# Patient Record
Sex: Female | Born: 1935 | Race: White | Hispanic: No | Smoking: Never smoker
Health system: Southern US, Community
[De-identification: ages and names within clinical notes are randomized; demographics above are authoritative.]

## PROBLEM LIST (undated history)

## (undated) DIAGNOSIS — Z86010 Personal history of colonic polyps: Secondary | ICD-10-CM

## (undated) DIAGNOSIS — K8689 Other specified diseases of pancreas: Secondary | ICD-10-CM

## (undated) DIAGNOSIS — M810 Age-related osteoporosis without current pathological fracture: Secondary | ICD-10-CM

## (undated) DIAGNOSIS — H269 Unspecified cataract: Secondary | ICD-10-CM

## (undated) DIAGNOSIS — E785 Hyperlipidemia, unspecified: Secondary | ICD-10-CM

## (undated) DIAGNOSIS — I8390 Asymptomatic varicose veins of unspecified lower extremity: Secondary | ICD-10-CM

## (undated) DIAGNOSIS — H348192 Central retinal vein occlusion, unspecified eye, stable: Secondary | ICD-10-CM

## (undated) DIAGNOSIS — I10 Essential (primary) hypertension: Secondary | ICD-10-CM

## (undated) DIAGNOSIS — R011 Cardiac murmur, unspecified: Secondary | ICD-10-CM

## (undated) DIAGNOSIS — Z808 Family history of malignant neoplasm of other organs or systems: Secondary | ICD-10-CM

## (undated) DIAGNOSIS — R0609 Other forms of dyspnea: Secondary | ICD-10-CM

## (undated) DIAGNOSIS — R0600 Dyspnea, unspecified: Secondary | ICD-10-CM

## (undated) DIAGNOSIS — M1990 Unspecified osteoarthritis, unspecified site: Secondary | ICD-10-CM

## (undated) DIAGNOSIS — I350 Nonrheumatic aortic (valve) stenosis: Secondary | ICD-10-CM

## (undated) DIAGNOSIS — Z803 Family history of malignant neoplasm of breast: Secondary | ICD-10-CM

## (undated) DIAGNOSIS — C44310 Basal cell carcinoma of skin of unspecified parts of face: Secondary | ICD-10-CM

## (undated) DIAGNOSIS — E559 Vitamin D deficiency, unspecified: Secondary | ICD-10-CM

## (undated) DIAGNOSIS — Z800 Family history of malignant neoplasm of digestive organs: Secondary | ICD-10-CM

## (undated) DIAGNOSIS — H9192 Unspecified hearing loss, left ear: Secondary | ICD-10-CM

## (undated) DIAGNOSIS — I421 Obstructive hypertrophic cardiomyopathy: Secondary | ICD-10-CM

## (undated) HISTORY — PX: VAGINAL HYSTERECTOMY: SUR661

## (undated) HISTORY — DX: Other specified diseases of pancreas: K86.89

## (undated) HISTORY — DX: Unspecified hearing loss, left ear: H91.92

## (undated) HISTORY — DX: Central retinal vein occlusion, unspecified eye, stable: H34.8192

## (undated) HISTORY — PX: SHOULDER SURGERY: SHX246

## (undated) HISTORY — DX: Essential (primary) hypertension: I10

## (undated) HISTORY — DX: Vitamin D deficiency, unspecified: E55.9

## (undated) HISTORY — DX: Nonrheumatic aortic (valve) stenosis: I35.0

## (undated) HISTORY — DX: Asymptomatic varicose veins of unspecified lower extremity: I83.90

## (undated) HISTORY — PX: ELBOW SURGERY: SHX618

## (undated) HISTORY — DX: Basal cell carcinoma of skin of unspecified parts of face: C44.310

## (undated) HISTORY — DX: Family history of malignant neoplasm of digestive organs: Z80.0

## (undated) HISTORY — DX: Family history of malignant neoplasm of other organs or systems: Z80.8

## (undated) HISTORY — PX: EYE SURGERY: SHX253

## (undated) HISTORY — DX: Family history of malignant neoplasm of breast: Z80.3

---

## 1972-09-21 HISTORY — PX: EXTERNAL EAR SURGERY: SHX627

## 1986-09-21 HISTORY — PX: COLONOSCOPY W/ POLYPECTOMY: SHX1380

## 2000-04-21 HISTORY — PX: COLONOSCOPY: SHX174

## 2002-08-28 ENCOUNTER — Other Ambulatory Visit: Admission: RE | Admit: 2002-08-28 | Discharge: 2002-08-28 | Payer: Self-pay | Admitting: Family Medicine

## 2004-05-08 ENCOUNTER — Encounter: Admission: RE | Admit: 2004-05-08 | Discharge: 2004-07-21 | Payer: Self-pay | Admitting: *Deleted

## 2006-05-18 ENCOUNTER — Other Ambulatory Visit: Admission: RE | Admit: 2006-05-18 | Discharge: 2006-05-18 | Payer: Self-pay | Admitting: Family Medicine

## 2007-02-21 ENCOUNTER — Ambulatory Visit: Payer: Self-pay | Admitting: Internal Medicine

## 2009-01-15 ENCOUNTER — Ambulatory Visit: Payer: Self-pay | Admitting: Internal Medicine

## 2009-03-18 ENCOUNTER — Ambulatory Visit: Payer: Self-pay | Admitting: Unknown Physician Specialty

## 2010-01-07 ENCOUNTER — Ambulatory Visit: Payer: Self-pay | Admitting: Unknown Physician Specialty

## 2010-01-13 ENCOUNTER — Ambulatory Visit: Payer: Self-pay | Admitting: Unknown Physician Specialty

## 2010-02-06 ENCOUNTER — Ambulatory Visit: Payer: Self-pay | Admitting: Internal Medicine

## 2011-02-10 ENCOUNTER — Ambulatory Visit: Payer: Self-pay | Admitting: Internal Medicine

## 2011-05-11 ENCOUNTER — Ambulatory Visit: Payer: Self-pay | Admitting: Otolaryngology

## 2011-05-20 ENCOUNTER — Ambulatory Visit: Payer: Self-pay | Admitting: Otolaryngology

## 2011-09-24 DIAGNOSIS — H35359 Cystoid macular degeneration, unspecified eye: Secondary | ICD-10-CM | POA: Diagnosis not present

## 2011-09-25 DIAGNOSIS — IMO0002 Reserved for concepts with insufficient information to code with codable children: Secondary | ICD-10-CM | POA: Diagnosis not present

## 2011-09-25 DIAGNOSIS — R079 Chest pain, unspecified: Secondary | ICD-10-CM | POA: Diagnosis not present

## 2011-10-20 DIAGNOSIS — H35359 Cystoid macular degeneration, unspecified eye: Secondary | ICD-10-CM | POA: Diagnosis not present

## 2011-10-27 DIAGNOSIS — D235 Other benign neoplasm of skin of trunk: Secondary | ICD-10-CM | POA: Diagnosis not present

## 2011-10-27 DIAGNOSIS — D1801 Hemangioma of skin and subcutaneous tissue: Secondary | ICD-10-CM | POA: Diagnosis not present

## 2011-11-17 DIAGNOSIS — H3560 Retinal hemorrhage, unspecified eye: Secondary | ICD-10-CM | POA: Diagnosis not present

## 2011-11-17 DIAGNOSIS — E119 Type 2 diabetes mellitus without complications: Secondary | ICD-10-CM | POA: Diagnosis not present

## 2011-11-17 DIAGNOSIS — H35359 Cystoid macular degeneration, unspecified eye: Secondary | ICD-10-CM | POA: Diagnosis not present

## 2011-12-01 DIAGNOSIS — H35359 Cystoid macular degeneration, unspecified eye: Secondary | ICD-10-CM | POA: Diagnosis not present

## 2011-12-03 DIAGNOSIS — H9209 Otalgia, unspecified ear: Secondary | ICD-10-CM | POA: Diagnosis not present

## 2011-12-10 DIAGNOSIS — H348192 Central retinal vein occlusion, unspecified eye, stable: Secondary | ICD-10-CM | POA: Diagnosis not present

## 2012-01-20 DIAGNOSIS — M810 Age-related osteoporosis without current pathological fracture: Secondary | ICD-10-CM | POA: Diagnosis not present

## 2012-01-20 DIAGNOSIS — I10 Essential (primary) hypertension: Secondary | ICD-10-CM | POA: Diagnosis not present

## 2012-01-20 DIAGNOSIS — E785 Hyperlipidemia, unspecified: Secondary | ICD-10-CM | POA: Diagnosis not present

## 2012-01-20 DIAGNOSIS — M129 Arthropathy, unspecified: Secondary | ICD-10-CM | POA: Diagnosis not present

## 2012-01-25 DIAGNOSIS — E785 Hyperlipidemia, unspecified: Secondary | ICD-10-CM | POA: Diagnosis not present

## 2012-01-26 DIAGNOSIS — H348192 Central retinal vein occlusion, unspecified eye, stable: Secondary | ICD-10-CM | POA: Diagnosis not present

## 2012-01-26 DIAGNOSIS — H35359 Cystoid macular degeneration, unspecified eye: Secondary | ICD-10-CM | POA: Diagnosis not present

## 2012-02-02 DIAGNOSIS — N951 Menopausal and female climacteric states: Secondary | ICD-10-CM | POA: Diagnosis not present

## 2012-02-11 ENCOUNTER — Ambulatory Visit: Payer: Self-pay | Admitting: Internal Medicine

## 2012-02-11 DIAGNOSIS — Z1231 Encounter for screening mammogram for malignant neoplasm of breast: Secondary | ICD-10-CM | POA: Diagnosis not present

## 2012-02-11 DIAGNOSIS — R928 Other abnormal and inconclusive findings on diagnostic imaging of breast: Secondary | ICD-10-CM | POA: Diagnosis not present

## 2012-03-10 DIAGNOSIS — H35359 Cystoid macular degeneration, unspecified eye: Secondary | ICD-10-CM | POA: Diagnosis not present

## 2012-03-10 DIAGNOSIS — H348192 Central retinal vein occlusion, unspecified eye, stable: Secondary | ICD-10-CM | POA: Diagnosis not present

## 2012-04-12 DIAGNOSIS — H35359 Cystoid macular degeneration, unspecified eye: Secondary | ICD-10-CM | POA: Diagnosis not present

## 2012-05-24 DIAGNOSIS — H348192 Central retinal vein occlusion, unspecified eye, stable: Secondary | ICD-10-CM | POA: Diagnosis not present

## 2012-05-24 DIAGNOSIS — H35359 Cystoid macular degeneration, unspecified eye: Secondary | ICD-10-CM | POA: Diagnosis not present

## 2012-06-24 DIAGNOSIS — H348192 Central retinal vein occlusion, unspecified eye, stable: Secondary | ICD-10-CM | POA: Diagnosis not present

## 2012-06-24 DIAGNOSIS — H35359 Cystoid macular degeneration, unspecified eye: Secondary | ICD-10-CM | POA: Diagnosis not present

## 2012-07-25 DIAGNOSIS — I10 Essential (primary) hypertension: Secondary | ICD-10-CM | POA: Diagnosis not present

## 2012-07-25 DIAGNOSIS — H35359 Cystoid macular degeneration, unspecified eye: Secondary | ICD-10-CM | POA: Diagnosis not present

## 2012-08-02 DIAGNOSIS — I10 Essential (primary) hypertension: Secondary | ICD-10-CM | POA: Diagnosis not present

## 2012-08-02 DIAGNOSIS — E785 Hyperlipidemia, unspecified: Secondary | ICD-10-CM | POA: Diagnosis not present

## 2012-08-04 DIAGNOSIS — Q828 Other specified congenital malformations of skin: Secondary | ICD-10-CM | POA: Diagnosis not present

## 2012-08-04 DIAGNOSIS — M79609 Pain in unspecified limb: Secondary | ICD-10-CM | POA: Diagnosis not present

## 2012-08-04 DIAGNOSIS — M2040 Other hammer toe(s) (acquired), unspecified foot: Secondary | ICD-10-CM | POA: Diagnosis not present

## 2012-08-10 DIAGNOSIS — H8090 Unspecified otosclerosis, unspecified ear: Secondary | ICD-10-CM | POA: Diagnosis not present

## 2012-08-10 DIAGNOSIS — H6120 Impacted cerumen, unspecified ear: Secondary | ICD-10-CM | POA: Diagnosis not present

## 2012-08-22 DIAGNOSIS — H353290 Exudative age-related macular degeneration, unspecified eye, stage unspecified: Secondary | ICD-10-CM | POA: Diagnosis not present

## 2012-08-25 DIAGNOSIS — H2510 Age-related nuclear cataract, unspecified eye: Secondary | ICD-10-CM | POA: Diagnosis not present

## 2012-09-08 ENCOUNTER — Ambulatory Visit: Payer: Self-pay | Admitting: Ophthalmology

## 2012-09-08 DIAGNOSIS — H269 Unspecified cataract: Secondary | ICD-10-CM | POA: Diagnosis not present

## 2012-09-08 DIAGNOSIS — H2510 Age-related nuclear cataract, unspecified eye: Secondary | ICD-10-CM | POA: Diagnosis not present

## 2012-09-08 DIAGNOSIS — Z01810 Encounter for preprocedural cardiovascular examination: Secondary | ICD-10-CM | POA: Diagnosis not present

## 2012-09-08 DIAGNOSIS — Z01812 Encounter for preprocedural laboratory examination: Secondary | ICD-10-CM | POA: Diagnosis not present

## 2012-09-08 DIAGNOSIS — I10 Essential (primary) hypertension: Secondary | ICD-10-CM | POA: Diagnosis not present

## 2012-09-08 LAB — POTASSIUM: Potassium: 3.9 mmol/L (ref 3.5–5.1)

## 2012-09-26 ENCOUNTER — Ambulatory Visit: Payer: Self-pay | Admitting: Ophthalmology

## 2012-09-26 DIAGNOSIS — Z79899 Other long term (current) drug therapy: Secondary | ICD-10-CM | POA: Diagnosis not present

## 2012-09-26 DIAGNOSIS — M129 Arthropathy, unspecified: Secondary | ICD-10-CM | POA: Diagnosis not present

## 2012-09-26 DIAGNOSIS — M810 Age-related osteoporosis without current pathological fracture: Secondary | ICD-10-CM | POA: Diagnosis not present

## 2012-09-26 DIAGNOSIS — H2510 Age-related nuclear cataract, unspecified eye: Secondary | ICD-10-CM | POA: Diagnosis not present

## 2012-09-26 DIAGNOSIS — I10 Essential (primary) hypertension: Secondary | ICD-10-CM | POA: Diagnosis not present

## 2012-09-26 DIAGNOSIS — Z7982 Long term (current) use of aspirin: Secondary | ICD-10-CM | POA: Diagnosis not present

## 2012-09-26 DIAGNOSIS — Z85828 Personal history of other malignant neoplasm of skin: Secondary | ICD-10-CM | POA: Diagnosis not present

## 2012-09-26 DIAGNOSIS — Z9109 Other allergy status, other than to drugs and biological substances: Secondary | ICD-10-CM | POA: Diagnosis not present

## 2012-09-26 DIAGNOSIS — H269 Unspecified cataract: Secondary | ICD-10-CM | POA: Diagnosis not present

## 2012-09-26 DIAGNOSIS — R011 Cardiac murmur, unspecified: Secondary | ICD-10-CM | POA: Diagnosis not present

## 2012-09-26 DIAGNOSIS — E7800 Pure hypercholesterolemia, unspecified: Secondary | ICD-10-CM | POA: Diagnosis not present

## 2012-09-30 DIAGNOSIS — H35359 Cystoid macular degeneration, unspecified eye: Secondary | ICD-10-CM | POA: Diagnosis not present

## 2012-10-17 DIAGNOSIS — D485 Neoplasm of uncertain behavior of skin: Secondary | ICD-10-CM | POA: Diagnosis not present

## 2012-11-11 DIAGNOSIS — H35359 Cystoid macular degeneration, unspecified eye: Secondary | ICD-10-CM | POA: Diagnosis not present

## 2012-12-09 DIAGNOSIS — H35359 Cystoid macular degeneration, unspecified eye: Secondary | ICD-10-CM | POA: Diagnosis not present

## 2013-01-04 DIAGNOSIS — H35359 Cystoid macular degeneration, unspecified eye: Secondary | ICD-10-CM | POA: Diagnosis not present

## 2013-01-13 DIAGNOSIS — E785 Hyperlipidemia, unspecified: Secondary | ICD-10-CM | POA: Diagnosis not present

## 2013-01-20 DIAGNOSIS — M129 Arthropathy, unspecified: Secondary | ICD-10-CM | POA: Diagnosis not present

## 2013-01-20 DIAGNOSIS — E785 Hyperlipidemia, unspecified: Secondary | ICD-10-CM | POA: Diagnosis not present

## 2013-01-20 DIAGNOSIS — I10 Essential (primary) hypertension: Secondary | ICD-10-CM | POA: Diagnosis not present

## 2013-02-08 DIAGNOSIS — H35359 Cystoid macular degeneration, unspecified eye: Secondary | ICD-10-CM | POA: Diagnosis not present

## 2013-02-22 DIAGNOSIS — H348192 Central retinal vein occlusion, unspecified eye, stable: Secondary | ICD-10-CM | POA: Diagnosis not present

## 2013-03-22 DIAGNOSIS — H35359 Cystoid macular degeneration, unspecified eye: Secondary | ICD-10-CM | POA: Diagnosis not present

## 2013-05-02 DIAGNOSIS — H35359 Cystoid macular degeneration, unspecified eye: Secondary | ICD-10-CM | POA: Diagnosis not present

## 2013-06-13 DIAGNOSIS — H348192 Central retinal vein occlusion, unspecified eye, stable: Secondary | ICD-10-CM | POA: Diagnosis not present

## 2013-06-13 DIAGNOSIS — Z23 Encounter for immunization: Secondary | ICD-10-CM | POA: Diagnosis not present

## 2013-07-25 DIAGNOSIS — H348192 Central retinal vein occlusion, unspecified eye, stable: Secondary | ICD-10-CM | POA: Diagnosis not present

## 2013-07-25 DIAGNOSIS — E785 Hyperlipidemia, unspecified: Secondary | ICD-10-CM | POA: Diagnosis not present

## 2013-07-25 DIAGNOSIS — I10 Essential (primary) hypertension: Secondary | ICD-10-CM | POA: Diagnosis not present

## 2013-08-04 DIAGNOSIS — M810 Age-related osteoporosis without current pathological fracture: Secondary | ICD-10-CM | POA: Diagnosis not present

## 2013-08-04 DIAGNOSIS — I10 Essential (primary) hypertension: Secondary | ICD-10-CM | POA: Diagnosis not present

## 2013-08-04 DIAGNOSIS — I83893 Varicose veins of bilateral lower extremities with other complications: Secondary | ICD-10-CM | POA: Diagnosis not present

## 2013-08-07 DIAGNOSIS — H348192 Central retinal vein occlusion, unspecified eye, stable: Secondary | ICD-10-CM | POA: Diagnosis not present

## 2013-08-07 DIAGNOSIS — H35359 Cystoid macular degeneration, unspecified eye: Secondary | ICD-10-CM | POA: Diagnosis not present

## 2013-09-22 DIAGNOSIS — H348192 Central retinal vein occlusion, unspecified eye, stable: Secondary | ICD-10-CM | POA: Diagnosis not present

## 2013-09-22 DIAGNOSIS — H35359 Cystoid macular degeneration, unspecified eye: Secondary | ICD-10-CM | POA: Diagnosis not present

## 2013-10-02 DIAGNOSIS — M25569 Pain in unspecified knee: Secondary | ICD-10-CM | POA: Diagnosis not present

## 2013-10-02 DIAGNOSIS — M810 Age-related osteoporosis without current pathological fracture: Secondary | ICD-10-CM | POA: Diagnosis not present

## 2013-10-02 DIAGNOSIS — E7800 Pure hypercholesterolemia, unspecified: Secondary | ICD-10-CM | POA: Diagnosis not present

## 2013-10-02 DIAGNOSIS — I10 Essential (primary) hypertension: Secondary | ICD-10-CM | POA: Diagnosis not present

## 2013-10-02 DIAGNOSIS — E559 Vitamin D deficiency, unspecified: Secondary | ICD-10-CM | POA: Diagnosis not present

## 2013-10-12 DIAGNOSIS — L570 Actinic keratosis: Secondary | ICD-10-CM | POA: Diagnosis not present

## 2013-10-12 DIAGNOSIS — L820 Inflamed seborrheic keratosis: Secondary | ICD-10-CM | POA: Diagnosis not present

## 2013-10-12 DIAGNOSIS — H61009 Unspecified perichondritis of external ear, unspecified ear: Secondary | ICD-10-CM | POA: Diagnosis not present

## 2013-10-30 DIAGNOSIS — H348192 Central retinal vein occlusion, unspecified eye, stable: Secondary | ICD-10-CM | POA: Diagnosis not present

## 2013-10-30 DIAGNOSIS — H35359 Cystoid macular degeneration, unspecified eye: Secondary | ICD-10-CM | POA: Diagnosis not present

## 2013-12-05 DIAGNOSIS — H348192 Central retinal vein occlusion, unspecified eye, stable: Secondary | ICD-10-CM | POA: Diagnosis not present

## 2013-12-14 DIAGNOSIS — L570 Actinic keratosis: Secondary | ICD-10-CM | POA: Diagnosis not present

## 2013-12-14 DIAGNOSIS — L821 Other seborrheic keratosis: Secondary | ICD-10-CM | POA: Diagnosis not present

## 2013-12-14 DIAGNOSIS — D235 Other benign neoplasm of skin of trunk: Secondary | ICD-10-CM | POA: Diagnosis not present

## 2013-12-14 DIAGNOSIS — D1801 Hemangioma of skin and subcutaneous tissue: Secondary | ICD-10-CM | POA: Diagnosis not present

## 2014-01-16 DIAGNOSIS — H353290 Exudative age-related macular degeneration, unspecified eye, stage unspecified: Secondary | ICD-10-CM | POA: Diagnosis not present

## 2014-02-05 DIAGNOSIS — E785 Hyperlipidemia, unspecified: Secondary | ICD-10-CM | POA: Diagnosis not present

## 2014-02-05 DIAGNOSIS — Z79899 Other long term (current) drug therapy: Secondary | ICD-10-CM | POA: Diagnosis not present

## 2014-02-13 DIAGNOSIS — I10 Essential (primary) hypertension: Secondary | ICD-10-CM | POA: Diagnosis not present

## 2014-02-13 DIAGNOSIS — Z79899 Other long term (current) drug therapy: Secondary | ICD-10-CM | POA: Diagnosis not present

## 2014-02-13 DIAGNOSIS — Z0000 Encounter for general adult medical examination without abnormal findings: Secondary | ICD-10-CM | POA: Diagnosis not present

## 2014-02-13 DIAGNOSIS — M25569 Pain in unspecified knee: Secondary | ICD-10-CM | POA: Diagnosis not present

## 2014-02-26 DIAGNOSIS — H348192 Central retinal vein occlusion, unspecified eye, stable: Secondary | ICD-10-CM | POA: Diagnosis not present

## 2014-02-27 DIAGNOSIS — M1710 Unilateral primary osteoarthritis, unspecified knee: Secondary | ICD-10-CM | POA: Diagnosis not present

## 2014-02-28 DIAGNOSIS — M1710 Unilateral primary osteoarthritis, unspecified knee: Secondary | ICD-10-CM | POA: Diagnosis not present

## 2014-02-28 DIAGNOSIS — M7120 Synovial cyst of popliteal space [Baker], unspecified knee: Secondary | ICD-10-CM | POA: Diagnosis not present

## 2014-03-06 DIAGNOSIS — H353190 Nonexudative age-related macular degeneration, unspecified eye, stage unspecified: Secondary | ICD-10-CM | POA: Diagnosis not present

## 2014-03-28 DIAGNOSIS — M549 Dorsalgia, unspecified: Secondary | ICD-10-CM | POA: Diagnosis not present

## 2014-03-28 DIAGNOSIS — M7120 Synovial cyst of popliteal space [Baker], unspecified knee: Secondary | ICD-10-CM | POA: Diagnosis not present

## 2014-03-28 DIAGNOSIS — M1710 Unilateral primary osteoarthritis, unspecified knee: Secondary | ICD-10-CM | POA: Diagnosis not present

## 2014-04-05 DIAGNOSIS — M1710 Unilateral primary osteoarthritis, unspecified knee: Secondary | ICD-10-CM | POA: Diagnosis not present

## 2014-04-12 DIAGNOSIS — M1710 Unilateral primary osteoarthritis, unspecified knee: Secondary | ICD-10-CM | POA: Diagnosis not present

## 2014-04-16 DIAGNOSIS — H348192 Central retinal vein occlusion, unspecified eye, stable: Secondary | ICD-10-CM | POA: Diagnosis not present

## 2014-05-02 DIAGNOSIS — M5137 Other intervertebral disc degeneration, lumbosacral region: Secondary | ICD-10-CM | POA: Diagnosis not present

## 2014-05-02 DIAGNOSIS — M549 Dorsalgia, unspecified: Secondary | ICD-10-CM | POA: Diagnosis not present

## 2014-05-02 DIAGNOSIS — M4120 Other idiopathic scoliosis, site unspecified: Secondary | ICD-10-CM | POA: Diagnosis not present

## 2014-05-15 DIAGNOSIS — L259 Unspecified contact dermatitis, unspecified cause: Secondary | ICD-10-CM | POA: Diagnosis not present

## 2014-05-15 DIAGNOSIS — L255 Unspecified contact dermatitis due to plants, except food: Secondary | ICD-10-CM | POA: Diagnosis not present

## 2014-05-16 DIAGNOSIS — G8929 Other chronic pain: Secondary | ICD-10-CM | POA: Diagnosis not present

## 2014-05-16 DIAGNOSIS — M549 Dorsalgia, unspecified: Secondary | ICD-10-CM | POA: Diagnosis not present

## 2014-05-21 DIAGNOSIS — G8929 Other chronic pain: Secondary | ICD-10-CM | POA: Diagnosis not present

## 2014-05-21 DIAGNOSIS — M549 Dorsalgia, unspecified: Secondary | ICD-10-CM | POA: Diagnosis not present

## 2014-05-24 DIAGNOSIS — M549 Dorsalgia, unspecified: Secondary | ICD-10-CM | POA: Diagnosis not present

## 2014-05-24 DIAGNOSIS — G8929 Other chronic pain: Secondary | ICD-10-CM | POA: Diagnosis not present

## 2014-05-25 DIAGNOSIS — H348192 Central retinal vein occlusion, unspecified eye, stable: Secondary | ICD-10-CM | POA: Diagnosis not present

## 2014-05-30 DIAGNOSIS — M1710 Unilateral primary osteoarthritis, unspecified knee: Secondary | ICD-10-CM | POA: Diagnosis not present

## 2014-05-31 DIAGNOSIS — M549 Dorsalgia, unspecified: Secondary | ICD-10-CM | POA: Diagnosis not present

## 2014-05-31 DIAGNOSIS — G8929 Other chronic pain: Secondary | ICD-10-CM | POA: Diagnosis not present

## 2014-06-01 DIAGNOSIS — M549 Dorsalgia, unspecified: Secondary | ICD-10-CM | POA: Diagnosis not present

## 2014-06-01 DIAGNOSIS — G8929 Other chronic pain: Secondary | ICD-10-CM | POA: Diagnosis not present

## 2014-06-04 DIAGNOSIS — M4120 Other idiopathic scoliosis, site unspecified: Secondary | ICD-10-CM | POA: Diagnosis not present

## 2014-06-04 DIAGNOSIS — M5137 Other intervertebral disc degeneration, lumbosacral region: Secondary | ICD-10-CM | POA: Diagnosis not present

## 2014-06-04 DIAGNOSIS — M549 Dorsalgia, unspecified: Secondary | ICD-10-CM | POA: Diagnosis not present

## 2014-06-05 DIAGNOSIS — I10 Essential (primary) hypertension: Secondary | ICD-10-CM | POA: Diagnosis not present

## 2014-06-05 DIAGNOSIS — M810 Age-related osteoporosis without current pathological fracture: Secondary | ICD-10-CM | POA: Diagnosis not present

## 2014-06-05 DIAGNOSIS — M1990 Unspecified osteoarthritis, unspecified site: Secondary | ICD-10-CM | POA: Diagnosis not present

## 2014-06-05 DIAGNOSIS — Z23 Encounter for immunization: Secondary | ICD-10-CM | POA: Diagnosis not present

## 2014-06-05 DIAGNOSIS — E7800 Pure hypercholesterolemia, unspecified: Secondary | ICD-10-CM | POA: Diagnosis not present

## 2014-06-05 DIAGNOSIS — E559 Vitamin D deficiency, unspecified: Secondary | ICD-10-CM | POA: Diagnosis not present

## 2014-06-05 DIAGNOSIS — Z0000 Encounter for general adult medical examination without abnormal findings: Secondary | ICD-10-CM | POA: Diagnosis not present

## 2014-07-17 DIAGNOSIS — H43813 Vitreous degeneration, bilateral: Secondary | ICD-10-CM | POA: Diagnosis not present

## 2014-07-17 DIAGNOSIS — H35352 Cystoid macular degeneration, left eye: Secondary | ICD-10-CM | POA: Diagnosis not present

## 2014-07-17 DIAGNOSIS — H34812 Central retinal vein occlusion, left eye: Secondary | ICD-10-CM | POA: Diagnosis not present

## 2014-07-17 DIAGNOSIS — H3531 Nonexudative age-related macular degeneration: Secondary | ICD-10-CM | POA: Diagnosis not present

## 2014-08-05 DIAGNOSIS — J029 Acute pharyngitis, unspecified: Secondary | ICD-10-CM | POA: Diagnosis not present

## 2014-08-30 DIAGNOSIS — H35352 Cystoid macular degeneration, left eye: Secondary | ICD-10-CM | POA: Diagnosis not present

## 2014-08-30 DIAGNOSIS — H34812 Central retinal vein occlusion, left eye: Secondary | ICD-10-CM | POA: Diagnosis not present

## 2014-09-05 DIAGNOSIS — H3531 Nonexudative age-related macular degeneration: Secondary | ICD-10-CM | POA: Diagnosis not present

## 2014-10-11 DIAGNOSIS — H35352 Cystoid macular degeneration, left eye: Secondary | ICD-10-CM | POA: Diagnosis not present

## 2014-10-11 DIAGNOSIS — H34812 Central retinal vein occlusion, left eye: Secondary | ICD-10-CM | POA: Diagnosis not present

## 2014-11-22 DIAGNOSIS — H35352 Cystoid macular degeneration, left eye: Secondary | ICD-10-CM | POA: Diagnosis not present

## 2014-11-22 DIAGNOSIS — H34812 Central retinal vein occlusion, left eye: Secondary | ICD-10-CM | POA: Diagnosis not present

## 2015-01-10 DIAGNOSIS — H34811 Central retinal vein occlusion, right eye: Secondary | ICD-10-CM | POA: Diagnosis not present

## 2015-01-11 NOTE — Op Note (Signed)
PATIENT NAME:  Tiffany [name], Tiffany [name] MR#:  [id number] DATE OF BIRTH:  06/14/1926  DATE OF PROCEDURE:  09/26/2012  PREOPERATIVE DIAGNOSIS: Cataract, right eye.  POSTOPERATIVE DIAGNOSIS: Cataract, right eye.   PROCEDURE PERFORMED: Extracapsular cataract extraction using phacoemulsification with placement of Alcon [id number], 23.5 diopter posterior chamber lens with 2.25 diopters of cylinder, serial number [id number].   SURGEON: Loura Back. Makena Mcgrady, M.D.   ANESTHESIA: 4% lidocaine and 0.75% Marcaine a 50-50 mixture with 10 units/mL of Hylenex added, given as a peribulbar.   ANESTHESIOLOGIST: Dr. Andree Elk.   COMPLICATIONS: None.   ESTIMATED BLOOD LOSS: Less than 1 mL.   DESCRIPTION OF PROCEDURE: The patient was brought to the operating room and each eye was anesthetized with topical proparacaine. Sitting upright, the 3 and 9 o'clock positions were marked using an ASCICO toric marker. The patient was placed supine, and the marks were reinforced with a marking pen. She was given IV sedation and a peribulbar block. She was prepped and draped in the usual fashion. Vertical rectus muscles were imbricated using 5 silk sutures, bridle sutures. The toric degree marker was brought to the table and centered over the 3 and 9 o'clock positions. The 90 degree position was marked just inside the cornea. A small conjunctival peritomy was carried out for one clock hour at 12 o'clock. Hemostasis was obtained with cautery. A partial thickness scleral groove was made posterior to the surgical limbus and dissected anteriorly through clear cornea with an Alcon crescent knife. The anterior chamber was entered superonasally through clear cornea with a paracentesis knife. The eye was entered through the lamellar dissection with a 2.6 mm keratome. DisCoVisc was used to place the aqueous. A continuous tear circular capsulorrhexis was carried out. Hydrodissection was used to loosen the nucleus and phacoemulsification was carried out in a  divide and conquer technique. Ultrasound time was 255.9 seconds with an average power of 22 and a CDE of 63.1. Irrigation and aspiration was used to remove the residual cortex. The capsular bag was inflated with DisCoVisc and the intraocular lens inserted in the capsular bag under direct visualization. The lens marks were rotated to be just slightly beyond 90 degrees at 89 degrees, which was the intended target. Irrigation-aspiration was used to remove the residual DisCoVisc. The wound was inflated with balanced salt and Miostat was injected through the paracentesis track. Cefuroxime 10 mL was injected through the paracentesis track as well. The wound was checked for leaks. None were found. The conjunctiva was closed with cautery. The bridle sutures were removed. Two drops of Vigamox were placed in the eye. A shield was placed on the eye. The patient was discharged to the recovery room in good condition.   ____________________________ Loura Back Olita Takeshita, MD sad:aw D: 09/26/2012 13:53:14 ET T: 09/27/2012 06:24:59 ET JOB#: [id number]  cc: Remo Lipps A. Terrina Docter, MD, <Dictator> Martie Lee MD ELECTRONICALLY SIGNED 10/03/2012 13:10

## 2015-01-21 DIAGNOSIS — D225 Melanocytic nevi of trunk: Secondary | ICD-10-CM | POA: Diagnosis not present

## 2015-01-21 DIAGNOSIS — L814 Other melanin hyperpigmentation: Secondary | ICD-10-CM | POA: Diagnosis not present

## 2015-01-21 DIAGNOSIS — L821 Other seborrheic keratosis: Secondary | ICD-10-CM | POA: Diagnosis not present

## 2015-01-21 DIAGNOSIS — L820 Inflamed seborrheic keratosis: Secondary | ICD-10-CM | POA: Diagnosis not present

## 2015-01-22 DIAGNOSIS — M1712 Unilateral primary osteoarthritis, left knee: Secondary | ICD-10-CM | POA: Diagnosis not present

## 2015-01-22 DIAGNOSIS — M25562 Pain in left knee: Secondary | ICD-10-CM | POA: Diagnosis not present

## 2015-02-25 DIAGNOSIS — M1712 Unilateral primary osteoarthritis, left knee: Secondary | ICD-10-CM | POA: Diagnosis not present

## 2015-03-04 DIAGNOSIS — M1712 Unilateral primary osteoarthritis, left knee: Secondary | ICD-10-CM | POA: Diagnosis not present

## 2015-03-05 DIAGNOSIS — Z961 Presence of intraocular lens: Secondary | ICD-10-CM | POA: Diagnosis not present

## 2015-03-07 DIAGNOSIS — H34812 Central retinal vein occlusion, left eye: Secondary | ICD-10-CM | POA: Diagnosis not present

## 2015-03-07 DIAGNOSIS — H3531 Nonexudative age-related macular degeneration: Secondary | ICD-10-CM | POA: Diagnosis not present

## 2015-03-11 DIAGNOSIS — M1712 Unilateral primary osteoarthritis, left knee: Secondary | ICD-10-CM | POA: Diagnosis not present

## 2015-04-22 DIAGNOSIS — M1712 Unilateral primary osteoarthritis, left knee: Secondary | ICD-10-CM | POA: Diagnosis not present

## 2015-05-02 DIAGNOSIS — H34812 Central retinal vein occlusion, left eye: Secondary | ICD-10-CM | POA: Diagnosis not present

## 2015-06-12 DIAGNOSIS — K649 Unspecified hemorrhoids: Secondary | ICD-10-CM | POA: Diagnosis not present

## 2015-06-12 DIAGNOSIS — E780 Pure hypercholesterolemia: Secondary | ICD-10-CM | POA: Diagnosis not present

## 2015-06-12 DIAGNOSIS — E559 Vitamin D deficiency, unspecified: Secondary | ICD-10-CM | POA: Diagnosis not present

## 2015-06-12 DIAGNOSIS — M810 Age-related osteoporosis without current pathological fracture: Secondary | ICD-10-CM | POA: Diagnosis not present

## 2015-06-12 DIAGNOSIS — I10 Essential (primary) hypertension: Secondary | ICD-10-CM | POA: Diagnosis not present

## 2015-06-12 DIAGNOSIS — Z0001 Encounter for general adult medical examination with abnormal findings: Secondary | ICD-10-CM | POA: Diagnosis not present

## 2015-06-12 DIAGNOSIS — Z23 Encounter for immunization: Secondary | ICD-10-CM | POA: Diagnosis not present

## 2015-06-12 DIAGNOSIS — M179 Osteoarthritis of knee, unspecified: Secondary | ICD-10-CM | POA: Diagnosis not present

## 2015-06-14 DIAGNOSIS — I10 Essential (primary) hypertension: Secondary | ICD-10-CM | POA: Diagnosis not present

## 2015-06-14 DIAGNOSIS — M179 Osteoarthritis of knee, unspecified: Secondary | ICD-10-CM | POA: Diagnosis not present

## 2015-06-14 DIAGNOSIS — K649 Unspecified hemorrhoids: Secondary | ICD-10-CM | POA: Diagnosis not present

## 2015-06-14 DIAGNOSIS — E780 Pure hypercholesterolemia: Secondary | ICD-10-CM | POA: Diagnosis not present

## 2015-06-14 DIAGNOSIS — Z0001 Encounter for general adult medical examination with abnormal findings: Secondary | ICD-10-CM | POA: Diagnosis not present

## 2015-06-14 DIAGNOSIS — M810 Age-related osteoporosis without current pathological fracture: Secondary | ICD-10-CM | POA: Diagnosis not present

## 2015-06-14 DIAGNOSIS — Z23 Encounter for immunization: Secondary | ICD-10-CM | POA: Diagnosis not present

## 2015-06-14 DIAGNOSIS — E559 Vitamin D deficiency, unspecified: Secondary | ICD-10-CM | POA: Diagnosis not present

## 2015-06-27 DIAGNOSIS — H34812 Central retinal vein occlusion, left eye, with macular edema: Secondary | ICD-10-CM | POA: Diagnosis not present

## 2015-07-10 DIAGNOSIS — L309 Dermatitis, unspecified: Secondary | ICD-10-CM | POA: Diagnosis not present

## 2015-07-10 DIAGNOSIS — L821 Other seborrheic keratosis: Secondary | ICD-10-CM | POA: Diagnosis not present

## 2015-09-05 DIAGNOSIS — H34812 Central retinal vein occlusion, left eye, with macular edema: Secondary | ICD-10-CM | POA: Diagnosis not present

## 2015-09-10 DIAGNOSIS — Z961 Presence of intraocular lens: Secondary | ICD-10-CM | POA: Diagnosis not present

## 2015-11-07 DIAGNOSIS — H353112 Nonexudative age-related macular degeneration, right eye, intermediate dry stage: Secondary | ICD-10-CM | POA: Diagnosis not present

## 2015-11-07 DIAGNOSIS — H34812 Central retinal vein occlusion, left eye, with macular edema: Secondary | ICD-10-CM | POA: Diagnosis not present

## 2015-11-07 DIAGNOSIS — H353122 Nonexudative age-related macular degeneration, left eye, intermediate dry stage: Secondary | ICD-10-CM | POA: Diagnosis not present

## 2015-12-02 DIAGNOSIS — R071 Chest pain on breathing: Secondary | ICD-10-CM | POA: Diagnosis not present

## 2015-12-04 DIAGNOSIS — L820 Inflamed seborrheic keratosis: Secondary | ICD-10-CM | POA: Diagnosis not present

## 2015-12-04 DIAGNOSIS — L821 Other seborrheic keratosis: Secondary | ICD-10-CM | POA: Diagnosis not present

## 2016-01-09 DIAGNOSIS — H34812 Central retinal vein occlusion, left eye, with macular edema: Secondary | ICD-10-CM | POA: Diagnosis not present

## 2016-01-20 DIAGNOSIS — D235 Other benign neoplasm of skin of trunk: Secondary | ICD-10-CM | POA: Diagnosis not present

## 2016-01-20 DIAGNOSIS — L570 Actinic keratosis: Secondary | ICD-10-CM | POA: Diagnosis not present

## 2016-01-20 DIAGNOSIS — L821 Other seborrheic keratosis: Secondary | ICD-10-CM | POA: Diagnosis not present

## 2016-01-20 DIAGNOSIS — L812 Freckles: Secondary | ICD-10-CM | POA: Diagnosis not present

## 2016-03-05 DIAGNOSIS — H34812 Central retinal vein occlusion, left eye, with macular edema: Secondary | ICD-10-CM | POA: Diagnosis not present

## 2016-03-23 DIAGNOSIS — L820 Inflamed seborrheic keratosis: Secondary | ICD-10-CM | POA: Diagnosis not present

## 2016-03-23 DIAGNOSIS — L821 Other seborrheic keratosis: Secondary | ICD-10-CM | POA: Diagnosis not present

## 2016-04-22 DIAGNOSIS — R011 Cardiac murmur, unspecified: Secondary | ICD-10-CM | POA: Diagnosis not present

## 2016-04-22 DIAGNOSIS — I10 Essential (primary) hypertension: Secondary | ICD-10-CM | POA: Diagnosis not present

## 2016-04-22 DIAGNOSIS — G479 Sleep disorder, unspecified: Secondary | ICD-10-CM | POA: Diagnosis not present

## 2016-04-22 DIAGNOSIS — E559 Vitamin D deficiency, unspecified: Secondary | ICD-10-CM | POA: Diagnosis not present

## 2016-04-22 DIAGNOSIS — M25559 Pain in unspecified hip: Secondary | ICD-10-CM | POA: Diagnosis not present

## 2016-04-22 DIAGNOSIS — E7800 Pure hypercholesterolemia, unspecified: Secondary | ICD-10-CM | POA: Diagnosis not present

## 2016-04-22 DIAGNOSIS — R0609 Other forms of dyspnea: Secondary | ICD-10-CM | POA: Diagnosis not present

## 2016-05-16 DIAGNOSIS — M5136 Other intervertebral disc degeneration, lumbar region: Secondary | ICD-10-CM | POA: Diagnosis not present

## 2016-05-28 DIAGNOSIS — H353122 Nonexudative age-related macular degeneration, left eye, intermediate dry stage: Secondary | ICD-10-CM | POA: Diagnosis not present

## 2016-05-28 DIAGNOSIS — H34812 Central retinal vein occlusion, left eye, with macular edema: Secondary | ICD-10-CM | POA: Diagnosis not present

## 2016-05-28 DIAGNOSIS — H353112 Nonexudative age-related macular degeneration, right eye, intermediate dry stage: Secondary | ICD-10-CM | POA: Diagnosis not present

## 2016-06-05 ENCOUNTER — Encounter: Payer: Self-pay | Admitting: Interventional Cardiology

## 2016-06-09 DIAGNOSIS — M5136 Other intervertebral disc degeneration, lumbar region: Secondary | ICD-10-CM | POA: Diagnosis not present

## 2016-06-16 DIAGNOSIS — R0602 Shortness of breath: Secondary | ICD-10-CM | POA: Insufficient documentation

## 2016-06-18 ENCOUNTER — Ambulatory Visit (INDEPENDENT_AMBULATORY_CARE_PROVIDER_SITE_OTHER): Payer: Medicare Other | Admitting: Interventional Cardiology

## 2016-06-18 ENCOUNTER — Encounter: Payer: Self-pay | Admitting: Interventional Cardiology

## 2016-06-18 VITALS — BP 134/66 | HR 72 | Ht 63.5 in | Wt 153.1 lb

## 2016-06-18 DIAGNOSIS — R011 Cardiac murmur, unspecified: Secondary | ICD-10-CM | POA: Diagnosis not present

## 2016-06-18 DIAGNOSIS — R0609 Other forms of dyspnea: Secondary | ICD-10-CM | POA: Diagnosis not present

## 2016-06-18 NOTE — Patient Instructions (Signed)
Medication Instructions:  None  Labwork: None  Testing/Procedures: Your physician has requested that you have an exercise tolerance test. For further information please visit [email or web address]. Please also follow instruction sheet, as given.  Your physician has requested that you have an echocardiogram. Echocardiography is a painless test that uses sound waves to create images of your heart. It provides your doctor with information about the size and shape of your heart and how well your heart's chambers and valves are working. This procedure takes approximately one hour. There are no restrictions for this procedure.   Follow-Up: Your physician recommends that you schedule a follow-up appointment in: 4-6 weeks with Dr. Tamala Julian.   Any Other Special Instructions Will Be Listed Below (If Applicable).     If you need a refill on your cardiac medications before your next appointment, please call your pharmacy.

## 2016-06-18 NOTE — Progress Notes (Signed)
Cardiology Office Note    Date:  06/18/2016   ID:  Tiffany [name], DOB April 20, 1926, MRN [id number]  PCP:   Melinda Crutch, MD  Cardiologist: Sinclair Grooms, MD   Chief Complaint  Patient presents with  . Shortness of Breath    History of Present Illness:  Tiffany [name] is a 82 y.o. female for evaluation of dyspnea on exertion.  She is very active 82 year old who for the past 6-12 months is noted increasing dyspnea on exertion. She remains active. She exercises daily. She realizes that she has increasing discomfort with activity. This concerns her. She spoke to Dr. Harrington Challenger about this complaint and he referred her for evaluation. He felt that she needs a stress test. Also had concern about the presence of a murmur.  Past Medical History:  Diagnosis Date  . Aortic stenosis, mild    ECHO 09  . Colon polyps    benign  . Facial basal cell cancer    nose-/dr, Allyson Sabal  . Hearing loss in left ear   . Hypertension   . Retinal vein occlusion    Dr. Starling Manns  . Varicose veins   . Vitamin D deficiency     Past Surgical History:  Procedure Laterality Date  . EXTERNAL EAR SURGERY Right 1974  . EYE SURGERY Bilateral    cataract  . VAGINAL HYSTERECTOMY     without oopherectomy for birth control age 69    Current Medications: Outpatient Medications Prior to Visit  Medication Sig Dispense Refill  . amLODipine (NORVASC) 5 MG tablet Take 5 mg by mouth daily.    Marland Kitchen aspirin EC 81 MG tablet Take 81 mg by mouth daily.    . Cholecalciferol (VITAMIN D) 2000 units CAPS Take 1 capsule by mouth daily.    Marland Kitchen lisinopril-hydrochlorothiazide (PRINZIDE,ZESTORETIC) 20-25 MG tablet Take 1 tablet by mouth daily.    . Multiple Vitamins-Minerals (PRESERVISION AREDS) CAPS Take 1 capsule by mouth daily.    . raloxifene (EVISTA) 60 MG tablet Take 60 mg by mouth daily.    . Glucosamine-Chondroitin (OSTEO BI-FLEX REGULAR STRENGTH) 250-200 MG TABS Take 1 tablet by mouth daily.    . hydrocortisone (ANUSOL-HC) 25  MG suppository Place 25 mg rectally 2 (two) times daily.     No facility-administered medications prior to visit.      Allergies:   Hydrocodone-homatropine   Social History   Social History  . Marital status: Divorced    Spouse name: N/A  . Number of children: 5  . Years of education: college   Occupational History  . retired    Social History Main Topics  . Smoking status: Never Smoker  . Smokeless tobacco: Never Used  . Alcohol use Yes  . Drug use: No  . Sexual activity: Not Asked   Other Topics Concern  . None   Social History Narrative  . None     Family History:  The patient's family history includes Breast cancer in her sister; CAD in her father; Cerebral aneurysm in her mother; Colon cancer in her brother; Hypertension in her brother, mother, son, and son; Melanoma in her mother.   ROS:   Please see the history of present illness.    Excessive fatigue. Sleeps well. Denies snoring.  All other systems reviewed and are negative.   PHYSICAL EXAM:   VS:  BP 134/66 (BP Location: Left Arm)   Pulse 72   Ht 5' 3.5" (1.613 m)   Wt 153 lb 1.9 oz (69.5  kg)   BMI 26.70 kg/m    GEN: Well nourished, well developed, in no acute distress  HEENT: normal  Neck: no JVD, carotid bruits, or masses Cardiac: RRR; There is a 2/6 left lower sternal border and apical systolic murmur.  No rub or gallop, or edema . Respiratory:  clear to auscultation bilaterally, normal work of breathing GI: soft, nontender, nondistended, + BS MS: no deformity or atrophy  Skin: warm and dry, no rash Neuro:  Alert and Oriented x 3, Strength and sensation are intact Psych: euthymic mood, full affect  Wt Readings from Last 3 Encounters:  06/18/16 153 lb 1.9 oz (69.5 kg)      Studies/Labs Reviewed:   EKG:  EKG  Normal sinus rhythm, left atrial abnormality, old otherwise unremarkable.  Recent Labs: No results found for requested labs within last 8760 hours.   Lipid Panel No results found  for: CHOL, TRIG, HDL, CHOLHDL, VLDL, LDLCALC, LDLDIRECT  Additional studies/ records that were reviewed today include:  No new clinical data discovered in the [facility] records.    ASSESSMENT:    1. Murmur   2. DOE (dyspnea on exertion)      PLAN:  In order of problems listed above:  1. Suspect calcified mitral annulus. Rule out mitral regurgitation. 2-D Doppler echocardiogram will be performed. 2. Stress myocardial perfusion imaging will be performed to exclude coronary disease. Further recommendations and or evaluation will be dependent upon findings.    Medication Adjustments/Labs and Tests Ordered: Current medicines are reviewed at length with the patient today.  Concerns regarding medicines are outlined above.  Medication changes, Labs and Tests ordered today are listed in the Patient Instructions below. Patient Instructions  Medication Instructions:  None  Labwork: None  Testing/Procedures: Your physician has requested that you have an exercise tolerance test. For further information please visit [email or web address]. Please also follow instruction sheet, as given.  Your physician has requested that you have an echocardiogram. Echocardiography is a painless test that uses sound waves to create images of your heart. It provides your doctor with information about the size and shape of your heart and how well your heart's chambers and valves are working. This procedure takes approximately one hour. There are no restrictions for this procedure.   Follow-Up: Your physician recommends that you schedule a follow-up appointment in: 4-6 weeks with Dr. Tamala Julian.   Any Other Special Instructions Will Be Listed Below (If Applicable).     If you need a refill on your cardiac medications before your next appointment, please call your pharmacy.      Signed, Sinclair Grooms, MD  06/18/2016 11:24 AM    [facility], [place], [place]   [address] Phone: [phone]; Fax: [phone]

## 2016-06-24 DIAGNOSIS — M5136 Other intervertebral disc degeneration, lumbar region: Secondary | ICD-10-CM | POA: Diagnosis not present

## 2016-06-26 DIAGNOSIS — Z23 Encounter for immunization: Secondary | ICD-10-CM | POA: Diagnosis not present

## 2016-06-26 DIAGNOSIS — E7800 Pure hypercholesterolemia, unspecified: Secondary | ICD-10-CM | POA: Diagnosis not present

## 2016-06-26 DIAGNOSIS — M810 Age-related osteoporosis without current pathological fracture: Secondary | ICD-10-CM | POA: Diagnosis not present

## 2016-06-26 DIAGNOSIS — E559 Vitamin D deficiency, unspecified: Secondary | ICD-10-CM | POA: Diagnosis not present

## 2016-06-26 DIAGNOSIS — I10 Essential (primary) hypertension: Secondary | ICD-10-CM | POA: Diagnosis not present

## 2016-06-26 DIAGNOSIS — Z0000 Encounter for general adult medical examination without abnormal findings: Secondary | ICD-10-CM | POA: Diagnosis not present

## 2016-06-30 DIAGNOSIS — R011 Cardiac murmur, unspecified: Secondary | ICD-10-CM | POA: Diagnosis not present

## 2016-06-30 DIAGNOSIS — G479 Sleep disorder, unspecified: Secondary | ICD-10-CM | POA: Diagnosis not present

## 2016-06-30 DIAGNOSIS — R0609 Other forms of dyspnea: Secondary | ICD-10-CM | POA: Diagnosis not present

## 2016-06-30 DIAGNOSIS — E7800 Pure hypercholesterolemia, unspecified: Secondary | ICD-10-CM | POA: Diagnosis not present

## 2016-06-30 DIAGNOSIS — E559 Vitamin D deficiency, unspecified: Secondary | ICD-10-CM | POA: Diagnosis not present

## 2016-06-30 DIAGNOSIS — M25559 Pain in unspecified hip: Secondary | ICD-10-CM | POA: Diagnosis not present

## 2016-06-30 DIAGNOSIS — I10 Essential (primary) hypertension: Secondary | ICD-10-CM | POA: Diagnosis not present

## 2016-07-01 ENCOUNTER — Ambulatory Visit (HOSPITAL_COMMUNITY): Payer: Medicare Other | Attending: Cardiovascular Disease

## 2016-07-01 ENCOUNTER — Other Ambulatory Visit: Payer: Self-pay

## 2016-07-01 ENCOUNTER — Other Ambulatory Visit: Payer: Self-pay | Admitting: *Deleted

## 2016-07-01 ENCOUNTER — Ambulatory Visit: Payer: Medicare Other

## 2016-07-01 DIAGNOSIS — I059 Rheumatic mitral valve disease, unspecified: Secondary | ICD-10-CM | POA: Diagnosis not present

## 2016-07-01 DIAGNOSIS — I119 Hypertensive heart disease without heart failure: Secondary | ICD-10-CM | POA: Diagnosis not present

## 2016-07-01 DIAGNOSIS — Z8249 Family history of ischemic heart disease and other diseases of the circulatory system: Secondary | ICD-10-CM | POA: Diagnosis not present

## 2016-07-01 DIAGNOSIS — I208 Other forms of angina pectoris: Secondary | ICD-10-CM

## 2016-07-01 DIAGNOSIS — R011 Cardiac murmur, unspecified: Secondary | ICD-10-CM | POA: Insufficient documentation

## 2016-07-01 DIAGNOSIS — R0609 Other forms of dyspnea: Principal | ICD-10-CM

## 2016-07-02 ENCOUNTER — Telehealth (HOSPITAL_COMMUNITY): Payer: Self-pay | Admitting: *Deleted

## 2016-07-02 NOTE — Telephone Encounter (Signed)
Attempted to call patient regarding upcoming appointment- no answer x 2. Carmon Sahli J Dontavion Noxon, RN 

## 2016-07-06 ENCOUNTER — Telehealth (HOSPITAL_COMMUNITY): Payer: Self-pay | Admitting: Radiology

## 2016-07-06 NOTE — Telephone Encounter (Signed)
Left message to call us back.   EHK

## 2016-07-07 ENCOUNTER — Ambulatory Visit (HOSPITAL_COMMUNITY): Payer: Medicare Other | Attending: Cardiology

## 2016-07-07 DIAGNOSIS — H3581 Retinal edema: Secondary | ICD-10-CM | POA: Diagnosis not present

## 2016-07-07 DIAGNOSIS — H35033 Hypertensive retinopathy, bilateral: Secondary | ICD-10-CM | POA: Diagnosis not present

## 2016-07-07 DIAGNOSIS — Z961 Presence of intraocular lens: Secondary | ICD-10-CM | POA: Diagnosis not present

## 2016-07-07 DIAGNOSIS — R0609 Other forms of dyspnea: Secondary | ICD-10-CM | POA: Insufficient documentation

## 2016-07-07 DIAGNOSIS — I208 Other forms of angina pectoris: Secondary | ICD-10-CM | POA: Diagnosis not present

## 2016-07-07 DIAGNOSIS — H348322 Tributary (branch) retinal vein occlusion, left eye, stable: Secondary | ICD-10-CM | POA: Diagnosis not present

## 2016-07-07 LAB — MYOCARDIAL PERFUSION IMAGING
CHL RATE OF PERCEIVED EXERTION: 15
Estimated workload: 4.6 METS
Exercise duration (min): 3 min
Exercise duration (sec): 1 s
LHR: 0.3
LVDIAVOL: 87 mL (ref 46–106)
LVSYSVOL: 27 mL
MPHR: 133 {beats}/min
NUC STRESS TID: 0.99
Peak HR: 139 {beats}/min
Percent HR: 104 %
Rest HR: 75 {beats}/min
SDS: 0
SRS: 2
SSS: 2

## 2016-07-07 MED ORDER — TECHNETIUM TC 99M TETROFOSMIN IV KIT
32.3000 | PACK | Freq: Once | INTRAVENOUS | Status: AC | PRN
  Administered 2016-07-07: 32.3 via INTRAVENOUS
  Filled 2016-07-07: qty 33

## 2016-07-07 MED ORDER — TECHNETIUM TC 99M TETROFOSMIN IV KIT
10.8000 | PACK | Freq: Once | INTRAVENOUS | Status: AC | PRN
  Administered 2016-07-07: 10.8 via INTRAVENOUS
  Filled 2016-07-07: qty 11

## 2016-07-10 ENCOUNTER — Telehealth: Payer: Self-pay | Admitting: Interventional Cardiology

## 2016-07-10 NOTE — Telephone Encounter (Signed)
Informed pt of stress test results. Pt verbalized understanding. 

## 2016-07-10 NOTE — Telephone Encounter (Signed)
New message    Pt verbalized that she is calling for results of MYO

## 2016-07-21 ENCOUNTER — Encounter: Payer: Self-pay | Admitting: Interventional Cardiology

## 2016-08-03 ENCOUNTER — Ambulatory Visit (INDEPENDENT_AMBULATORY_CARE_PROVIDER_SITE_OTHER): Payer: Medicare Other | Admitting: Interventional Cardiology

## 2016-08-03 ENCOUNTER — Encounter: Payer: Self-pay | Admitting: Interventional Cardiology

## 2016-08-03 VITALS — BP 108/64 | HR 70 | Ht 64.0 in | Wt 154.0 lb

## 2016-08-03 DIAGNOSIS — I208 Other forms of angina pectoris: Secondary | ICD-10-CM | POA: Diagnosis not present

## 2016-08-03 DIAGNOSIS — R0609 Other forms of dyspnea: Secondary | ICD-10-CM

## 2016-08-03 DIAGNOSIS — I421 Obstructive hypertrophic cardiomyopathy: Secondary | ICD-10-CM | POA: Diagnosis not present

## 2016-08-03 DIAGNOSIS — I10 Essential (primary) hypertension: Secondary | ICD-10-CM | POA: Diagnosis not present

## 2016-08-03 NOTE — Patient Instructions (Signed)
Medication Instructions:  1) When you finish your current supply of Amlodipine, discontinue this medication and start Cardizem CD 180mg  once daily.  Call our office when you have about 2-3 weeks of Amlodipine left and we will send in the prescription for your Cardizem.   Labwork: None  Testing/Procedures: None  Follow-Up: Your physician recommends that you schedule a follow-up appointment in: 4 months with Dr. Tamala Julian.    Any Other Special Instructions Will Be Listed Below (If Applicable).     If you need a refill on your cardiac medications before your next appointment, please call your pharmacy.

## 2016-08-03 NOTE — Progress Notes (Signed)
Cardiology Office Note    Date:  08/03/2016   ID:  Tiffany [name], DOB [birth year], MRN [id number]  PCP:  Melinda Crutch, MD  Cardiologist: Sinclair Grooms, MD   Chief Complaint  Patient presents with  . Shortness of Breath    History of Present Illness:  Tiffany [name] is a 82 y.o. female recently referred for evaluation of dyspnea. Echocardiogram demonstrated hypertrophic cardiomyopathy. EF 65%. Concentric hypertrophy measuring 18 mm.  Dyspnea with walking up inclines. No orthopnea or PND. Denies chest pain. Recent Cardiolite study did not reveal evidence of ischemia. She has never had syncope.  2 uncles on her mother's side died suddenly. Her father died of congestive heart failure. She has 2 siblings in their 85s with no history of heart disease. Another sister died in her 83s of noncardiac issues.  She has a long-standing history of hypertension. She feels that her pressures have been under good control. She has been told over time that she has a heart murmur. No history of rheumatic heart disease.    Past Medical History:  Diagnosis Date  . Aortic stenosis, mild    ECHO 09  . Colon polyps    benign  . Facial basal cell cancer    nose-/dr, Allyson Sabal  . Hearing loss in left ear   . Hypertension   . Retinal vein occlusion    Dr. Starling Manns  . Varicose veins   . Vitamin D deficiency     Past Surgical History:  Procedure Laterality Date  . EXTERNAL EAR SURGERY Right 1974  . EYE SURGERY Bilateral    cataract  . VAGINAL HYSTERECTOMY     without oopherectomy for birth control age 82    Current Medications: Outpatient Medications Prior to Visit  Medication Sig Dispense Refill  . amLODipine (NORVASC) 5 MG tablet Take 5 mg by mouth daily.    Marland Kitchen aspirin EC 81 MG tablet Take 81 mg by mouth daily.    . Cholecalciferol (VITAMIN D) 2000 units CAPS Take 1 capsule by mouth daily.    Marland Kitchen lisinopril-hydrochlorothiazide (PRINZIDE,ZESTORETIC) 20-25 MG tablet Take 1 tablet by mouth  daily.    . Misc Natural Products (OSTEO BI-FLEX ADV DOUBLE ST PO) Take 1 tablet by mouth daily.    . Multiple Vitamins-Minerals (PRESERVISION AREDS) CAPS Take 1 capsule by mouth daily.    . raloxifene (EVISTA) 60 MG tablet Take 60 mg by mouth daily.     No facility-administered medications prior to visit.      Allergies:   Hydrocodone-homatropine   Social History   Social History  . Marital status: Divorced    Spouse name: N/A  . Number of children: 5  . Years of education: college   Occupational History  . retired    Social History Main Topics  . Smoking status: Never Smoker  . Smokeless tobacco: Never Used  . Alcohol use Yes  . Drug use: No  . Sexual activity: Not Asked   Other Topics Concern  . None   Social History Narrative  . None     Family History:  The patient's family history includes Breast cancer in her sister; CAD in her father; Cerebral aneurysm in her mother; Colon cancer in her brother; Hypertension in her brother, mother, son, and son; Melanoma in her mother.   ROS:   Please see the history of present illness.    Reluctant to make changes in her medications. Chronic back pain. Shortness of breath on exertion.  All  other systems reviewed and are negative.   PHYSICAL EXAM:   VS:  BP 108/64   Pulse 70   Ht 5\' 4"  (1.626 m)   Wt 154 lb (69.9 kg)   SpO2 97%   BMI 26.43 kg/m    GEN: Well nourished, well developed, in no acute distress  HEENT: normal  Neck: no JVD, carotid bruits, or masses Cardiac: RRR, rubs, or gallops,no edema . There is a 2/6 systolic murmur at the left midsternal border. Respiratory:  clear to auscultation bilaterally, normal work of breathing GI: soft, nontender, nondistended, + BS MS: no deformity or atrophy  Skin: warm and dry, no rash Neuro:  Alert and Oriented x 3, Strength and sensation are intact Psych: euthymic mood, full affect  Wt Readings from Last 3 Encounters:  08/03/16 154 lb (69.9 kg)  07/07/16 153 lb (69.4  kg)  06/18/16 153 lb 1.9 oz (69.5 kg)      Studies/Labs Reviewed:   EKG:  EKG  Not repeated  Recent Labs: No results found for requested labs within last 8760 hours.   Lipid Panel No results found for: CHOL, TRIG, HDL, CHOLHDL, VLDL, LDLCALC, LDLDIRECT  Additional studies/ records that were reviewed today include:  Echocardiogram reveals severe left ventricular hypertrophy 06/2016:  ------------------------------------------------------------------- Study Conclusions  - Left ventricle: Wall thickness was increased in a pattern of   severe LVH. Systolic function was normal. The estimated ejection   fraction was in the range of 60% to 65%. Doppler parameters are   consistent with abnormal left ventricular relaxation (grade 1   diastolic dysfunction). Doppler parameters are consistent with   both elevated ventricular end-diastolic filling pressure and   elevated left atrial filling pressure. - Mitral valve: Calcified annulus. - Atrial septum: No defect or patent foramen ovale was identified.  Nuclear stress test 07/07/16 Study Highlights    Nuclear stress EF: 69%.  Blood pressure demonstrated a normal response to exercise.  There was no ST segment deviation noted during stress.  Nuclear images show generalized decreased counts on stress images but no obvious reversible defect.  This is a low risk study.  The left ventricular ejection fraction is hyperdynamic (>65%).     ASSESSMENT:    1. Hypertrophic obstructive cardiomyopathy ([place])   2. Essential hypertension   3. DOE (dyspnea on exertion)      PLAN:  In order of problems listed above:  1. With history of well-controlled hypertension over the years, suspect that this is a non-hypertension related hypertrophic cardiomyopathy. We discussed switching to a negative inotropic agent which may help improve her breathing. We will switch Norvasc to diltiazem CD 180 mg per day. Follow-up 2-4 weeks after the switch.  Once we make the switch, I will follow her once per year. 2. Blood pressure is under excellent control. With the change in medication, we will evaluate blood pressure on return. 3. Related to hypertrophic cardiomyopathy.    Medication Adjustments/Labs and Tests Ordered: Current medicines are reviewed at length with the patient today.  Concerns regarding medicines are outlined above.  Medication changes, Labs and Tests ordered today are listed in the Patient Instructions below. Patient Instructions  Medication Instructions:  1) When you finish your current supply of Amlodipine, discontinue this medication and start Cardizem CD 180mg  once daily.  Call our office when you have about 2-3 weeks of Amlodipine left and we will send in the prescription for your Cardizem.   Labwork: None  Testing/Procedures: None  Follow-Up: Your physician recommends that you  schedule a follow-up appointment in: 4 months with Dr. Tamala Julian.    Any Other Special Instructions Will Be Listed Below (If Applicable).     If you need a refill on your cardiac medications before your next appointment, please call your pharmacy.      Signed, Sinclair Grooms, MD  08/03/2016 10:39 AM    [facility], [place], [place]  [address] Phone: [phone]; Fax: [phone]

## 2016-08-11 DIAGNOSIS — H348322 Tributary (branch) retinal vein occlusion, left eye, stable: Secondary | ICD-10-CM | POA: Diagnosis not present

## 2016-08-11 DIAGNOSIS — H3581 Retinal edema: Secondary | ICD-10-CM | POA: Diagnosis not present

## 2016-09-08 DIAGNOSIS — H3581 Retinal edema: Secondary | ICD-10-CM | POA: Diagnosis not present

## 2016-09-08 DIAGNOSIS — H348322 Tributary (branch) retinal vein occlusion, left eye, stable: Secondary | ICD-10-CM | POA: Diagnosis not present

## 2016-09-30 ENCOUNTER — Telehealth: Payer: Self-pay | Admitting: Interventional Cardiology

## 2016-09-30 MED ORDER — DILTIAZEM HCL ER COATED BEADS 180 MG PO CP24: 180.0000 mg | ORAL_CAPSULE | Freq: Every day | ORAL | 3 refills | Status: DC

## 2016-09-30 NOTE — Telephone Encounter (Signed)
New Message  Pt states she is returning RN call. please call back to discuss.

## 2016-09-30 NOTE — Telephone Encounter (Signed)
Pt had requested at last OV that I call when it is about time to switch her Amlodipine to the Cardizem as recommended by Dr. Tamala Julian.  Pt states she is almost our of Amlodipine so ok to go ahead and send in prescription for Cardizem.  Advised pt to call if any issues once she starts Cardizem.  Pt appreciative for assistance.

## 2016-10-20 DIAGNOSIS — H34832 Tributary (branch) retinal vein occlusion, left eye, with macular edema: Secondary | ICD-10-CM | POA: Diagnosis not present

## 2016-11-13 DIAGNOSIS — I10 Essential (primary) hypertension: Secondary | ICD-10-CM | POA: Diagnosis not present

## 2016-11-13 DIAGNOSIS — G479 Sleep disorder, unspecified: Secondary | ICD-10-CM | POA: Diagnosis not present

## 2016-12-01 DIAGNOSIS — H6121 Impacted cerumen, right ear: Secondary | ICD-10-CM | POA: Diagnosis not present

## 2016-12-01 DIAGNOSIS — H6992 Unspecified Eustachian tube disorder, left ear: Secondary | ICD-10-CM | POA: Diagnosis not present

## 2016-12-02 ENCOUNTER — Ambulatory Visit: Payer: Medicare Other | Admitting: Interventional Cardiology

## 2016-12-03 DIAGNOSIS — H9201 Otalgia, right ear: Secondary | ICD-10-CM | POA: Diagnosis not present

## 2016-12-08 DIAGNOSIS — Z961 Presence of intraocular lens: Secondary | ICD-10-CM | POA: Diagnosis not present

## 2016-12-08 DIAGNOSIS — H348322 Tributary (branch) retinal vein occlusion, left eye, stable: Secondary | ICD-10-CM | POA: Diagnosis not present

## 2016-12-08 DIAGNOSIS — H3581 Retinal edema: Secondary | ICD-10-CM | POA: Diagnosis not present

## 2016-12-08 DIAGNOSIS — H35033 Hypertensive retinopathy, bilateral: Secondary | ICD-10-CM | POA: Diagnosis not present

## 2016-12-09 DIAGNOSIS — H9201 Otalgia, right ear: Secondary | ICD-10-CM | POA: Diagnosis not present

## 2016-12-09 DIAGNOSIS — R51 Headache: Secondary | ICD-10-CM | POA: Diagnosis not present

## 2016-12-31 DIAGNOSIS — H9209 Otalgia, unspecified ear: Secondary | ICD-10-CM | POA: Diagnosis not present

## 2017-01-18 DIAGNOSIS — L821 Other seborrheic keratosis: Secondary | ICD-10-CM | POA: Diagnosis not present

## 2017-01-18 DIAGNOSIS — L728 Other follicular cysts of the skin and subcutaneous tissue: Secondary | ICD-10-CM | POA: Diagnosis not present

## 2017-01-18 DIAGNOSIS — D225 Melanocytic nevi of trunk: Secondary | ICD-10-CM | POA: Diagnosis not present

## 2017-01-18 DIAGNOSIS — L814 Other melanin hyperpigmentation: Secondary | ICD-10-CM | POA: Diagnosis not present

## 2017-01-18 DIAGNOSIS — D1800 Hemangioma unspecified site: Secondary | ICD-10-CM | POA: Diagnosis not present

## 2017-01-19 NOTE — Progress Notes (Addendum)
Cardiology Office Note    Date:  01/22/2017   ID:  Tiffany [name], DOB 1926/07/30, MRN [id number]  PCP:  Melinda Crutch, MD  Cardiologist: Sinclair Grooms, MD   Chief Complaint  Patient presents with  . Congestive Heart Failure    History of Present Illness:  Tiffany [name] is a 83 y.o. female hypertrophic cardiomyopathy. EF 65%. Concentric hypertrophy measuring 18 mm.  The patient has dyspnea she walks up a steep incline. Otherwise she has no complaints. She denies chest discomfort, syncope, and orthopnea. There is no peripheral edema.   Past Medical History:  Diagnosis Date  . Aortic stenosis, mild    ECHO 09  . Colon polyps    benign  . Facial basal cell cancer    nose-/dr, Tiffany [name]  . Hearing loss in left ear   . Hypertension   . Retinal vein occlusion    Dr. Starling Manns  . Varicose veins   . Vitamin D deficiency     Past Surgical History:  Procedure Laterality Date  . EXTERNAL EAR SURGERY Right 1974  . EYE SURGERY Bilateral    cataract  . VAGINAL HYSTERECTOMY     without oopherectomy for birth control age 67    Current Medications: Outpatient Medications Prior to Visit  Medication Sig Dispense Refill  . aspirin EC 81 MG tablet Take 81 mg by mouth daily.    . Cholecalciferol (VITAMIN D) 2000 units CAPS Take 1 capsule by mouth daily.    Marland Kitchen lisinopril-hydrochlorothiazide (PRINZIDE,ZESTORETIC) 20-25 MG tablet Take 1 tablet by mouth daily.    . Misc Natural Products (OSTEO BI-FLEX ADV DOUBLE ST PO) Take 1 tablet by mouth daily.    . Multiple Vitamins-Minerals (PRESERVISION AREDS) CAPS Take 1 capsule by mouth daily.    . raloxifene (EVISTA) 60 MG tablet Take 60 mg by mouth daily.    Marland Kitchen diltiazem (CARDIZEM CD) 180 MG 24 hr capsule Take 1 capsule (180 mg total) by mouth daily. 90 capsule 3   No facility-administered medications prior to visit.      Allergies:   Hydrocodone-homatropine   Social History   Social History  . Marital status: Divorced    Spouse name:  N/A  . Number of children: 5  . Years of education: college   Occupational History  . retired    Social History Main Topics  . Smoking status: Never Smoker  . Smokeless tobacco: Never Used  . Alcohol use Yes  . Drug use: No  . Sexual activity: Not Asked   Other Topics Concern  . None   Social History Narrative  . None     Family History:  The patient's family history includes Breast cancer in her sister; CAD in her father; Cerebral aneurysm in her mother; Colon cancer in her brother; Hypertension in her brother, mother, son, and son; Melanoma in her mother.   ROS:   Please see the history of present illness.    Occasional depression and back discomfort otherwise unremarkable. She does housework and is totally independent.  All other systems reviewed and are negative.   PHYSICAL EXAM:   VS:  BP (!) 122/58 (BP Location: Left Arm)   Pulse 78   Ht 5\' 3"  (1.6 m)   Wt 153 lb (69.4 kg)   BMI 27.10 kg/m    GEN: Well nourished, well developed, in no acute distress  HEENT: normal  Neck: no JVD, carotid bruits, or masses Cardiac: RRR; 2/6 systolic murmur along the left lower  sternal border increases to 3-4/6 with standing compatible with LV outflow obstruction due to dynamic physiology; no rubs or edema . An S4 gallop is audible. Respiratory:  clear to auscultation bilaterally, normal work of breathing GI: soft, nontender, nondistended, + BS MS: no deformity or atrophy  Skin: warm and dry, no rash Neuro:  Alert and Oriented x 3, Strength and sensation are intact Psych: euthymic mood, full affect  Wt Readings from Last 3 Encounters:  01/20/17 153 lb (69.4 kg)  08/03/16 154 lb (69.9 kg)  07/07/16 153 lb (69.4 kg)      Studies/Labs Reviewed:   EKG:  EKG  EKG is not repeated.  Recent Labs: 01/20/2017: BUN 18; Creatinine, Ser 0.78; Potassium 4.2; Sodium 137   Lipid Panel No results found for: CHOL, TRIG, HDL, CHOLHDL, VLDL, LDLCALC, LDLDIRECT  Additional studies/ records  that were reviewed today include:  Echocardiogram 07/01/16: Study Conclusions  - Left ventricle: Wall thickness was increased in a pattern of   severe LVH. Systolic function was normal. The estimated ejection   fraction was in the range of 60% to 65%. Doppler parameters are   consistent with abnormal left ventricular relaxation (grade 1   diastolic dysfunction). Doppler parameters are consistent with   both elevated ventricular end-diastolic filling pressure and   elevated left atrial filling pressure. - Mitral valve: Calcified annulus. - Atrial septum: No defect or patent foramen ovale was identified.   ASSESSMENT:    1. Hypertrophic obstructive cardiomyopathy ([place])   2. Essential hypertension   3. DOE (dyspnea on exertion)      PLAN:  In order of problems listed above:  1. Clinically stable. No change in current therapy. No side effects on the current medical regimen. Cautioned to call if chest discomfort, syncope/near syncope, or progressive dyspnea. 2. Because of her medical regimen and interaction that hypokalemia may have with hypertrophic cardiomyopathy in terms of arrhythmia, we will perform a basic metabolic panel today. This should be done at least twice a year to help [place] avoid hypokalemia  Clinical follow-up with me in one year. Earlier if symptoms suggesting progression of hypertrophic cardiomyopathy i.e. syncope, anginal quality chest discomfort, or dyspnea    Medication Adjustments/Labs and Tests Ordered: Current medicines are reviewed at length with the patient today.  Concerns regarding medicines are outlined above.  Medication changes, Labs and Tests ordered today are listed in the Patient Instructions below. Patient Instructions  Medication Instructions:  None  Labwork: BMET today  Testing/Procedures: None  Follow-Up: Your physician wants you to follow-up in: 1 year with Dr. Tamala Julian.  You will receive a reminder letter in the mail two months in advance.  If you don't receive a letter, please call our office to schedule the follow-up appointment.   Any Other Special Instructions Will Be Listed Below (If Applicable).     If you need a refill on your cardiac medications before your next appointment, please call your pharmacy.      Signed, Sinclair Grooms, MD  01/22/2017 11:48 AM    [facility] [place], [place], [place]  [address] Phone: [phone]; Fax: [phone]

## 2017-01-20 ENCOUNTER — Ambulatory Visit (INDEPENDENT_AMBULATORY_CARE_PROVIDER_SITE_OTHER): Payer: Medicare Other | Admitting: Interventional Cardiology

## 2017-01-20 ENCOUNTER — Encounter: Payer: Self-pay | Admitting: Interventional Cardiology

## 2017-01-20 VITALS — BP 122/58 | HR 78 | Ht 63.0 in | Wt 153.0 lb

## 2017-01-20 DIAGNOSIS — I421 Obstructive hypertrophic cardiomyopathy: Secondary | ICD-10-CM

## 2017-01-20 DIAGNOSIS — R0609 Other forms of dyspnea: Secondary | ICD-10-CM | POA: Diagnosis not present

## 2017-01-20 DIAGNOSIS — I10 Essential (primary) hypertension: Secondary | ICD-10-CM

## 2017-01-20 LAB — BASIC METABOLIC PANEL
BUN / CREAT RATIO: 23 (ref 12–28)
BUN: 18 mg/dL (ref 8–27)
CO2: 24 mmol/L (ref 18–29)
CREATININE: 0.78 mg/dL (ref 0.57–1.00)
Calcium: 10.2 mg/dL (ref 8.7–10.3)
Chloride: 97 mmol/L (ref 96–106)
GFR calc Af Amer: 78 mL/min/{1.73_m2} (ref >59)
GFR, EST NON AFRICAN AMERICAN: 68 mL/min/{1.73_m2} (ref >59)
Glucose: 111 mg/dL — ABNORMAL HIGH (ref 65–99)
Potassium: 4.2 mmol/L (ref 3.5–5.2)
SODIUM: 137 mmol/L (ref 134–144)

## 2017-01-20 NOTE — Patient Instructions (Signed)
Medication Instructions:  None  Labwork: BMET today  Testing/Procedures: None  Follow-Up: Your physician wants you to follow-up in: 1 year with Dr. Tamala Julian.  You will receive a reminder letter in the mail two months in advance. If you don't receive a letter, please call our office to schedule the follow-up appointment.   Any Other Special Instructions Will Be Listed Below (If Applicable).     If you need a refill on your cardiac medications before your next appointment, please call your pharmacy.

## 2017-01-21 ENCOUNTER — Encounter: Payer: Self-pay | Admitting: *Deleted

## 2017-01-22 ENCOUNTER — Telehealth: Payer: Self-pay | Admitting: Interventional Cardiology

## 2017-01-22 NOTE — Telephone Encounter (Signed)
New Message  Pt is returning your call 

## 2017-01-22 NOTE — Telephone Encounter (Signed)
Informed pt of lab results. Pt verbalized understanding. 

## 2017-03-16 DIAGNOSIS — H34832 Tributary (branch) retinal vein occlusion, left eye, with macular edema: Secondary | ICD-10-CM | POA: Diagnosis not present

## 2017-03-16 DIAGNOSIS — Z961 Presence of intraocular lens: Secondary | ICD-10-CM | POA: Diagnosis not present

## 2017-03-16 DIAGNOSIS — H35033 Hypertensive retinopathy, bilateral: Secondary | ICD-10-CM | POA: Diagnosis not present

## 2017-03-16 DIAGNOSIS — H3581 Retinal edema: Secondary | ICD-10-CM | POA: Diagnosis not present

## 2017-04-20 DIAGNOSIS — H34832 Tributary (branch) retinal vein occlusion, left eye, with macular edema: Secondary | ICD-10-CM | POA: Diagnosis not present

## 2017-04-20 DIAGNOSIS — H3581 Retinal edema: Secondary | ICD-10-CM | POA: Diagnosis not present

## 2017-05-25 DIAGNOSIS — H3581 Retinal edema: Secondary | ICD-10-CM | POA: Diagnosis not present

## 2017-05-25 DIAGNOSIS — H35362 Drusen (degenerative) of macula, left eye: Secondary | ICD-10-CM | POA: Diagnosis not present

## 2017-05-25 DIAGNOSIS — H34832 Tributary (branch) retinal vein occlusion, left eye, with macular edema: Secondary | ICD-10-CM | POA: Diagnosis not present

## 2017-06-29 DIAGNOSIS — H3581 Retinal edema: Secondary | ICD-10-CM | POA: Diagnosis not present

## 2017-06-29 DIAGNOSIS — H34832 Tributary (branch) retinal vein occlusion, left eye, with macular edema: Secondary | ICD-10-CM | POA: Diagnosis not present

## 2017-08-04 DIAGNOSIS — E7800 Pure hypercholesterolemia, unspecified: Secondary | ICD-10-CM | POA: Diagnosis not present

## 2017-08-04 DIAGNOSIS — Z23 Encounter for immunization: Secondary | ICD-10-CM | POA: Diagnosis not present

## 2017-08-04 DIAGNOSIS — Z0000 Encounter for general adult medical examination without abnormal findings: Secondary | ICD-10-CM | POA: Diagnosis not present

## 2017-08-04 DIAGNOSIS — M810 Age-related osteoporosis without current pathological fracture: Secondary | ICD-10-CM | POA: Diagnosis not present

## 2017-08-04 DIAGNOSIS — E559 Vitamin D deficiency, unspecified: Secondary | ICD-10-CM | POA: Diagnosis not present

## 2017-08-04 DIAGNOSIS — M179 Osteoarthritis of knee, unspecified: Secondary | ICD-10-CM | POA: Diagnosis not present

## 2017-08-04 DIAGNOSIS — L723 Sebaceous cyst: Secondary | ICD-10-CM | POA: Diagnosis not present

## 2017-08-04 DIAGNOSIS — I10 Essential (primary) hypertension: Secondary | ICD-10-CM | POA: Diagnosis not present

## 2017-08-06 DIAGNOSIS — W19XXXA Unspecified fall, initial encounter: Secondary | ICD-10-CM | POA: Diagnosis not present

## 2017-08-06 DIAGNOSIS — M25521 Pain in right elbow: Secondary | ICD-10-CM | POA: Diagnosis not present

## 2017-08-06 DIAGNOSIS — S0181XA Laceration without foreign body of other part of head, initial encounter: Secondary | ICD-10-CM | POA: Diagnosis not present

## 2017-08-10 DIAGNOSIS — S52134A Nondisplaced fracture of neck of right radius, initial encounter for closed fracture: Secondary | ICD-10-CM | POA: Diagnosis not present

## 2017-08-17 DIAGNOSIS — H3581 Retinal edema: Secondary | ICD-10-CM | POA: Diagnosis not present

## 2017-08-17 DIAGNOSIS — H35361 Drusen (degenerative) of macula, right eye: Secondary | ICD-10-CM | POA: Diagnosis not present

## 2017-08-17 DIAGNOSIS — H35372 Puckering of macula, left eye: Secondary | ICD-10-CM | POA: Diagnosis not present

## 2017-08-17 DIAGNOSIS — H34832 Tributary (branch) retinal vein occlusion, left eye, with macular edema: Secondary | ICD-10-CM | POA: Diagnosis not present

## 2017-09-09 DIAGNOSIS — S52134D Nondisplaced fracture of neck of right radius, subsequent encounter for closed fracture with routine healing: Secondary | ICD-10-CM | POA: Diagnosis not present

## 2017-09-26 ENCOUNTER — Other Ambulatory Visit: Payer: Self-pay | Admitting: Interventional Cardiology

## 2017-10-08 DIAGNOSIS — S52134D Nondisplaced fracture of neck of right radius, subsequent encounter for closed fracture with routine healing: Secondary | ICD-10-CM | POA: Diagnosis not present

## 2017-10-11 DIAGNOSIS — M25621 Stiffness of right elbow, not elsewhere classified: Secondary | ICD-10-CM | POA: Diagnosis not present

## 2017-10-13 DIAGNOSIS — M25621 Stiffness of right elbow, not elsewhere classified: Secondary | ICD-10-CM | POA: Diagnosis not present

## 2017-10-18 DIAGNOSIS — M25621 Stiffness of right elbow, not elsewhere classified: Secondary | ICD-10-CM | POA: Diagnosis not present

## 2017-10-19 DIAGNOSIS — H34832 Tributary (branch) retinal vein occlusion, left eye, with macular edema: Secondary | ICD-10-CM | POA: Diagnosis not present

## 2017-10-19 DIAGNOSIS — H3581 Retinal edema: Secondary | ICD-10-CM | POA: Diagnosis not present

## 2017-10-20 DIAGNOSIS — M25621 Stiffness of right elbow, not elsewhere classified: Secondary | ICD-10-CM | POA: Diagnosis not present

## 2017-10-25 DIAGNOSIS — M25621 Stiffness of right elbow, not elsewhere classified: Secondary | ICD-10-CM | POA: Diagnosis not present

## 2017-10-27 DIAGNOSIS — M25621 Stiffness of right elbow, not elsewhere classified: Secondary | ICD-10-CM | POA: Diagnosis not present

## 2017-11-01 DIAGNOSIS — M25621 Stiffness of right elbow, not elsewhere classified: Secondary | ICD-10-CM | POA: Diagnosis not present

## 2017-11-03 DIAGNOSIS — M25621 Stiffness of right elbow, not elsewhere classified: Secondary | ICD-10-CM | POA: Diagnosis not present

## 2017-11-04 DIAGNOSIS — S52134D Nondisplaced fracture of neck of right radius, subsequent encounter for closed fracture with routine healing: Secondary | ICD-10-CM | POA: Diagnosis not present

## 2017-12-19 ENCOUNTER — Other Ambulatory Visit: Payer: Self-pay | Admitting: Interventional Cardiology

## 2017-12-21 DIAGNOSIS — H3581 Retinal edema: Secondary | ICD-10-CM | POA: Diagnosis not present

## 2017-12-21 DIAGNOSIS — H34832 Tributary (branch) retinal vein occlusion, left eye, with macular edema: Secondary | ICD-10-CM | POA: Diagnosis not present

## 2017-12-24 DIAGNOSIS — D485 Neoplasm of uncertain behavior of skin: Secondary | ICD-10-CM | POA: Diagnosis not present

## 2018-01-18 DIAGNOSIS — L905 Scar conditions and fibrosis of skin: Secondary | ICD-10-CM | POA: Diagnosis not present

## 2018-01-18 DIAGNOSIS — L814 Other melanin hyperpigmentation: Secondary | ICD-10-CM | POA: Diagnosis not present

## 2018-01-18 DIAGNOSIS — L820 Inflamed seborrheic keratosis: Secondary | ICD-10-CM | POA: Diagnosis not present

## 2018-01-18 DIAGNOSIS — D225 Melanocytic nevi of trunk: Secondary | ICD-10-CM | POA: Diagnosis not present

## 2018-01-18 DIAGNOSIS — Z85828 Personal history of other malignant neoplasm of skin: Secondary | ICD-10-CM | POA: Diagnosis not present

## 2018-01-18 DIAGNOSIS — L570 Actinic keratosis: Secondary | ICD-10-CM | POA: Diagnosis not present

## 2018-01-18 DIAGNOSIS — L821 Other seborrheic keratosis: Secondary | ICD-10-CM | POA: Diagnosis not present

## 2018-01-18 DIAGNOSIS — D1801 Hemangioma of skin and subcutaneous tissue: Secondary | ICD-10-CM | POA: Diagnosis not present

## 2018-03-11 DIAGNOSIS — H9209 Otalgia, unspecified ear: Secondary | ICD-10-CM | POA: Diagnosis not present

## 2018-03-11 DIAGNOSIS — L723 Sebaceous cyst: Secondary | ICD-10-CM | POA: Diagnosis not present

## 2018-03-15 DIAGNOSIS — H35033 Hypertensive retinopathy, bilateral: Secondary | ICD-10-CM | POA: Diagnosis not present

## 2018-03-15 DIAGNOSIS — H3581 Retinal edema: Secondary | ICD-10-CM | POA: Diagnosis not present

## 2018-03-15 DIAGNOSIS — H34832 Tributary (branch) retinal vein occlusion, left eye, with macular edema: Secondary | ICD-10-CM | POA: Diagnosis not present

## 2018-03-23 DIAGNOSIS — L723 Sebaceous cyst: Secondary | ICD-10-CM | POA: Diagnosis not present

## 2018-03-23 DIAGNOSIS — H6121 Impacted cerumen, right ear: Secondary | ICD-10-CM | POA: Diagnosis not present

## 2018-03-31 ENCOUNTER — Ambulatory Visit: Payer: Self-pay | Admitting: Surgery

## 2018-03-31 DIAGNOSIS — L720 Epidermal cyst: Secondary | ICD-10-CM | POA: Diagnosis not present

## 2018-03-31 DIAGNOSIS — L0291 Cutaneous abscess, unspecified: Secondary | ICD-10-CM | POA: Diagnosis not present

## 2018-04-04 ENCOUNTER — Other Ambulatory Visit: Payer: Self-pay

## 2018-04-04 ENCOUNTER — Encounter (HOSPITAL_BASED_OUTPATIENT_CLINIC_OR_DEPARTMENT_OTHER): Payer: Self-pay

## 2018-04-04 NOTE — Progress Notes (Signed)
Spoke with: Deanette NPO:  After Midnight, no gum, candy, or mints   Arrival time: 0900AM Labs: Istat8, EKG AM medications: Dlitiazem, Raloxifene Pre op orders: Yes Ride home: Tonimarie Gritz (daughter) [phone]

## 2018-04-06 ENCOUNTER — Encounter (HOSPITAL_BASED_OUTPATIENT_CLINIC_OR_DEPARTMENT_OTHER): Payer: Self-pay | Admitting: Surgery

## 2018-04-06 DIAGNOSIS — L720 Epidermal cyst: Secondary | ICD-10-CM | POA: Diagnosis present

## 2018-04-06 NOTE — H&P (Signed)
General Surgery [facility]  Tiffany [name] DOB: [birth year] Divorced / Language: English / Race: White Female   History of Present Illness  The patient is a 84 year old female who presents with a subcutaneous abscess.  CHIEF COMPLAINT: posterior neck cyst / abscess  Patient is referred by Dr. Melinda Crutch, her primary care physician, for evaluation of an infected sebaceous cyst on the posterior scalp and neck. Patient states that this is been present for several years. It is gradually increased in size. Recently became inflamed and cause discomfort. Patient was placed on an oral antibiotic by her ENT. She has been taking doxycycline. Patient is now experience spontaneous rupture and partial drainage of the mass. Patient notes other smaller type lesions on the scalp. She has not had any of these previously excised. She now presents for surgical management.   Past Surgical History Cataract Surgery  Bilateral. Colon Polyp Removal - Colonoscopy  Colon Polyp Removal - Open  Hysterectomy (not due to cancer) - Complete  Tonsillectomy   Diagnostic Studies History Pap Smear  >5 years ago  Allergies No Known Drug Allergies [03/31/2018]: Allergies Reconciled   Medication History DilTIAZem HCl ER Coated Beads (180MG  Capsule ER 24HR, Oral) Active. Lisinopril-Hydrochlorothiazide (20-25MG  Tablet, Oral) Active. Raloxifene HCl (60MG  Tablet, Oral) Active. Vitamin D3 (5000UNIT Tablet, Oral) Active. Osteo Bi-Flex Adv Joint Shield (Oral) Active. Baby Aspirin (81MG  Tablet Chewable, Oral) Active. Medications Reconciled  Social History Alcohol use  Occasional alcohol use. Caffeine use  Coffee, Tea. No drug use  Tobacco use  Never smoker.  Family History Alcohol Abuse  Daughter. Arthritis  Father. Breast Cancer  Sister. Colon Cancer  Brother. Heart Disease  Father. Hypertension  Brother, Mother, Son.  Pregnancy / Birth History Age at  menarche  67 years. Gravida  7 Maternal age  75-25 Para  34  Other Problems Oophorectomy   Review of Systems General Present- Fatigue. Not Present- Appetite Loss, Chills, Fever, Night Sweats, Weight Gain and Weight Loss. HEENT Not Present- Earache, Hearing Loss, Hoarseness, Nose Bleed, Oral Ulcers, Ringing in the Ears, Seasonal Allergies, Sinus Pain, Sore Throat, Visual Disturbances, Wears glasses/contact lenses and Yellow Eyes. Respiratory Not Present- Bloody sputum, Chronic Cough, Difficulty Breathing, Snoring and Wheezing. Breast Not Present- Breast Mass, Breast Pain, Nipple Discharge and Skin Changes. Cardiovascular Present- Shortness of Breath. Not Present- Chest Pain, Difficulty Breathing Lying Down, Leg Cramps, Palpitations, Rapid Heart Rate and Swelling of Extremities. Gastrointestinal Present- Hemorrhoids. Not Present- Abdominal Pain, Bloating, Bloody Stool, Change in Bowel Habits, Chronic diarrhea, Constipation, Difficulty Swallowing, Excessive gas, Gets full quickly at meals, Indigestion, Nausea, Rectal Pain and Vomiting. Female Genitourinary Not Present- Frequency, Nocturia, Painful Urination, Pelvic Pain and Urgency. Musculoskeletal Present- Joint Pain. Not Present- Back Pain, Joint Stiffness, Muscle Pain, Muscle Weakness and Swelling of Extremities. Neurological Not Present- Decreased Memory, Fainting, Headaches, Numbness, Seizures, Tingling, Tremor, Trouble walking and Weakness. Psychiatric Not Present- Anxiety, Bipolar, Change in Sleep Pattern, Depression, Fearful and Frequent crying. Endocrine Not Present- Cold Intolerance, Excessive Hunger, Hair Changes, Heat Intolerance, Hot flashes and New Diabetes. Hematology Not Present- Blood Thinners, Easy Bruising, Excessive bleeding, Gland problems, HIV and Persistent Infections.  Vitals Weight: 155.2 lb Height: 63in Body Surface Area: 1.74 m Body Mass Index: 27.49 kg/m  Temp.: 98.20F  Pulse: 88 (Regular)  BP:  132/74 (Sitting, Left Arm, Standard)   Physical Exam  See vital signs recorded above  GENERAL APPEARANCE Development: normal Nutritional status: normal Gross deformities: none  SKIN Rash, lesions, ulcers: none  Induration, erythema: none Nodules: none palpable  EYES Conjunctiva and lids: normal Pupils: equal and reactive Iris: normal bilaterally  EARS, NOSE, MOUTH, THROAT External ears: no lesion or deformity External nose: no lesion or deformity Hearing: grossly normal Lips: no lesion or deformity Dentition: normal for age Oral mucosa: moist There are multiple epidermal inclusion cyst involving the scalp  NECK Symmetric: yes Trachea: midline Thyroid: no palpable nodules in the thyroid bed On the posterior neck just to the left of midline above the hairline is a 3.0 x 3.5 x 3.0 cm cystic mass. This is mildly tender. It is disrupted at its superior most aspect with a dry eschar. There is a small amount of drainage.  CHEST Respiratory effort: normal Retraction or accessory muscle use: no Breath sounds: normal bilaterally Rales, rhonchi, wheeze: none  CARDIOVASCULAR Auscultation: Irregular rhythm with a controlled rate Murmurs: Moderate systolic murmur upper left sternal border Pulses: carotid and radial pulse 2+ palpable Lower extremity edema: none Lower extremity varicosities: none  MUSCULOSKELETAL Station and gait: normal Digits and nails: no clubbing or cyanosis Muscle strength: grossly normal all extremities Range of motion: grossly normal all extremities Deformity: none  LYMPHATIC Cervical: none palpable Supraclavicular: none palpable  PSYCHIATRIC Oriented to person, place, and time: yes Mood and affect: normal for situation Judgment and insight: appropriate for situation    Assessment & Plan   EPIDERMAL CYST OF NECK (L72.0) ABSCESS (L02.91)  Patient presents with an enlarging cystic mass at the posterior scalp which likely represents an  infected epidermal inclusion cyst or sebaceous cyst. It has spontaneously drained. She has been on antibiotics.  This will require surgical excision. Unfortunately it is relatively large across the base patient at least 3-3.5 cm. That will be difficult to close primarily. He will likely need to be completely excised and then the wound partially closed and then treated with dressing changes for 2-3 weeks until there is complete healing. Due to the fact that this is infected, I would not attempt to remove any of the other epidermal inclusion cyst off of the scalp at the same time.  The patient and I discussed this procedure. This would be an outpatient surgery. We would then managed wound here in the office. Patient agrees to proceed. We will make arrangements for surgery in the near future.  The risks and benefits of the procedure have been discussed at length with the patient. The patient understands the proposed procedure, potential alternative treatments, and the course of recovery to be expected. All of the patient's questions have been answered at this time. The patient wishes to proceed with surgery.  Armandina Gemma, [facility] Office: [phone]

## 2018-04-07 ENCOUNTER — Ambulatory Visit (HOSPITAL_BASED_OUTPATIENT_CLINIC_OR_DEPARTMENT_OTHER)
Admission: RE | Admit: 2018-04-07 | Discharge: 2018-04-07 | Disposition: A | Discharge status: Home / Self Care | Payer: Medicare Other | Source: Ambulatory Visit | Attending: Surgery | Admitting: Surgery

## 2018-04-07 ENCOUNTER — Encounter (HOSPITAL_BASED_OUTPATIENT_CLINIC_OR_DEPARTMENT_OTHER): Payer: Self-pay | Admitting: Surgery

## 2018-04-07 ENCOUNTER — Encounter (HOSPITAL_BASED_OUTPATIENT_CLINIC_OR_DEPARTMENT_OTHER)
Admission: RE | Disposition: A | Discharge status: Home / Self Care | Payer: Self-pay | Source: Ambulatory Visit | Attending: Surgery

## 2018-04-07 ENCOUNTER — Ambulatory Visit (HOSPITAL_BASED_OUTPATIENT_CLINIC_OR_DEPARTMENT_OTHER): Payer: Medicare Other | Admitting: Anesthesiology

## 2018-04-07 DIAGNOSIS — L720 Epidermal cyst: Secondary | ICD-10-CM | POA: Diagnosis not present

## 2018-04-07 DIAGNOSIS — I10 Essential (primary) hypertension: Secondary | ICD-10-CM | POA: Diagnosis not present

## 2018-04-07 DIAGNOSIS — L0211 Cutaneous abscess of neck: Secondary | ICD-10-CM | POA: Insufficient documentation

## 2018-04-07 DIAGNOSIS — L723 Sebaceous cyst: Secondary | ICD-10-CM | POA: Diagnosis present

## 2018-04-07 DIAGNOSIS — Z79899 Other long term (current) drug therapy: Secondary | ICD-10-CM | POA: Insufficient documentation

## 2018-04-07 DIAGNOSIS — I421 Obstructive hypertrophic cardiomyopathy: Secondary | ICD-10-CM | POA: Diagnosis not present

## 2018-04-07 HISTORY — DX: Other forms of dyspnea: R06.09

## 2018-04-07 HISTORY — DX: Hyperlipidemia, unspecified: E78.5

## 2018-04-07 HISTORY — PX: CYST EXCISION: SHX5701

## 2018-04-07 HISTORY — DX: Obstructive hypertrophic cardiomyopathy: I42.1

## 2018-04-07 HISTORY — DX: Cardiac murmur, unspecified: R01.1

## 2018-04-07 HISTORY — DX: Unspecified osteoarthritis, unspecified site: M19.90

## 2018-04-07 HISTORY — DX: Personal history of colonic polyps: Z86.010

## 2018-04-07 HISTORY — DX: Dyspnea, unspecified: R06.00

## 2018-04-07 HISTORY — DX: Age-related osteoporosis without current pathological fracture: M81.0

## 2018-04-07 HISTORY — DX: Unspecified cataract: H26.9

## 2018-04-07 LAB — POCT I-STAT, CHEM 8
BUN: 20 mg/dL (ref 8–23)
CREATININE: 0.8 mg/dL (ref 0.44–1.00)
Calcium, Ion: 1.4 mmol/L (ref 1.15–1.40)
Chloride: 102 mmol/L (ref 98–111)
GLUCOSE: 103 mg/dL — AB (ref 70–99)
HEMATOCRIT: 41 % (ref 36.0–46.0)
HEMOGLOBIN: 13.9 g/dL (ref 12.0–15.0)
POTASSIUM: 4.1 mmol/L (ref 3.5–5.1)
Sodium: 138 mmol/L (ref 135–145)
TCO2: 27 mmol/L (ref 22–32)

## 2018-04-07 SURGERY — CYST REMOVAL
Anesthesia: General

## 2018-04-07 MED ORDER — PROPOFOL 10 MG/ML IV BOLUS
INTRAVENOUS | Status: DC | PRN
  Administered 2018-04-07: 30 mg via INTRAVENOUS

## 2018-04-07 MED ORDER — CEFAZOLIN SODIUM-DEXTROSE 2-4 GM/100ML-% IV SOLN
2.0000 g | INTRAVENOUS | Status: AC
  Administered 2018-04-07: 2 g via INTRAVENOUS
  Filled 2018-04-07: qty 100

## 2018-04-07 MED ORDER — CHLORHEXIDINE GLUCONATE CLOTH 2 % EX PADS
6.0000 | MEDICATED_PAD | Freq: Once | CUTANEOUS | Status: DC
  Filled 2018-04-07: qty 6

## 2018-04-07 MED ORDER — PROPOFOL 10 MG/ML IV BOLUS
INTRAVENOUS | Status: AC
  Filled 2018-04-07: qty 20

## 2018-04-07 MED ORDER — LIDOCAINE 2% (20 MG/ML) 5 ML SYRINGE
INTRAMUSCULAR | Status: AC
  Filled 2018-04-07: qty 5

## 2018-04-07 MED ORDER — FENTANYL CITRATE (PF) 100 MCG/2ML IJ SOLN
INTRAMUSCULAR | Status: DC | PRN
  Administered 2018-04-07: 50 ug via INTRAVENOUS

## 2018-04-07 MED ORDER — TRAMADOL HCL 50 MG PO TABS: 50.0000 mg | ORAL_TABLET | Freq: Four times a day (QID) | ORAL | 0 refills | Status: DC | PRN

## 2018-04-07 MED ORDER — FENTANYL CITRATE (PF) 100 MCG/2ML IJ SOLN
INTRAMUSCULAR | Status: AC
  Filled 2018-04-07: qty 2

## 2018-04-07 MED ORDER — CEFAZOLIN SODIUM-DEXTROSE 2-4 GM/100ML-% IV SOLN
INTRAVENOUS | Status: AC
  Filled 2018-04-07: qty 100

## 2018-04-07 MED ORDER — BACITRACIN-NEOMYCIN-POLYMYXIN 400-5-5000 EX OINT
TOPICAL_OINTMENT | CUTANEOUS | Status: DC | PRN
  Administered 2018-04-07: 1 via TOPICAL

## 2018-04-07 MED ORDER — LACTATED RINGERS IV SOLN
INTRAVENOUS | Status: DC
  Administered 2018-04-07: 10:00:00 via INTRAVENOUS
  Filled 2018-04-07: qty 1000

## 2018-04-07 MED ORDER — MEPERIDINE HCL 25 MG/ML IJ SOLN
6.2500 mg | INTRAMUSCULAR | Status: DC | PRN
  Filled 2018-04-07: qty 1

## 2018-04-07 MED ORDER — FENTANYL CITRATE (PF) 100 MCG/2ML IJ SOLN
25.0000 ug | INTRAMUSCULAR | Status: DC | PRN
  Filled 2018-04-07: qty 1

## 2018-04-07 MED ORDER — BUPIVACAINE-EPINEPHRINE 0.5% -1:200000 IJ SOLN
INTRAMUSCULAR | Status: DC | PRN
  Administered 2018-04-07: 10 mL

## 2018-04-07 SURGICAL SUPPLY — 38 items
APL SKNCLS STERI-STRIP NONHPOA (GAUZE/BANDAGES/DRESSINGS)
BENZOIN TINCTURE PRP APPL 2/3 (GAUZE/BANDAGES/DRESSINGS) IMPLANT
BLADE CLIPPER SURG (BLADE) IMPLANT
BLADE SURG 15 STRL LF DISP TIS (BLADE) ×1 IMPLANT
BLADE SURG 15 STRL SS (BLADE) ×3
BNDG CONFORM 2 STRL LF (GAUZE/BANDAGES/DRESSINGS) ×4 IMPLANT
CLOSURE WOUND 1/2 X4 (GAUZE/BANDAGES/DRESSINGS)
DRAPE UTILITY XL STRL (DRAPES) ×3 IMPLANT
DRSG TEGADERM 4X4.75 (GAUZE/BANDAGES/DRESSINGS) IMPLANT
ELECT REM PT RETURN 9FT ADLT (ELECTROSURGICAL) ×3
ELECTRODE REM PT RTRN 9FT ADLT (ELECTROSURGICAL) IMPLANT
GAUZE SPONGE 4X4 12PLY STRL LF (GAUZE/BANDAGES/DRESSINGS) ×2 IMPLANT
GLOVE BIOGEL PI IND STRL 7.5 (GLOVE) IMPLANT
GLOVE BIOGEL PI IND STRL 8.5 (GLOVE) IMPLANT
GLOVE BIOGEL PI INDICATOR 7.5 (GLOVE) ×2
GLOVE BIOGEL PI INDICATOR 8.5 (GLOVE) ×2
GLOVE INDICATOR 8.5 STRL (GLOVE) ×2 IMPLANT
GLOVE SURG ORTHO 8.0 STRL STRW (GLOVE) ×3 IMPLANT
KIT TURNOVER CYSTO (KITS) ×3 IMPLANT
MANIFOLD NEPTUNE II (INSTRUMENTS) IMPLANT
NDL HYPO 25X1 1.5 SAFETY (NEEDLE) ×1 IMPLANT
NEEDLE HYPO 25X1 1.5 SAFETY (NEEDLE) ×3 IMPLANT
PENCIL BUTTON HOLSTER BLD 10FT (ELECTRODE) IMPLANT
SPONGE GAUZE 2X2 8PLY STER LF (GAUZE/BANDAGES/DRESSINGS)
SPONGE GAUZE 2X2 8PLY STRL LF (GAUZE/BANDAGES/DRESSINGS) IMPLANT
STRIP CLOSURE SKIN 1/2X4 (GAUZE/BANDAGES/DRESSINGS) IMPLANT
SUT ETHILON 3 0 PS 1 (SUTURE) ×2 IMPLANT
SUT VIC AB 4-0 P-3 18XBRD (SUTURE) IMPLANT
SUT VIC AB 4-0 P3 18 (SUTURE)
SUT VICRYL 3-0 CR8 SH (SUTURE) ×3 IMPLANT
SUT VICRYL 4-0 PS2 18IN ABS (SUTURE) IMPLANT
SWABSTICK POVIDONE IODINE SNGL (MISCELLANEOUS) ×6 IMPLANT
SYR CONTROL 10ML LL (SYRINGE) ×3 IMPLANT
TOWEL OR 17X24 6PK STRL BLUE (TOWEL DISPOSABLE) IMPLANT
TOWEL OR NON WOVEN STRL DISP B (DISPOSABLE) ×3 IMPLANT
TUBE CONNECTING 12'X1/4 (SUCTIONS)
TUBE CONNECTING 12X1/4 (SUCTIONS) IMPLANT
YANKAUER SUCT BULB TIP NO VENT (SUCTIONS) ×2 IMPLANT

## 2018-04-07 NOTE — Anesthesia Preprocedure Evaluation (Addendum)
Anesthesia Evaluation  Patient identified by MRN, date of birth, ID band Patient awake    Reviewed: Allergy & Precautions, H&P , NPO status , Patient's Chart, lab work & pertinent test results, reviewed documented beta blocker date and time   Airway Mallampati: II  TM Distance: >3 FB Neck ROM: full    Dental no notable dental hx. (+) Partial Upper, Poor Dentition, Chipped, Missing   Pulmonary    Pulmonary exam normal breath sounds clear to auscultation       Cardiovascular Exercise Tolerance: Poor hypertension, + DOE  + Valvular Problems/Murmurs AS  Rhythm:regular Rate:Normal  ECHO 10/17 Study Conclusions  - Left ventricle: Wall thickness was increased in a pattern of   severe LVH. Systolic function was normal. The estimated ejection   fraction was in the range of 60% to 65%. Doppler parameters are   consistent with abnormal left ventricular relaxation (grade 1   diastolic dysfunction). Doppler parameters are consistent with   both elevated ventricular end-diastolic filling pressure and   elevated left atrial filling pressure. - Mitral valve: Calcified annulus. - Atrial septum: No defect or patent foramen ovale was identified  Aortic valve sclerosis W/O stenosis   Neuro/Psych    GI/Hepatic   Endo/Other    Renal/GU      Musculoskeletal  (+) Arthritis , Osteoarthritis,    Abdominal   Peds  Hematology   Anesthesia Other Findings   Reproductive/Obstetrics                           Anesthesia Physical Anesthesia Plan  ASA: III  Anesthesia Plan: MAC   Post-op Pain Management:    Induction: Intravenous  PONV Risk Score and Plan: 3 and Treatment may vary due to age or medical condition and Ondansetron  Airway Management Planned: Mask, Natural Airway and Nasal Cannula  Additional Equipment:   Intra-op Plan:   Post-operative Plan:   Informed Consent: I have reviewed the  patients History and Physical, chart, labs and discussed the procedure including the risks, benefits and alternatives for the proposed anesthesia with the patient or authorized representative who has indicated his/her understanding and acceptance.   Dental Advisory Given  Plan Discussed with: CRNA and Anesthesiologist  Anesthesia Plan Comments:        Anesthesia Quick Evaluation

## 2018-04-07 NOTE — Anesthesia Postprocedure Evaluation (Signed)
Anesthesia Post Note  Patient: Ellice C Hai  Procedure(s) Performed: EXCISION EPIDERMAL CYST POSTERIOR NECK (N/A )     Patient location during evaluation: PACU Anesthesia Type: General Level of consciousness: awake and alert Pain management: pain level controlled Vital Signs Assessment: post-procedure vital signs reviewed and stable Respiratory status: spontaneous breathing, nonlabored ventilation, respiratory function stable and patient connected to nasal cannula oxygen Cardiovascular status: blood pressure returned to baseline and stable Postop Assessment: no apparent nausea or vomiting Anesthetic complications: no    Last Vitals:  Vitals:   04/07/18 0906 04/07/18 1145  BP: (!) 158/75 (!) 163/93  Pulse: 80 74  Resp: 16 19  Temp: (!) 36.4 C (!) 36.4 C  SpO2: 96% 96%    Last Pain:  Vitals:   04/07/18 1145  TempSrc:   PainSc: 0-No pain                 Miklos Bidinger

## 2018-04-07 NOTE — Anesthesia Procedure Notes (Signed)
Performed by: Elisse Pennick T, CRNA       

## 2018-04-07 NOTE — Op Note (Signed)
NAMEMERIBETH, VITUG MEDICAL RECORD [id number] ACCOUNT [id number] DATE OF BIRTH:25-Dec-1925 FACILITY: [facility] LOCATION: [facility] PHYSICIAN:Jhaden Pizzuto Leeanne Mannan, MD  OPERATIVE REPORT  DATE OF PROCEDURE:  04/07/2018  PREOPERATIVE DIAGNOSIS:  Epidermal cyst with abscess, posterior scalp/upper neck.  POSTOPERATIVE DIAGNOSIS:  Epidermal cyst with abscess, posterior scalp/upper neck.  PROCEDURE:  Excision epidermal cyst, posterior scalp and neck, 3 x 2.5 x 2 cm.  ASSISTANT:  Armandina Gemma, M.D.  ANESTHESIA:  Intravenous sedation with local Marcaine with epinephrine.  ESTIMATED BLOOD LOSS:  Minimal.  PREPARATION:  Betadine.  COMPLICATIONS:  None.  INDICATIONS:  The patient is an 84 year old female referred by her primary care physician for excision of an epidermal cyst with abscess on the posterior scalp and neck.    Procedure is done in OR #1 at the [facility] loss [place].  DESCRIPTION OF PROCEDURE:  The patient was brought to the operating room and placed on the operating room table in a right lateral decubitus position.  Hair was removed from around the cyst on the posterior scalp and upper neck.  Wound was prepped with  Betadine.  After administration of intravenous sedation, a local anesthetic was infiltrated circumferentially and beneath the mass.  Using a #15 blade, an elliptical incision was made so as to remove the entire mass.  Electrocautery was used for  hemostasis.  The entire cyst was dissected out of the subcutaneous tissues down to the fascia.  Entire mass was excised and submitted to pathology for review.  Hemostasis was achieved with the electrocautery.  Skin flaps were developed circumferentially with electrocautery.  Skin edges were reapproximated primarily with interrupted simple and vertical mattress sutures using 3-0 nylon.  The entire wound was closed.  Triple antibiotic ointment was placed on the  surface of the wound followed by a dry gauze dressing.    The patient  was awakened from sedation and brought to the recovery room.  The patient tolerated the procedure well.  Armandina Gemma, MD [facility] Surgery Office: [phone]    AN/NUANCE  D:04/07/2018 T:04/07/2018 JOB:[id number]

## 2018-04-07 NOTE — Interval H&P Note (Signed)
History and Physical Interval Note:  04/07/2018 10:45 AM  Tiffany [name]  has presented today for surgery, with the diagnosis of epidermal inclusion cyst posterior neck with abscess  The various methods of treatment have been discussed with the patient and family. After consideration of risks, benefits and other options for treatment, the patient has consented to    Procedure(s): EXCISION EPIDERMAL CYST POSTERIOR NECK (N/A) as a surgical intervention .    The patient's history has been reviewed, patient examined, no change in status, stable for surgery.  I have reviewed the patient's chart and labs.  Questions were answered to the patient's satisfaction.    Armandina Gemma, [place] Office: [place]

## 2018-04-07 NOTE — Brief Op Note (Signed)
04/07/2018  11:33 AM  PATIENT:  Tiffany [name]  84 y.o. female  PRE-OPERATIVE DIAGNOSIS:  epidermal inclusion cyst posterior neck with abscess  POST-OPERATIVE DIAGNOSIS:  epidermal inclusion cyst posterior neck with abscess  PROCEDURE:  Procedure(s): EXCISION EPIDERMAL CYST POSTERIOR NECK (N/A)  SURGEON:  Surgeon(s) and Role:    Armandina Gemma, MD - Primary  ANESTHESIA:   IV sedation  EBL:  minimal  BLOOD ADMINISTERED:none  DRAINS: none   LOCAL MEDICATIONS USED:  MARCAINE     SPECIMEN:  Excision  DISPOSITION OF SPECIMEN:  PATHOLOGY  COUNTS:  YES  TOURNIQUET:  * No tourniquets in log *  DICTATION: .Other Dictation: Dictation Number [id number]  PLAN OF CARE: Discharge to home after PACU  PATIENT DISPOSITION:  PACU - hemodynamically stable.   Delay start of Pharmacological VTE agent (>24hrs) due to surgical blood loss or risk of bleeding: yes  Armandina Gemma, MD [facility] [place] Surgery Office: [phone]

## 2018-04-07 NOTE — Anesthesia Procedure Notes (Signed)
Procedure Name: MAC Performed by: Bonney Aid, CRNA Pre-anesthesia Checklist: Patient identified, Emergency Drugs available, Suction available, Patient being monitored and Timeout performed Patient Re-evaluated:Patient Re-evaluated prior to induction

## 2018-04-07 NOTE — Discharge Instructions (Signed)

## 2018-04-07 NOTE — Transfer of Care (Signed)
Immediate Anesthesia Transfer of Care Note  Patient: Tiffany [name]  Procedure(s) Performed: EXCISION EPIDERMAL CYST POSTERIOR NECK (N/A )  Patient Location: PACU  Anesthesia Type:MAC  Level of Consciousness: awake, alert  and oriented  Airway & Oxygen Therapy: Patient Spontanous Breathing  Post-op Assessment: Report given to RN  Post vital signs: Reviewed and stable  Last Vitals:  Vitals Value Taken Time  BP    Temp    Pulse    Resp    SpO2      Last Pain:  Vitals:   04/07/18 0906  TempSrc: Oral         Complications: No apparent anesthesia complications

## 2018-04-08 ENCOUNTER — Encounter (HOSPITAL_BASED_OUTPATIENT_CLINIC_OR_DEPARTMENT_OTHER): Payer: Self-pay | Admitting: Surgery

## 2018-04-08 NOTE — Anesthesia Postprocedure Evaluation (Signed)
Anesthesia Post Note  Patient: Tiffany [name]  Procedure(s) Performed: EXCISION EPIDERMAL CYST POSTERIOR NECK (N/A )     Patient location during evaluation: PACU Anesthesia Type: MAC Level of consciousness: awake and alert Pain management: pain level controlled Vital Signs Assessment: post-procedure vital signs reviewed and stable Respiratory status: spontaneous breathing, nonlabored ventilation, respiratory function stable and patient connected to nasal cannula oxygen Cardiovascular status: stable and blood pressure returned to baseline Postop Assessment: no apparent nausea or vomiting Anesthetic complications: no    Last Vitals:  Vitals:   04/07/18 1145 04/07/18 1215  BP: (!) 163/93 (!) 144/64  Pulse: 74 71  Resp: 19 18  Temp: (!) 36.4 C   SpO2: 96% 96%    Last Pain:  Vitals:   04/07/18 1215  TempSrc:   PainSc: 0-No pain                 Viral Schramm

## 2018-04-08 NOTE — Addendum Note (Signed)
Addendum  created 04/08/18 1008 by Janeece Riggers, MD   Sign clinical note

## 2018-04-12 ENCOUNTER — Encounter: Payer: Self-pay | Admitting: Interventional Cardiology

## 2018-04-12 ENCOUNTER — Ambulatory Visit (INDEPENDENT_AMBULATORY_CARE_PROVIDER_SITE_OTHER): Payer: Medicare Other | Admitting: Interventional Cardiology

## 2018-04-12 VITALS — BP 144/68 | HR 80 | Ht 64.0 in | Wt 155.0 lb

## 2018-04-12 DIAGNOSIS — I10 Essential (primary) hypertension: Secondary | ICD-10-CM | POA: Diagnosis not present

## 2018-04-12 DIAGNOSIS — I421 Obstructive hypertrophic cardiomyopathy: Secondary | ICD-10-CM | POA: Diagnosis not present

## 2018-04-12 MED ORDER — DILTIAZEM HCL ER COATED BEADS 180 MG PO CP24: 180.0000 mg | ORAL_CAPSULE | Freq: Every day | ORAL | 3 refills | Status: DC

## 2018-04-12 NOTE — Progress Notes (Signed)
Cardiology Office Note:    Date:  04/12/2018   ID:  Tiffany [name], DOB 06-13-1926, MRN [id number]  PCP:  Lawerance Cruel, MD  Cardiologist:  No primary care provider on file.   Referring MD: Lawerance Cruel, MD   Chief Complaint  Patient presents with  . Congestive Heart Failure    Hypertrophic cardiomyopathy    History of Present Illness:    Tiffany [name] is a 84 y.o. female with a hx of hypertrophic cardiomyopathy. EF 65%. Concentric hypertrophy measuring 18 mm.   She is here only because she is running out of diltiazem CD.  She does have LV outflow obstruction.  Diuretic therapy was removed and diltiazem CD 180 mg/day was started over a year ago.  No side effects on diltiazem.  She denies dyspnea, orthopnea, angina, and syncope.  No peripheral edema.   Past Medical History:  Diagnosis Date  . Aortic stenosis, mild    ECHO 09  . Bilateral cataracts   . DOE (dyspnea on exertion)   . Facial basal cell cancer    nose-/dr, Allyson Sabal  . Hearing loss in left ear    resolved after stapedectomy  . Heart murmur   . History of colon polyps   . Hyperlipidemia   . Hypertension   . Hypertrophic obstructive cardiomyopathy ([place])   . OA (osteoarthritis)   . Osteoporosis   . Retinal vein occlusion    Dr. Starling Manns, Left eye, receives injections  . Varicose veins   . Vitamin D deficiency     Past Surgical History:  Procedure Laterality Date  . COLONOSCOPY  04/2000  . COLONOSCOPY W/ POLYPECTOMY  1988  . CYST EXCISION N/A 04/07/2018   Procedure: EXCISION EPIDERMAL CYST POSTERIOR NECK;  Surgeon: Armandina Gemma, MD;  Location: [facility];  Service: General;  Laterality: N/A;  . ELBOW SURGERY    . EXTERNAL EAR SURGERY Right 1974   Stapedectomy  . EYE SURGERY Bilateral    cataract  . SHOULDER SURGERY    . VAGINAL HYSTERECTOMY     without oopherectomy for birth control age 64    Current Medications: Current Meds  Medication Sig  . aspirin EC 81 MG tablet  Take 81 mg by mouth daily.  . Cholecalciferol (VITAMIN D) 2000 units CAPS Take 1 capsule by mouth daily.  Marland Kitchen diltiazem (CARDIZEM CD) 180 MG 24 hr capsule Take 1 capsule (180 mg total) by mouth daily.  Marland Kitchen lisinopril-hydrochlorothiazide (PRINZIDE,ZESTORETIC) 20-25 MG tablet Take 1 tablet by mouth daily.  . Misc Natural Products (OSTEO BI-FLEX ADV DOUBLE ST PO) Take 1 tablet by mouth daily.  . Multiple Vitamins-Minerals (PRESERVISION AREDS) CAPS Take 1 capsule by mouth daily.  . raloxifene (EVISTA) 60 MG tablet Take 60 mg by mouth daily.  . traMADol (ULTRAM) 50 MG tablet Take 1-2 tablets (50-100 mg total) by mouth every 6 (six) hours as needed for moderate pain.  . [DISCONTINUED] diltiazem (CARDIZEM CD) 180 MG 24 hr capsule Take 180 mg by mouth daily.     Allergies:   Other   Social History   Socioeconomic History  . Marital status: Divorced    Spouse name: Not on file  . Number of children: 5  . Years of education: college  . Highest education level: Not on file  Occupational History  . Occupation: retired  Scientific laboratory technician  . Financial resource strain: Not on file  . Food insecurity:    Worry: Not on file    Inability: Not on  file  . Transportation needs:    Medical: Not on file    Non-medical: Not on file  Tobacco Use  . Smoking status: Never Smoker  . Smokeless tobacco: Never Used  Substance and Sexual Activity  . Alcohol use: Yes    Comment: occ  . Drug use: No  . Sexual activity: Not on file  Lifestyle  . Physical activity:    Days per week: Not on file    Minutes per session: Not on file  . Stress: Not on file  Relationships  . Social connections:    Talks on phone: Not on file    Gets together: Not on file    Attends religious service: Not on file    Active member of club or organization: Not on file    Attends meetings of clubs or organizations: Not on file    Relationship status: Not on file  Other Topics Concern  . Not on file  Social History Narrative  . Not  on file     Family History: The patient's family history includes Breast cancer in her sister; CAD in her father; Cerebral aneurysm in her mother; Colon cancer in her brother; Hypertension in her brother, mother, son, and son; Melanoma in her mother.  ROS:   Please see the history of present illness.    Chest pain, shortness of breath, but otherwise no complaints.  She does not use tobacco.  All other systems reviewed and are negative.  EKGs/Labs/Other Studies Reviewed:    The following studies were reviewed today and   Most recent echo July 01, 2016: Study Conclusions  - Left ventricle: Wall thickness was increased in a pattern of   severe LVH. Systolic function was normal. The estimated ejection   fraction was in the range of 60% to 65%. Doppler parameters are   consistent with abnormal left ventricular relaxation (grade 1   diastolic dysfunction). Doppler parameters are consistent with   both elevated ventricular end-diastolic filling pressure and   elevated left atrial filling pressure. - Mitral valve: Calcified annulus. - Atrial septum: No defect or patent foramen ovale was identified.     EKG:  EKG is not ordered today.    Recent Labs: 04/07/2018: BUN 20; Creatinine, Ser 0.80; Hemoglobin 13.9; Potassium 4.1; Sodium 138  Recent Lipid Panel No results found for: CHOL, TRIG, HDL, CHOLHDL, VLDL, LDLCALC, LDLDIRECT  Physical Exam:    VS:  BP (!) 144/68   Pulse 80   Ht 5\' 4"  (1.626 m)   Wt 155 lb (70.3 kg)   BMI 26.61 kg/m     Wt Readings from Last 3 Encounters:  04/12/18 155 lb (70.3 kg)  04/07/18 154 lb 3.2 oz (69.9 kg)  01/20/17 153 lb (69.4 kg)     GEN:  Well nourished, well developed in no acute distress and younger than her stated age. HEENT: Normal NECK: No JVD. LYMPHATICS: No lymphadenopathy CARDIAC: RRR, harsh left upper sternal holosystolic murmur which intensifies with standing from the lying position.  There is an S or gallop, no  edema. VASCULAR: 2+ and carotid, radial, and posterior tibial pulses.  No bruits. RESPIRATORY:  Clear to auscultation without rales, wheezing or rhonchi  ABDOMEN: Soft, non-tender, non-distended, No pulsatile mass, MUSCULOSKELETAL: No deformity  SKIN: Warm and dry NEUROLOGIC:  Alert and oriented x 3 PSYCHIATRIC:  Normal affect   ASSESSMENT:    1. Hypertrophic obstructive cardiomyopathy ([place])   2. Essential hypertension    PLAN:    In order  of problems listed above:  1. Doing well clinically.  Bleeding on active lifestyle.  Diltiazem 180 mg/day of the controlled delivery preparation will be continued. 2. Target blood pressure less than 140/90 mmHg.  Continue to monitor.  Low-salt diet.  9 to 39-month follow-up.  Diltiazem CD is renewed.   Medication Adjustments/Labs and Tests Ordered: Current medicines are reviewed at length with the patient today.  Concerns regarding medicines are outlined above.  No orders of the defined types were placed in this encounter.  Meds ordered this encounter  Medications  . diltiazem (CARDIZEM CD) 180 MG 24 hr capsule    Sig: Take 1 capsule (180 mg total) by mouth daily.    Dispense:  90 capsule    Refill:  3    Patient Instructions  Medication Instructions:  Your physician recommends that you continue on your current medications as directed. Please refer to the Current Medication list given to you today.  Labwork: None  Testing/Procedures: None  Follow-Up: Your physician wants you to follow-up in: 9-12 months with Dr. Tamala Julian.  You will receive a reminder letter in the mail two months in advance. If you don't receive a letter, please call our office to schedule the follow-up appointment.   Any Other Special Instructions Will Be Listed Below (If Applicable).     If you need a refill on your cardiac medications before your next appointment, please call your pharmacy.      Signed, Sinclair Grooms, MD  04/12/2018 2:36 PM    [facility]

## 2018-04-12 NOTE — Patient Instructions (Signed)
Medication Instructions:  Your physician recommends that you continue on your current medications as directed. Please refer to the Current Medication list given to you today.  Labwork: None  Testing/Procedures: None  Follow-Up: Your physician wants you to follow-up in: 9-12 months with Dr. Smith.  You will receive a reminder letter in the mail two months in advance. If you don't receive a letter, please call our office to schedule the follow-up appointment.   Any Other Special Instructions Will Be Listed Below (If Applicable).     If you need a refill on your cardiac medications before your next appointment, please call your pharmacy.   

## 2018-06-07 DIAGNOSIS — H353132 Nonexudative age-related macular degeneration, bilateral, intermediate dry stage: Secondary | ICD-10-CM | POA: Diagnosis not present

## 2018-06-07 DIAGNOSIS — H3581 Retinal edema: Secondary | ICD-10-CM | POA: Diagnosis not present

## 2018-06-07 DIAGNOSIS — Z961 Presence of intraocular lens: Secondary | ICD-10-CM | POA: Diagnosis not present

## 2018-06-07 DIAGNOSIS — H34832 Tributary (branch) retinal vein occlusion, left eye, with macular edema: Secondary | ICD-10-CM | POA: Diagnosis not present

## 2018-06-07 DIAGNOSIS — H35033 Hypertensive retinopathy, bilateral: Secondary | ICD-10-CM | POA: Diagnosis not present

## 2018-09-01 DIAGNOSIS — M810 Age-related osteoporosis without current pathological fracture: Secondary | ICD-10-CM | POA: Diagnosis not present

## 2018-09-01 DIAGNOSIS — E7800 Pure hypercholesterolemia, unspecified: Secondary | ICD-10-CM | POA: Diagnosis not present

## 2018-09-01 DIAGNOSIS — E559 Vitamin D deficiency, unspecified: Secondary | ICD-10-CM | POA: Diagnosis not present

## 2018-09-01 DIAGNOSIS — Z23 Encounter for immunization: Secondary | ICD-10-CM | POA: Diagnosis not present

## 2018-09-01 DIAGNOSIS — Z0000 Encounter for general adult medical examination without abnormal findings: Secondary | ICD-10-CM | POA: Diagnosis not present

## 2018-09-01 DIAGNOSIS — I10 Essential (primary) hypertension: Secondary | ICD-10-CM | POA: Diagnosis not present

## 2018-09-01 DIAGNOSIS — Z1389 Encounter for screening for other disorder: Secondary | ICD-10-CM | POA: Diagnosis not present

## 2018-09-01 DIAGNOSIS — M179 Osteoarthritis of knee, unspecified: Secondary | ICD-10-CM | POA: Diagnosis not present

## 2018-09-06 DIAGNOSIS — M810 Age-related osteoporosis without current pathological fracture: Secondary | ICD-10-CM | POA: Diagnosis not present

## 2018-09-06 DIAGNOSIS — E559 Vitamin D deficiency, unspecified: Secondary | ICD-10-CM | POA: Diagnosis not present

## 2018-09-06 DIAGNOSIS — I10 Essential (primary) hypertension: Secondary | ICD-10-CM | POA: Diagnosis not present

## 2018-09-06 DIAGNOSIS — E7800 Pure hypercholesterolemia, unspecified: Secondary | ICD-10-CM | POA: Diagnosis not present

## 2018-09-06 DIAGNOSIS — M179 Osteoarthritis of knee, unspecified: Secondary | ICD-10-CM | POA: Diagnosis not present

## 2018-10-03 NOTE — Progress Notes (Signed)
Cardiology Office Note:    Date:  10/04/2018   ID:  Tiffany [name], DOB 07-07-1926, MRN [id number]  PCP:  Tiffany Cruel, MD  Cardiologist:  No primary care provider on file.   Referring MD: Tiffany Cruel, MD   Chief Complaint  Patient presents with  . Chest Pain  . Cardiomyopathy    History of Present Illness:    Tiffany [name] is a 85 y.o. female with a hx of  hypertrophic cardiomyopathy (EF 65% concentric hypertrophy measuring 18 mm).   She has been having left parasternal fleeting episodes of discomfort that lasts seconds and goes away.  These episodes occur mostly at rest.  They are not precipitated at all by physical activity.  She denies, prolonged palpitations, syncope, and anginal quality discomfort.  She is not having lower extremity swelling and denies PND.  She has chronic dyspnea on exertion.  Past Medical History:  Diagnosis Date  . Aortic stenosis, mild    ECHO 09  . Bilateral cataracts   . DOE (dyspnea on exertion)   . Facial basal cell cancer    nose-/dr, Tiffany [name]  . Hearing loss in left ear    resolved after stapedectomy  . Heart murmur   . History of colon polyps   . Hyperlipidemia   . Hypertension   . Hypertrophic obstructive cardiomyopathy (Tiffany [name])   . OA (osteoarthritis)   . Osteoporosis   . Retinal vein occlusion    Dr. Starling Manns, Left eye, receives injections  . Varicose veins   . Vitamin D deficiency     Past Surgical History:  Procedure Laterality Date  . COLONOSCOPY  04/2000  . COLONOSCOPY W/ POLYPECTOMY  1988  . CYST EXCISION N/A 04/07/2018   Procedure: EXCISION EPIDERMAL CYST POSTERIOR NECK;  Surgeon: Tiffany Gemma, MD;  Location: [facility];  Service: General;  Laterality: N/A;  . ELBOW SURGERY    . EXTERNAL EAR SURGERY Right 1974   Stapedectomy  . EYE SURGERY Bilateral    cataract  . SHOULDER SURGERY    . VAGINAL HYSTERECTOMY     without oopherectomy for birth control age 12    Current  Medications: Current Meds  Medication Sig  . aspirin EC 81 MG tablet Take 81 mg by mouth daily.  . Cholecalciferol (VITAMIN D) 2000 units CAPS Take 1 capsule by mouth daily.  Marland Kitchen diltiazem (CARDIZEM CD) 180 MG 24 hr capsule Take 1 capsule (180 mg total) by mouth daily.  Marland Kitchen lisinopril-hydrochlorothiazide (PRINZIDE,ZESTORETIC) 20-25 MG tablet Take 1 tablet by mouth daily.  . Misc Natural Products (OSTEO BI-FLEX ADV DOUBLE ST PO) Take 1 tablet by mouth daily.  . Multiple Vitamins-Minerals (PRESERVISION AREDS) CAPS Take 1 capsule by mouth daily.     Allergies:   Other   Social History   Socioeconomic History  . Marital status: Divorced    Spouse name: Not on file  . Number of children: 5  . Years of education: college  . Highest education level: Not on file  Occupational History  . Occupation: retired  Scientific laboratory technician  . Financial resource strain: Not on file  . Food insecurity:    Worry: Not on file    Inability: Not on file  . Transportation needs:    Medical: Not on file    Non-medical: Not on file  Tobacco Use  . Smoking status: Never Smoker  . Smokeless tobacco: Never Used  Substance and Sexual Activity  . Alcohol use: Yes    Comment: occ  .  Drug use: No  . Sexual activity: Not on file  Lifestyle  . Physical activity:    Days per week: Not on file    Minutes per session: Not on file  . Stress: Not on file  Relationships  . Social connections:    Talks on phone: Not on file    Gets together: Not on file    Attends religious service: Not on file    Active member of club or organization: Not on file    Attends meetings of clubs or organizations: Not on file    Relationship status: Not on file  Other Topics Concern  . Not on file  Social History Narrative  . Not on file     Family History: The patient's family history includes Breast cancer in her sister; CAD in her father; Cerebral aneurysm in her mother; Colon cancer in her brother; Hypertension in her brother,  mother, son, and son; Melanoma in her mother.  ROS:   Please see the history of present illness.    Some pain, back pain, occasional difficulty falling asleep.  All other systems reviewed and are negative.  EKGs/Labs/Other Studies Reviewed:    The following studies were reviewed today: 2D Doppler echocardiogram October 2017: Study Conclusions  - Left ventricle: Wall thickness was increased in a pattern of   severe LVH. Systolic function was normal. The estimated ejection   fraction was in the range of 60% to 65%. Doppler parameters are   consistent with abnormal left ventricular relaxation (grade 1   diastolic dysfunction). Doppler parameters are consistent with   both elevated ventricular end-diastolic filling pressure and   elevated left atrial filling pressure. - Mitral valve: Calcified annulus. - Atrial septum: No defect or patent foramen ovale was identified.  EKG:  EKG is performed today and demonstrates right bundle, sinus rhythm, biatrial abnormality.  When compared to the EKG tracing from July 2019, the right bundle branch block is new.  An isolated PVC is also noted.  Recent Labs: 04/07/2018: BUN 20; Creatinine, Ser 0.80; Hemoglobin 13.9; Potassium 4.1; Sodium 138  Recent Lipid Panel No results found for: CHOL, TRIG, HDL, CHOLHDL, VLDL, LDLCALC, LDLDIRECT  Physical Exam:    VS:  BP 132/68   Pulse 72   Ht 5\' 4"  (1.626 m)   Wt 153 lb 9.6 oz (69.7 kg)   SpO2 98%   BMI 26.37 kg/m     Wt Readings from Last 3 Encounters:  10/04/18 153 lb 9.6 oz (69.7 kg)  04/12/18 155 lb (70.3 kg)  04/07/18 154 lb 3.2 oz (69.9 kg)     GEN: Consistent with stated age.  She appears to be more fit than usual for her age.. No acute distress HEENT: Normal NECK: No JVD. LYMPHATICS: No lymphadenopathy CARDIAC: RRR.  2/6 left parasternal systolic murmur that increases to 3/6 to 4/6 systolic murmur with standing.  An S4 gallop is present but no edema VASCULAR: Pulses radial and carotid 2+  bilateral, Bruits bilateral transmitted from aorta RESPIRATORY:  Clear to auscultation without rales, wheezing or rhonchi  ABDOMEN: Soft, non-tender, non-distended, No pulsatile mass, MUSCULOSKELETAL: No deformity  SKIN: Warm and dry NEUROLOGIC:  Alert and oriented x 3 PSYCHIATRIC:  Normal affect   ASSESSMENT:    1. Chest pain, unspecified type   2. Hypertrophic obstructive cardiomyopathy ([place])   3. Essential hypertension   4. DOE (dyspnea on exertion)   5. Right bundle branch block    PLAN:    In order of problems  listed above:  1. Atypical and has features of musculoskeletal discomfort.  No further evaluation needed. 2. Murmur is consistent with hypertrophic cardiomyopathy with accentuation when standing.  No specific evaluation as the patient is still very active.  She walks for exercise on a daily basis.  No exertion related chest discomfort. 3. Excellent current blood pressure control.  She is on diltiazem and Zestoretic.  Target 130/80 mmHg. 4. Chronic and unchanged from historical over the past 10 to 12 years.  No orthopnea. 5. Newly recognized on today's EKG.  An occasional PVC is also noted.  No specific work-up or change at this time.   Medication Adjustments/Labs and Tests Ordered: Current medicines are reviewed at length with the patient today.  Concerns regarding medicines are outlined above.  Orders Placed This Encounter  Procedures  . EKG 12-Lead   No orders of the defined types were placed in this encounter.   Patient Instructions  Medication Instructions:  Your physician recommends that you continue on your current medications as directed. Please refer to the Current Medication list given to you today.  If you need a refill on your cardiac medications before your next appointment, please call your pharmacy.   Lab work: None If you have labs (blood work) drawn today and your tests are completely normal, you will receive your results only by: Marland Kitchen MyChart  Message (if you have MyChart) OR . A paper copy in the mail If you have any lab test that is abnormal or we need to change your treatment, we will call you to review the results.  Testing/Procedures: None  Follow-Up: At [facility], you and your health needs are our priority.  As part of our continuing mission to provide you with exceptional heart care, we have created designated Provider Care Teams.  These Care Teams include your primary Cardiologist (physician) and Advanced Practice Providers (APPs -  Physician Assistants and Nurse Practitioners) who all work together to provide you with the care you need, when you need it. You will need a follow up appointment in 9-12 months.  Please call our office 2 months in advance to schedule this appointment.  You may see Dr. Tamala Julian or one of the following Advanced Practice Providers on your designated Care Team:   Truitt Merle, NP Cecilie Kicks, NP . Kathyrn Drown, NP  Any Other Special Instructions Will Be Listed Below (If Applicable).       Signed, Sinclair Grooms, MD  10/04/2018 11:15 AM    [place]

## 2018-10-04 ENCOUNTER — Encounter: Payer: Self-pay | Admitting: Interventional Cardiology

## 2018-10-04 ENCOUNTER — Ambulatory Visit (INDEPENDENT_AMBULATORY_CARE_PROVIDER_SITE_OTHER): Payer: Medicare Other | Admitting: Interventional Cardiology

## 2018-10-04 VITALS — BP 132/68 | HR 72 | Ht 64.0 in | Wt 153.6 lb

## 2018-10-04 DIAGNOSIS — I10 Essential (primary) hypertension: Secondary | ICD-10-CM | POA: Diagnosis not present

## 2018-10-04 DIAGNOSIS — I421 Obstructive hypertrophic cardiomyopathy: Secondary | ICD-10-CM

## 2018-10-04 DIAGNOSIS — I4510 Unspecified right bundle-branch block: Secondary | ICD-10-CM | POA: Diagnosis not present

## 2018-10-04 DIAGNOSIS — R079 Chest pain, unspecified: Secondary | ICD-10-CM

## 2018-10-04 DIAGNOSIS — R0609 Other forms of dyspnea: Secondary | ICD-10-CM

## 2018-10-04 NOTE — Patient Instructions (Signed)
Medication Instructions:  Your physician recommends that you continue on your current medications as directed. Please refer to the Current Medication list given to you today.  If you need a refill on your cardiac medications before your next appointment, please call your pharmacy.   Lab work: None If you have labs (blood work) drawn today and your tests are completely normal, you will receive your results only by: . MyChart Message (if you have MyChart) OR . A paper copy in the mail If you have any lab test that is abnormal or we need to change your treatment, we will call you to review the results.  Testing/Procedures: None  Follow-Up: At [facility], you and your health needs are our priority.  As part of our continuing mission to provide you with exceptional heart care, we have created designated Provider Care Teams.  These Care Teams include your primary Cardiologist (physician) and Advanced Practice Providers (APPs -  Physician Assistants and Nurse Practitioners) who all work together to provide you with the care you need, when you need it. You will need a follow up appointment in 9-12 months.  Please call our office 2 months in advance to schedule this appointment.  You may see Dr. Smith or one of the following Advanced Practice Providers on your designated Care Team:   Lori Gerhardt, NP Laura Ingold, NP . Jill McDaniel, NP  Any Other Special Instructions Will Be Listed Below (If Applicable).    

## 2018-10-25 DIAGNOSIS — H3581 Retinal edema: Secondary | ICD-10-CM | POA: Diagnosis not present

## 2018-10-25 DIAGNOSIS — H34832 Tributary (branch) retinal vein occlusion, left eye, with macular edema: Secondary | ICD-10-CM | POA: Diagnosis not present

## 2018-11-09 DIAGNOSIS — S29012A Strain of muscle and tendon of back wall of thorax, initial encounter: Secondary | ICD-10-CM | POA: Diagnosis not present

## 2018-11-09 DIAGNOSIS — M25512 Pain in left shoulder: Secondary | ICD-10-CM | POA: Diagnosis not present

## 2018-11-16 DIAGNOSIS — R109 Unspecified abdominal pain: Secondary | ICD-10-CM | POA: Diagnosis not present

## 2018-11-16 DIAGNOSIS — K5900 Constipation, unspecified: Secondary | ICD-10-CM | POA: Diagnosis not present

## 2018-11-23 ENCOUNTER — Other Ambulatory Visit: Payer: Self-pay | Admitting: Family Medicine

## 2018-11-23 DIAGNOSIS — R1013 Epigastric pain: Secondary | ICD-10-CM

## 2018-11-24 ENCOUNTER — Ambulatory Visit
Admission: RE | Admit: 2018-11-24 | Discharge: 2018-11-24 | Disposition: A | Discharge status: Home / Self Care | Payer: Medicare Other | Source: Ambulatory Visit | Attending: Family Medicine | Admitting: Family Medicine

## 2018-11-24 DIAGNOSIS — R1013 Epigastric pain: Secondary | ICD-10-CM

## 2018-11-24 DIAGNOSIS — K862 Cyst of pancreas: Secondary | ICD-10-CM | POA: Diagnosis not present

## 2018-11-24 DIAGNOSIS — K7689 Other specified diseases of liver: Secondary | ICD-10-CM | POA: Diagnosis not present

## 2018-11-25 ENCOUNTER — Encounter (HOSPITAL_COMMUNITY): Payer: Self-pay | Admitting: Emergency Medicine

## 2018-11-25 ENCOUNTER — Emergency Department (HOSPITAL_COMMUNITY): Payer: Medicare Other

## 2018-11-25 ENCOUNTER — Emergency Department (HOSPITAL_COMMUNITY)
Admission: EM | Admit: 2018-11-25 | Discharge: 2018-11-25 | Disposition: A | Discharge status: Home / Self Care | Payer: Medicare Other | Attending: Emergency Medicine | Admitting: Emergency Medicine

## 2018-11-25 DIAGNOSIS — C259 Malignant neoplasm of pancreas, unspecified: Secondary | ICD-10-CM | POA: Insufficient documentation

## 2018-11-25 DIAGNOSIS — R109 Unspecified abdominal pain: Secondary | ICD-10-CM | POA: Diagnosis not present

## 2018-11-25 DIAGNOSIS — C786 Secondary malignant neoplasm of retroperitoneum and peritoneum: Secondary | ICD-10-CM | POA: Diagnosis not present

## 2018-11-25 DIAGNOSIS — Z85828 Personal history of other malignant neoplasm of skin: Secondary | ICD-10-CM | POA: Diagnosis not present

## 2018-11-25 DIAGNOSIS — C801 Malignant (primary) neoplasm, unspecified: Secondary | ICD-10-CM | POA: Diagnosis not present

## 2018-11-25 DIAGNOSIS — I10 Essential (primary) hypertension: Secondary | ICD-10-CM | POA: Insufficient documentation

## 2018-11-25 DIAGNOSIS — R935 Abnormal findings on diagnostic imaging of other abdominal regions, including retroperitoneum: Secondary | ICD-10-CM | POA: Diagnosis not present

## 2018-11-25 DIAGNOSIS — R52 Pain, unspecified: Secondary | ICD-10-CM

## 2018-11-25 DIAGNOSIS — Z79899 Other long term (current) drug therapy: Secondary | ICD-10-CM | POA: Insufficient documentation

## 2018-11-25 DIAGNOSIS — R1031 Right lower quadrant pain: Secondary | ICD-10-CM | POA: Diagnosis present

## 2018-11-25 LAB — CBC WITH DIFFERENTIAL/PLATELET
ABS IMMATURE GRANULOCYTES: 0.02 10*3/uL (ref 0.00–0.07)
Basophils Absolute: 0 10*3/uL (ref 0.0–0.1)
Basophils Relative: 1 %
Eosinophils Absolute: 0.1 10*3/uL (ref 0.0–0.5)
Eosinophils Relative: 3 %
HCT: 39 % (ref 36.0–46.0)
Hemoglobin: 12.7 g/dL (ref 12.0–15.0)
Immature Granulocytes: 0 %
Lymphocytes Relative: 14 %
Lymphs Abs: 0.7 10*3/uL (ref 0.7–4.0)
MCH: 29.3 pg (ref 26.0–34.0)
MCHC: 32.6 g/dL (ref 30.0–36.0)
MCV: 89.9 fL (ref 80.0–100.0)
MONO ABS: 0.6 10*3/uL (ref 0.1–1.0)
Monocytes Relative: 13 %
NEUTROS ABS: 3.3 10*3/uL (ref 1.7–7.7)
Neutrophils Relative %: 69 %
Platelets: 259 10*3/uL (ref 150–400)
RBC: 4.34 MIL/uL (ref 3.87–5.11)
RDW: 13.1 % (ref 11.5–15.5)
WBC: 4.8 10*3/uL (ref 4.0–10.5)
nRBC: 0 % (ref 0.0–0.2)

## 2018-11-25 LAB — URINALYSIS, ROUTINE W REFLEX MICROSCOPIC
Bilirubin Urine: NEGATIVE
Glucose, UA: NEGATIVE mg/dL
Hgb urine dipstick: NEGATIVE
Ketones, ur: 5 mg/dL — AB
Nitrite: NEGATIVE
Protein, ur: NEGATIVE mg/dL
Specific Gravity, Urine: 1.015 (ref 1.005–1.030)
pH: 6 (ref 5.0–8.0)

## 2018-11-25 LAB — LIPASE, BLOOD: Lipase: 30 U/L (ref 11–51)

## 2018-11-25 LAB — COMPREHENSIVE METABOLIC PANEL
ALT: 21 U/L (ref 0–44)
AST: 23 U/L (ref 15–41)
Albumin: 3.5 g/dL (ref 3.5–5.0)
Alkaline Phosphatase: 51 U/L (ref 38–126)
Anion gap: 9 (ref 5–15)
BILIRUBIN TOTAL: 0.6 mg/dL (ref 0.3–1.2)
BUN: 15 mg/dL (ref 8–23)
CALCIUM: 9.9 mg/dL (ref 8.9–10.3)
CO2: 24 mmol/L (ref 22–32)
CREATININE: 0.84 mg/dL (ref 0.44–1.00)
Chloride: 99 mmol/L (ref 98–111)
GFR calc non Af Amer: >60 mL/min (ref >60)
GLUCOSE: 101 mg/dL — AB (ref 70–99)
Potassium: 3.4 mmol/L — ABNORMAL LOW (ref 3.5–5.1)
SODIUM: 132 mmol/L — AB (ref 135–145)
TOTAL PROTEIN: 6 g/dL — AB (ref 6.5–8.1)

## 2018-11-25 MED ORDER — GADOBUTROL 1 MMOL/ML IV SOLN
7.0000 mL | Freq: Once | INTRAVENOUS | Status: AC | PRN
  Administered 2018-11-25: 7 mL via INTRAVENOUS

## 2018-11-25 NOTE — ED Notes (Signed)
Patient transported to MRI 

## 2018-11-25 NOTE — Discharge Instructions (Addendum)
We saw in the ER for abdominal pain. MRI of your abdomen was completed, and unfortunately it is showing signs of pancreatic cancer.  We recommend that you contact your primary care doctor and inform them of your diagnosis to see if they have any other opinions.  Provided is the phone number for the Oncologist who can further help with the diagnosis and management.

## 2018-11-25 NOTE — ED Provider Notes (Signed)
[facility] DEPT Provider Note   CSN: [id number] Arrival date & time: 11/25/18  1344    History   Chief Complaint Chief Complaint  Patient presents with  . Abdominal Pain    HPI Tiffany [name] is a 85 y.o. female with medical history significant for hypertension, hyperlipidemia, aortic stenosis presenting to emergency department today with chief complaint of right lower quadrant pain x3 weeks.  Patient states she has had constant pain and describes it as a dull ache.  She rates the pain 4 out of 10 in severity but states she will have intermittent sharp pains occasionally.  Patient has associated anorexia.  She was seen by Dr. Harrington Challenger her PCP yesterday and had an ultrasound done that showed a cyst on her pancreas.  She was told she would need an MRI and should come to the emergency department to have the test done faster.  Patient also states she had recent problems with constipation however improved after taking MiraLAX.  Her most recent bowel movement was yesterday and it was normal for her. Denies fever, chills, chest pain, shortness of breath, weight loss, back pain. Denies abdominal surgical history. History  provided by patient and her daughter.      Past Medical History:  Diagnosis Date  . Aortic stenosis, mild    ECHO 09  . Bilateral cataracts   . DOE (dyspnea on exertion)   . Facial basal cell cancer    nose-/dr, Allyson Sabal  . Hearing loss in left ear    resolved after stapedectomy  . Heart murmur   . History of colon polyps   . Hyperlipidemia   . Hypertension   . Hypertrophic obstructive cardiomyopathy ([place])   . OA (osteoarthritis)   . Osteoporosis   . Retinal vein occlusion    Dr. Starling Manns, Left eye, receives injections  . Varicose veins   . Vitamin D deficiency     Patient Active Problem List   Diagnosis Date Noted  . Epidermal cyst of neck 04/06/2018  . Hypertrophic obstructive cardiomyopathy ([place]) 08/03/2016  . Essential  hypertension 08/03/2016  . DOE (dyspnea on exertion) 06/16/2016    Past Surgical History:  Procedure Laterality Date  . COLONOSCOPY  04/2000  . COLONOSCOPY W/ POLYPECTOMY  1988  . CYST EXCISION N/A 04/07/2018   Procedure: EXCISION EPIDERMAL CYST POSTERIOR NECK;  Surgeon: Armandina Gemma, MD;  Location: [facility];  Service: General;  Laterality: N/A;  . ELBOW SURGERY    . EXTERNAL EAR SURGERY Right 1974   Stapedectomy  . EYE SURGERY Bilateral    cataract  . SHOULDER SURGERY    . VAGINAL HYSTERECTOMY     without oopherectomy for birth control age 83     OB History   No obstetric history on file.      Home Medications    Prior to Admission medications   Medication Sig Start Date End Date Taking? Authorizing Provider  Cholecalciferol (VITAMIN D) 2000 units CAPS Take 1 capsule by mouth daily.   Yes [provider]  diltiazem (CARDIZEM CD) 180 MG 24 hr capsule Take 1 capsule (180 mg total) by mouth daily. 04/12/18  Yes Belva Crome, MD  lisinopril-hydrochlorothiazide (PRINZIDE,ZESTORETIC) 20-25 MG tablet Take 1 tablet by mouth daily.   Yes [provider]  Misc Natural Products (OSTEO BI-FLEX ADV DOUBLE ST PO) Take 1 tablet by mouth daily.   Yes [provider]  Multiple Vitamins-Minerals (PRESERVISION AREDS) CAPS Take 1 capsule by mouth daily.  Yes [provider]    Family History Family History  Problem Relation Age of Onset  . Melanoma Mother   . Cerebral aneurysm Mother   . Hypertension Mother   . CAD Father   . Breast cancer Sister   . Colon cancer Brother   . Hypertension Brother   . Hypertension Son   . Hypertension Son     Social History Social History   Tobacco Use  . Smoking status: Never Smoker  . Smokeless tobacco: Never Used  Substance Use Topics  . Alcohol use: Yes    Comment: occ  . Drug use: No     Allergies   Other   Review of Systems Review of Systems  Constitutional: Positive for  appetite change.  Gastrointestinal: Positive for abdominal pain. Negative for constipation, diarrhea, nausea and vomiting.  All other systems reviewed and are negative.    Physical Exam Updated Vital Signs BP 140/74 (BP Location: Right Arm)   Pulse 84   Temp 97.6 F (36.4 C) (Oral)   Resp 18   Ht 5' 3.5" (1.613 m)   Wt 67.1 kg   SpO2 95%   BMI 25.81 kg/m   Physical Exam Vitals signs and nursing note reviewed.  Constitutional:      Appearance: She is well-developed. She is not toxic-appearing.  HENT:     Head: Normocephalic and atraumatic.  Eyes:     General: No scleral icterus.       Right eye: No discharge.        Left eye: No discharge.     Conjunctiva/sclera: Conjunctivae normal.  Neck:     Musculoskeletal: Normal range of motion.  Cardiovascular:     Rate and Rhythm: Normal rate and regular rhythm.     Pulses: Normal pulses.          Radial pulses are 2+ on the right side and 2+ on the left side.     Heart sounds: Normal heart sounds.  Pulmonary:     Effort: Pulmonary effort is normal.     Breath sounds: Normal breath sounds.  Abdominal:     General: Bowel sounds are normal. There is no distension.     Palpations: Abdomen is soft.     Tenderness: There is no abdominal tenderness.  Musculoskeletal: Normal range of motion.     Right lower leg: No edema.     Left lower leg: No edema.  Skin:    General: Skin is warm and dry.     Capillary Refill: Capillary refill takes less than 2 seconds.  Neurological:     Mental Status: She is oriented to person, place, and time.     Comments: Fluent speech, no facial droop.  Psychiatric:        Behavior: Behavior normal.      ED Treatments / Results  Labs (all labs ordered are listed, but only abnormal results are displayed) Labs Reviewed  COMPREHENSIVE METABOLIC PANEL - Abnormal; Notable for the following components:      Result Value   Sodium 132 (*)    Potassium 3.4 (*)    Glucose, Bld 101 (*)    Total  Protein 6.0 (*)    All other components within normal limits  URINALYSIS, ROUTINE W REFLEX MICROSCOPIC - Abnormal; Notable for the following components:   Ketones, ur 5 (*)    Leukocytes,Ua MODERATE (*)    Bacteria, UA RARE (*)    All other components within normal limits  CBC WITH DIFFERENTIAL/PLATELET  LIPASE, BLOOD    EKG None  Radiology US Abdomen Complete  Result Date: 11/25/2018 CLINICAL DATA:  RIGHT UPPER QUADRANT pain for 3 weeks. EXAM: ABDOMEN ULTRASOUND COMPLETE COMPARISON:  None. FINDINGS: Gallbladder: Gallbladder has a normal appearance. Gallbladder wall is 1.4 millimeters, within normal limits. No stones or pericholecystic fluid. No sonographic Murphy's sign. Common bile duct: Diameter: 2.5 millimeters Liver: Normal echotexture. A small cyst is identified in the caudate lobe measuring 0.7 x 0.6 x 0.7 centimeters. Portal vein is patent on color Doppler imaging with normal direction of blood flow towards the liver. IVC: No abnormality visualized. Pancreas: A 1.0 x 1.0 x 0.9 centimeters cyst is identified in the pancreatic head region. Spleen: Size and appearance within normal limits. Right Kidney: Length: 11.3 centimeters. No mass or hydronephrosis visualized. Left Kidney: Length: 10.3 centimeters. Possible parapelvic cysts versus focal dilatation LOWER pole collecting system. No solid mass. Abdominal aorta: 2.1 centimeters. There is atherosclerosis of the abdominal aorta. Other findings: Superior to the pancreas, a probable lymph node is 5.5 millimeters. IMPRESSION: 1. No evidence for acute cholecystitis. 2. Caudate lobe cyst. 3. 1.0 centimeter pancreatic head cyst. No other pancreatic lesions are identified. 5.5 millimeter lymph node adjacent to the pancreatic head. Recommend further evaluation with MR/MRCP. If patient is not a candidate for MRI, recommend CT of the abdomen and pelvis with contrast for evaluation of the pancreas. 4. These results will be called to the ordering clinician  or representative by the Radiologist Assistant, and communication documented in the PACS or zVision Dashboard. Electronically Signed   By: Nolon Nations M.D.   On: 11/25/2018 08:36   Mr 3d Recon At Scanner  Result Date: 11/25/2018 CLINICAL DATA:  Right upper quadrant abdominal pain. Small pancreatic head cystic lesion on ultrasound. EXAM: MRI ABDOMEN WITHOUT AND WITH CONTRAST (INCLUDING MRCP) TECHNIQUE: Multiplanar multisequence MR imaging of the abdomen was performed both before and after the administration of intravenous contrast. Heavily T2-weighted images of the biliary and pancreatic ducts were obtained, and three-dimensional MRCP images were rendered by post processing. CONTRAST:  7 cc Gadavist IV. COMPARISON:  11/24/2018 abdominal sonogram. FINDINGS: Lower chest: Multiple lung masses and pulmonary nodules are scattered throughout both lung bases measuring up to 3.5 cm at the left lung base (series 8/image 12) and 2.6 cm at the posterior right lung base (series 8/image 7). Hepatobiliary: Normal liver size and configuration. No hepatic steatosis. Simple 0.9 cm caudate lobe cyst. No suspicious liver masses. Normal gallbladder with no cholelithiasis. No biliary ductal dilatation. Common bile duct diameter 3 mm. No choledocholithiasis. Pancreas: There is an irregular poorly marginated hypoenhancing 3.2 x 2.2 cm posterior pancreatic body mass (series 1104/image 54), which encases the celiac artery at its trifurcation. There are separate 1.4 cm pancreatic neck (series 400/image 63) and 1.2 cm posterior pancreatic head (series 400/image 69) cystic masses that communicate with the main pancreatic duct and demonstrate no wall thickening, enhancement or thick internal septations. Main pancreatic duct diameter is 3 mm in the pancreatic head and neck. Abrupt cut off of main pancreatic duct at the level of the pancreatic body mass. No pancreas divisum. Spleen: Normal size. No mass. Adrenals/Urinary Tract: Right  adrenal 1.9 cm nodule demonstrates prominent loss of signal intensity on out of phase chemical shift imaging compatible with a benign adenoma. No left adrenal nodules. No hydronephrosis. Simple parapelvic renal cysts in the left kidney. No suspicious renal masses. Stomach/Bowel: Normal non-distended stomach. Visualized small and large bowel is normal caliber, with no bowel wall thickening.  Vascular/Lymphatic: Atherosclerotic nonaneurysmal abdominal aorta. Patent hepatic, portal and renal veins. Splenic vein is occluded at the level of the pancreatic body mass. Patent SMV. No pathologically enlarged lymph nodes in the abdomen. Other: Trace upper abdominal ascites. No focal fluid collections. Widespread soft tissue implants throughout omentum measuring up to 2.3 cm in the left omentum (series 1103/image 80). Numerous small soft tissue implants throughout perihepatic space 5, for example a 1.1 cm right posterior perihepatic nodule (series 1103/image 51). Musculoskeletal: No aggressive appearing focal osseous lesions. IMPRESSION: 1. Locally advanced/inoperable primary pancreatic adenocarcinoma in the pancreatic body, encasing the celiac artery. Occluded splenic vein. 2. Extensive peritoneal carcinomatosis with soft tissue metastases throughout omentum and perihepatic space. Trace malignant ascites. 3. Bulky pulmonary metastases throughout both lung bases. 4. No biliary ductal dilatation. 5. Right adrenal adenoma. 6.  Aortic Atherosclerosis (ICD10-I70.0). Electronically Signed   By: Ilona Sorrel M.D.   On: 11/25/2018 19:49   Mr Abdomen Mrcp Moise Boring Contast  Result Date: 11/25/2018 CLINICAL DATA:  Right upper quadrant abdominal pain. Small pancreatic head cystic lesion on ultrasound. EXAM: MRI ABDOMEN WITHOUT AND WITH CONTRAST (INCLUDING MRCP) TECHNIQUE: Multiplanar multisequence MR imaging of the abdomen was performed both before and after the administration of intravenous contrast. Heavily T2-weighted images of the  biliary and pancreatic ducts were obtained, and three-dimensional MRCP images were rendered by post processing. CONTRAST:  7 cc Gadavist IV. COMPARISON:  11/24/2018 abdominal sonogram. FINDINGS: Lower chest: Multiple lung masses and pulmonary nodules are scattered throughout both lung bases measuring up to 3.5 cm at the left lung base (series 8/image 12) and 2.6 cm at the posterior right lung base (series 8/image 7). Hepatobiliary: Normal liver size and configuration. No hepatic steatosis. Simple 0.9 cm caudate lobe cyst. No suspicious liver masses. Normal gallbladder with no cholelithiasis. No biliary ductal dilatation. Common bile duct diameter 3 mm. No choledocholithiasis. Pancreas: There is an irregular poorly marginated hypoenhancing 3.2 x 2.2 cm posterior pancreatic body mass (series 1104/image 54), which encases the celiac artery at its trifurcation. There are separate 1.4 cm pancreatic neck (series 400/image 63) and 1.2 cm posterior pancreatic head (series 400/image 69) cystic masses that communicate with the main pancreatic duct and demonstrate no wall thickening, enhancement or thick internal septations. Main pancreatic duct diameter is 3 mm in the pancreatic head and neck. Abrupt cut off of main pancreatic duct at the level of the pancreatic body mass. No pancreas divisum. Spleen: Normal size. No mass. Adrenals/Urinary Tract: Right adrenal 1.9 cm nodule demonstrates prominent loss of signal intensity on out of phase chemical shift imaging compatible with a benign adenoma. No left adrenal nodules. No hydronephrosis. Simple parapelvic renal cysts in the left kidney. No suspicious renal masses. Stomach/Bowel: Normal non-distended stomach. Visualized small and large bowel is normal caliber, with no bowel wall thickening. Vascular/Lymphatic: Atherosclerotic nonaneurysmal abdominal aorta. Patent hepatic, portal and renal veins. Splenic vein is occluded at the level of the pancreatic body mass. Patent SMV. No  pathologically enlarged lymph nodes in the abdomen. Other: Trace upper abdominal ascites. No focal fluid collections. Widespread soft tissue implants throughout omentum measuring up to 2.3 cm in the left omentum (series 1103/image 80). Numerous small soft tissue implants throughout perihepatic space 5, for example a 1.1 cm right posterior perihepatic nodule (series 1103/image 51). Musculoskeletal: No aggressive appearing focal osseous lesions. IMPRESSION: 1. Locally advanced/inoperable primary pancreatic adenocarcinoma in the pancreatic body, encasing the celiac artery. Occluded splenic vein. 2. Extensive peritoneal carcinomatosis with soft tissue metastases throughout omentum and  perihepatic space. Trace malignant ascites. 3. Bulky pulmonary metastases throughout both lung bases. 4. No biliary ductal dilatation. 5. Right adrenal adenoma. 6.  Aortic Atherosclerosis (ICD10-I70.0). Electronically Signed   By: Ilona Sorrel M.D.   On: 11/25/2018 19:49      Procedures Procedures (including critical care time)  Medications Ordered in ED Medications  gadobutrol (GADAVIST) 1 MMOL/ML injection 7 mL (7 mLs Intravenous Contrast Given 11/25/18 1911)     Initial Impression / Assessment and Plan / ED Course  I have reviewed the triage vital signs and the nursing notes.  Pertinent labs & imaging results that were available during my care of the patient were reviewed by me and considered in my medical decision making (see chart for details).    Pt is afebrile, in no acute distress.  She is presenting with 3 weeks of right lower quadrant abdominal pain with anorexia. On exam abdomen is non tender.   Patient had an abdominal ultrasound yesterday which showed a 1 x 1 x 1 cm cyst on head of the pancreas.  PCP recommended pt come to ED for MR abdomen MRCP for further evaluation.  Patient's labs including CMP, CBC are unremarkable.  UA with moderate leukocytes otherwise does not show signs of infection and patient  denies any urinary symptoms.  Unfortunately MR shows signs of pancreatic cancer including locally advanced/inoperable primary pancreatic adenocarcinoma in the body of the pancreas with peritoneal carcinomatosis and metastes to bilateral lung bases.  Patient is hemodynamically stable, in NAD, and able to ambulate in the ED. Patient is comfortable with above plan and is stable for discharge at this time. All questions were answered prior to disposition. Return precautions for returning to the ED were discussed.  Recommend oncology follow-up with Dr. Irene Limbo for further evaluation. The patient was discussed with and seen by Dr. Kathrynn Humble who agrees with the treatment plan.   This note was prepared with assistance of Systems analyst. Occasional wrong-word or sound-a-like substitutions may have occurred due to the inherent limitations of voice recognition software.       Final Clinical Impressions(s) / ED Diagnoses   Final diagnoses:  Malignant neoplasm of pancreas, unspecified location of malignancy [facility])    ED Discharge Orders    None       Flint Melter 11/25/18 2222    Varney Biles, MD 11/26/18 [phone]

## 2018-11-25 NOTE — ED Triage Notes (Signed)
Pt states she was sent in by Dr. Harrington Challenger from [facility] after and [place] on her abdomen revealed she had a cyst on her pancreas. She states she was told to come here so the test would be done quicker. The [place] was done after 3 weeks of abdominal pain. Alert and oriented.

## 2018-11-29 ENCOUNTER — Other Ambulatory Visit: Payer: Self-pay | Admitting: Family Medicine

## 2018-11-29 DIAGNOSIS — K5900 Constipation, unspecified: Secondary | ICD-10-CM | POA: Diagnosis not present

## 2018-11-29 DIAGNOSIS — C259 Malignant neoplasm of pancreas, unspecified: Secondary | ICD-10-CM | POA: Diagnosis not present

## 2018-11-29 DIAGNOSIS — C7800 Secondary malignant neoplasm of unspecified lung: Secondary | ICD-10-CM | POA: Diagnosis not present

## 2018-11-29 DIAGNOSIS — R935 Abnormal findings on diagnostic imaging of other abdominal regions, including retroperitoneum: Secondary | ICD-10-CM

## 2018-11-30 ENCOUNTER — Telehealth: Payer: Self-pay | Admitting: Oncology

## 2018-11-30 NOTE — Telephone Encounter (Signed)
Tiffany [name] cld me on 3/9 needing to schedule an appt with an oncologist. Per pt she was seen in the ED. I coordinated an appt with our nurse navigator for the pt to see an oncologist for 3/13 at 2pm to see Dr. Benay Spice. When I cld her she seemed confused as to why was I calling her and stated that she wouldn't need the appt on Friday bc her PCP, [name] was referring her.   3/11-I spoke to [facility] to see if she can call Tiffany [name] to reiterate the appt date and time for thise Friday.

## 2018-12-01 ENCOUNTER — Other Ambulatory Visit: Payer: Self-pay

## 2018-12-01 ENCOUNTER — Inpatient Hospital Stay: Payer: Medicare Other | Attending: Oncology | Admitting: Oncology

## 2018-12-01 ENCOUNTER — Encounter: Payer: Self-pay | Admitting: Oncology

## 2018-12-01 DIAGNOSIS — I421 Obstructive hypertrophic cardiomyopathy: Secondary | ICD-10-CM | POA: Insufficient documentation

## 2018-12-01 DIAGNOSIS — Z800 Family history of malignant neoplasm of digestive organs: Secondary | ICD-10-CM | POA: Insufficient documentation

## 2018-12-01 DIAGNOSIS — C258 Malignant neoplasm of overlapping sites of pancreas: Secondary | ICD-10-CM | POA: Insufficient documentation

## 2018-12-01 DIAGNOSIS — K8689 Other specified diseases of pancreas: Secondary | ICD-10-CM

## 2018-12-01 DIAGNOSIS — Z808 Family history of malignant neoplasm of other organs or systems: Secondary | ICD-10-CM | POA: Diagnosis not present

## 2018-12-01 DIAGNOSIS — Z803 Family history of malignant neoplasm of breast: Secondary | ICD-10-CM | POA: Insufficient documentation

## 2018-12-01 DIAGNOSIS — C786 Secondary malignant neoplasm of retroperitoneum and peritoneum: Secondary | ICD-10-CM | POA: Insufficient documentation

## 2018-12-01 DIAGNOSIS — Z79899 Other long term (current) drug therapy: Secondary | ICD-10-CM | POA: Diagnosis not present

## 2018-12-01 DIAGNOSIS — I10 Essential (primary) hypertension: Secondary | ICD-10-CM | POA: Diagnosis not present

## 2018-12-01 DIAGNOSIS — C7800 Secondary malignant neoplasm of unspecified lung: Secondary | ICD-10-CM | POA: Insufficient documentation

## 2018-12-01 DIAGNOSIS — C251 Malignant neoplasm of body of pancreas: Secondary | ICD-10-CM

## 2018-12-01 DIAGNOSIS — R918 Other nonspecific abnormal finding of lung field: Secondary | ICD-10-CM | POA: Diagnosis not present

## 2018-12-01 NOTE — Progress Notes (Addendum)
New Hematology/Oncology Consult   Requesting MD: Dr. Harrington Challenger  [phone]      Reason for Consult: Pancreas mass, lung nodules, peritoneal nodules  HPI: Tiffany [name] is a 85 year old woman who recently presented with a 3 to 4-week history of abdominal pain and decreased energy.  She was referred for an abdominal ultrasound on 11/25/2018 with findings of a 1.0 cm pancreatic head cyst and a 5.5 mm lymph node adjacent to the pancreatic head.  She presented to the emergency department on 11/25/2018 for additional evaluation.  MRI abdomen showed an irregular poorly marginated hypoenhancing 3.2 x 2.2 cm posterior pancreatic body mass encasing the celiac artery; separate 1.4 cm pancreatic neck and 1.2 cm posterior pancreatic head cystic masses; multiple lung masses/pulmonary nodules scattered throughout both lung bases measuring up to 3.5 cm; widespread soft tissue implants about the omentum measuring up to 2.3 cm and numerous small soft tissue implants about the perihepatic space.     Past Medical History:  Diagnosis Date  . Aortic stenosis, mild    ECHO 09  . Bilateral cataracts   . DOE (dyspnea on exertion)   . Facial basal cell cancer    nose-/dr, Allyson Sabal  . Hearing loss in left ear    resolved after stapedectomy  . Heart murmur   . History of colon polyps   . Hyperlipidemia   . Hypertension   . Hypertrophic obstructive cardiomyopathy ([place])   . OA (osteoarthritis)   . Osteoporosis   . Retinal vein occlusion    Dr. Starling Manns, Left eye, receives injections  . Varicose veins   . Vitamin D deficiency   :   Past Surgical History:  Procedure Laterality Date  . COLONOSCOPY  04/2000  . COLONOSCOPY W/ POLYPECTOMY  1988  . CYST EXCISION N/A 04/07/2018   Procedure: EXCISION EPIDERMAL CYST POSTERIOR NECK;  Surgeon: Armandina Gemma, MD;  Location: [facility];  Service: General;  Laterality: N/A;  . ELBOW SURGERY    . EXTERNAL EAR SURGERY Right 1974   Stapedectomy  . EYE SURGERY  Bilateral    cataract  . SHOULDER SURGERY    . VAGINAL HYSTERECTOMY     without oopherectomy for birth control age 17  :   Current Outpatient Medications:  .  Cholecalciferol (VITAMIN D) 2000 units CAPS, Take 1 capsule by mouth daily., Disp: , Rfl:  .  diltiazem (CARDIZEM CD) 180 MG 24 hr capsule, Take 1 capsule (180 mg total) by mouth daily., Disp: 90 capsule, Rfl: 3 .  lisinopril-hydrochlorothiazide (PRINZIDE,ZESTORETIC) 20-25 MG tablet, Take 1 tablet by mouth daily., Disp: , Rfl:  .  Misc Natural Products (OSTEO BI-FLEX ADV DOUBLE ST PO), Take 1 tablet by mouth daily., Disp: , Rfl:  .  Multiple Vitamins-Minerals (PRESERVISION AREDS) CAPS, Take 1 capsule by mouth daily., Disp: , Rfl: :  :   Allergies  Allergen Reactions  . Other     Silk fabric-caused Rash  :  FH:  Mother with melanoma. Brother with colon cancer.  SOCIAL HISTORY: She lives in [place] by herself.  She has 5 children.  She is retired. No tobacco use.  Occasional EtOH intake. She has never had a blood transfusion.  No risk factors for hepatitis.  Review of Systems: She reports appetite has been diminished; estimates 14 pounds of weight loss.  She denies bleeding.  No fevers or sweats.  Energy level decreased.  She was very active until recently.  She denies focal weakness.  No unusual headaches or vision change.  No recent infections.  She denies dysphagia.  No nausea or vomiting.  Recent constipation.  She has dyspnea on exertion.  She has a "nagging cough".  No chest pain.  No urinary symptoms.  Physical Exam:  There were no vitals taken for this visit.  HEENT: No thrush or ulcers.  No scleral icterus. Lungs: End inspiratory rales at both lung bases.  No respiratory distress. Cardiac: Regular rate and rhythm. Abdomen: No hepatosplenomegaly.  No mass.  No ascites.  Vascular: No leg edema.  Superficial varicosities at the lower legs bilaterally. Lymph nodes: No palpable cervical, supraclavicular, axillary or  inguinal lymph nodes. Neurologic: Alert and oriented. Skin: No rash. Musculoskeletal: Bilateral DIP/PIP joints with arthritic changes.  LABS:  None  RADIOLOGY:  US Abdomen Complete  Result Date: 11/25/2018 CLINICAL DATA:  RIGHT UPPER QUADRANT pain for 3 weeks. EXAM: ABDOMEN ULTRASOUND COMPLETE COMPARISON:  None. FINDINGS: Gallbladder: Gallbladder has a normal appearance. Gallbladder wall is 1.4 millimeters, within normal limits. No stones or pericholecystic fluid. No sonographic Murphy's sign. Common bile duct: Diameter: 2.5 millimeters Liver: Normal echotexture. A small cyst is identified in the caudate lobe measuring 0.7 x 0.6 x 0.7 centimeters. Portal vein is patent on color Doppler imaging with normal direction of blood flow towards the liver. IVC: No abnormality visualized. Pancreas: A 1.0 x 1.0 x 0.9 centimeters cyst is identified in the pancreatic head region. Spleen: Size and appearance within normal limits. Right Kidney: Length: 11.3 centimeters. No mass or hydronephrosis visualized. Left Kidney: Length: 10.3 centimeters. Possible parapelvic cysts versus focal dilatation LOWER pole collecting system. No solid mass. Abdominal aorta: 2.1 centimeters. There is atherosclerosis of the abdominal aorta. Other findings: Superior to the pancreas, a probable lymph node is 5.5 millimeters. IMPRESSION: 1. No evidence for acute cholecystitis. 2. Caudate lobe cyst. 3. 1.0 centimeter pancreatic head cyst. No other pancreatic lesions are identified. 5.5 millimeter lymph node adjacent to the pancreatic head. Recommend further evaluation with MR/MRCP. If patient is not a candidate for MRI, recommend CT of the abdomen and pelvis with contrast for evaluation of the pancreas. 4. These results will be called to the ordering clinician or representative by the Radiologist Assistant, and communication documented in the PACS or zVision Dashboard. Electronically Signed   By: Nolon Nations M.D.   On: 11/25/2018 08:36    Mr 3d Recon At Scanner  Result Date: 11/25/2018 CLINICAL DATA:  Right upper quadrant abdominal pain. Small pancreatic head cystic lesion on ultrasound. EXAM: MRI ABDOMEN WITHOUT AND WITH CONTRAST (INCLUDING MRCP) TECHNIQUE: Multiplanar multisequence MR imaging of the abdomen was performed both before and after the administration of intravenous contrast. Heavily T2-weighted images of the biliary and pancreatic ducts were obtained, and three-dimensional MRCP images were rendered by post processing. CONTRAST:  7 cc Gadavist IV. COMPARISON:  11/24/2018 abdominal sonogram. FINDINGS: Lower chest: Multiple lung masses and pulmonary nodules are scattered throughout both lung bases measuring up to 3.5 cm at the left lung base (series 8/image 12) and 2.6 cm at the posterior right lung base (series 8/image 7). Hepatobiliary: Normal liver size and configuration. No hepatic steatosis. Simple 0.9 cm caudate lobe cyst. No suspicious liver masses. Normal gallbladder with no cholelithiasis. No biliary ductal dilatation. Common bile duct diameter 3 mm. No choledocholithiasis. Pancreas: There is an irregular poorly marginated hypoenhancing 3.2 x 2.2 cm posterior pancreatic body mass (series 1104/image 54), which encases the celiac artery at its trifurcation. There are separate 1.4 cm pancreatic neck (series 400/image 63) and 1.2 cm posterior pancreatic  head (series 400/image 69) cystic masses that communicate with the main pancreatic duct and demonstrate no wall thickening, enhancement or thick internal septations. Main pancreatic duct diameter is 3 mm in the pancreatic head and neck. Abrupt cut off of main pancreatic duct at the level of the pancreatic body mass. No pancreas divisum. Spleen: Normal size. No mass. Adrenals/Urinary Tract: Right adrenal 1.9 cm nodule demonstrates prominent loss of signal intensity on out of phase chemical shift imaging compatible with a benign adenoma. No left adrenal nodules. No hydronephrosis.  Simple parapelvic renal cysts in the left kidney. No suspicious renal masses. Stomach/Bowel: Normal non-distended stomach. Visualized small and large bowel is normal caliber, with no bowel wall thickening. Vascular/Lymphatic: Atherosclerotic nonaneurysmal abdominal aorta. Patent hepatic, portal and renal veins. Splenic vein is occluded at the level of the pancreatic body mass. Patent SMV. No pathologically enlarged lymph nodes in the abdomen. Other: Trace upper abdominal ascites. No focal fluid collections. Widespread soft tissue implants throughout omentum measuring up to 2.3 cm in the left omentum (series 1103/image 80). Numerous small soft tissue implants throughout perihepatic space 5, for example a 1.1 cm right posterior perihepatic nodule (series 1103/image 51). Musculoskeletal: No aggressive appearing focal osseous lesions. IMPRESSION: 1. Locally advanced/inoperable primary pancreatic adenocarcinoma in the pancreatic body, encasing the celiac artery. Occluded splenic vein. 2. Extensive peritoneal carcinomatosis with soft tissue metastases throughout omentum and perihepatic space. Trace malignant ascites. 3. Bulky pulmonary metastases throughout both lung bases. 4. No biliary ductal dilatation. 5. Right adrenal adenoma. 6.  Aortic Atherosclerosis (ICD10-I70.0). Electronically Signed   By: Ilona Sorrel M.D.   On: 11/25/2018 19:49   Mr Abdomen Mrcp Moise Boring Contast  Result Date: 11/25/2018 CLINICAL DATA:  Right upper quadrant abdominal pain. Small pancreatic head cystic lesion on ultrasound. EXAM: MRI ABDOMEN WITHOUT AND WITH CONTRAST (INCLUDING MRCP) TECHNIQUE: Multiplanar multisequence MR imaging of the abdomen was performed both before and after the administration of intravenous contrast. Heavily T2-weighted images of the biliary and pancreatic ducts were obtained, and three-dimensional MRCP images were rendered by post processing. CONTRAST:  7 cc Gadavist IV. COMPARISON:  11/24/2018 abdominal sonogram.  FINDINGS: Lower chest: Multiple lung masses and pulmonary nodules are scattered throughout both lung bases measuring up to 3.5 cm at the left lung base (series 8/image 12) and 2.6 cm at the posterior right lung base (series 8/image 7). Hepatobiliary: Normal liver size and configuration. No hepatic steatosis. Simple 0.9 cm caudate lobe cyst. No suspicious liver masses. Normal gallbladder with no cholelithiasis. No biliary ductal dilatation. Common bile duct diameter 3 mm. No choledocholithiasis. Pancreas: There is an irregular poorly marginated hypoenhancing 3.2 x 2.2 cm posterior pancreatic body mass (series 1104/image 54), which encases the celiac artery at its trifurcation. There are separate 1.4 cm pancreatic neck (series 400/image 63) and 1.2 cm posterior pancreatic head (series 400/image 69) cystic masses that communicate with the main pancreatic duct and demonstrate no wall thickening, enhancement or thick internal septations. Main pancreatic duct diameter is 3 mm in the pancreatic head and neck. Abrupt cut off of main pancreatic duct at the level of the pancreatic body mass. No pancreas divisum. Spleen: Normal size. No mass. Adrenals/Urinary Tract: Right adrenal 1.9 cm nodule demonstrates prominent loss of signal intensity on out of phase chemical shift imaging compatible with a benign adenoma. No left adrenal nodules. No hydronephrosis. Simple parapelvic renal cysts in the left kidney. No suspicious renal masses. Stomach/Bowel: Normal non-distended stomach. Visualized small and large bowel is normal caliber, with no bowel  wall thickening. Vascular/Lymphatic: Atherosclerotic nonaneurysmal abdominal aorta. Patent hepatic, portal and renal veins. Splenic vein is occluded at the level of the pancreatic body mass. Patent SMV. No pathologically enlarged lymph nodes in the abdomen. Other: Trace upper abdominal ascites. No focal fluid collections. Widespread soft tissue implants throughout omentum measuring up to  2.3 cm in the left omentum (series 1103/image 80). Numerous small soft tissue implants throughout perihepatic space 5, for example a 1.1 cm right posterior perihepatic nodule (series 1103/image 51). Musculoskeletal: No aggressive appearing focal osseous lesions. IMPRESSION: 1. Locally advanced/inoperable primary pancreatic adenocarcinoma in the pancreatic body, encasing the celiac artery. Occluded splenic vein. 2. Extensive peritoneal carcinomatosis with soft tissue metastases throughout omentum and perihepatic space. Trace malignant ascites. 3. Bulky pulmonary metastases throughout both lung bases. 4. No biliary ductal dilatation. 5. Right adrenal adenoma. 6.  Aortic Atherosclerosis (ICD10-I70.0). Electronically Signed   By: Ilona Sorrel M.D.   On: 11/25/2018 19:49    Assessment and Plan:   1. Pancreas mass/lung nodules/peritoneal nodules  Abdominal ultrasound 11/25/2018-1.0 cm pancreatic head cyst and a 5.5 mm lymph node adjacent to the pancreatic head.    MRI abdomen 11/25/2018-irregular poorly marginated hypoenhancing 3.2 x 2.2 cm posterior pancreatic body mass encasing the celiac artery; separate 1.4 cm pancreatic neck and 1.2 cm posterior pancreatic head cystic masses; multiple lung masses/pulmonary nodules scattered throughout both lung bases measuring up to 3.5 cm; extensive peritoneal carcinomatosis with soft tissue metastases throughout the omentum and perihepatic space; trace ascites. 2. Abdominal pain secondary to #1 3. Hypertension 4. Hypertrophic obstructive cardiomyopathy  Tiffany [name] is a 85 year old woman who recently presented with abdominal pain.  She appears to have pancreas cancer with lung metastases and carcinomatosis.  Dr. Benay Spice reviewed the likely diagnosis with Tiffany [name] and her family at today's visit.  They understand no therapy will be curative.  Dr. Benay Spice discussed options to include supportive care versus a trial of systemic therapy.  Tiffany [name] is interested in pursuing  systemic therapy and thus would like to proceed with additional evaluation including a biopsy to confirm the diagnosis.  We are referring her for staging CT scans, biopsy of a peritoneal nodule.  She will have baseline labs to include a CBC, chemistry panel and CA-19-9.  We made a referral to the genetics counselor.  She is symptomatic with abdominal pain.  She declines a prescription for pain medication.  She will continue Tylenol as needed.  She will return for a follow-up visit on 12/09/2018 to review the CT and biopsy results.  She will contact the office in the interim with any problems.  Patient seen with Dr. Benay Spice.  MRI images reviewed on the computer with Tiffany [name] and her daughter.  45 minutes were spent face-to-face at today's visit with the majority of that time involved in counseling/coordination of care.   Ned Card, NP 12/01/2018, 5:01 PM   This was a shared visit with Ned Card.  Tiffany [name] was interviewed and examined.  We reviewed MR images with her.  She appears to have metastatic pancreas cancer.  The clinical presentation and pattern of tumor spread are consistent with metastatic adenocarcinoma the pancreas.  We discussed the diagnosis and treatment options with Tiffany [name] and her daughter.  She indicated she would like to consider a trial of systemic therapy as opposed to comfort care.  She will be referred for further staging and a diagnostic biopsy.  She will return for an office visit and further discussion after the biopsy.  Julieanne Manson, MD

## 2018-12-02 ENCOUNTER — Ambulatory Visit: Payer: Medicare Other | Admitting: Oncology

## 2018-12-02 ENCOUNTER — Other Ambulatory Visit: Payer: Self-pay | Admitting: Nurse Practitioner

## 2018-12-02 ENCOUNTER — Telehealth: Payer: Self-pay | Admitting: *Deleted

## 2018-12-02 DIAGNOSIS — K8689 Other specified diseases of pancreas: Secondary | ICD-10-CM

## 2018-12-02 MED ORDER — TRAMADOL HCL 50 MG PO TABS: 50.0000 mg | ORAL_TABLET | Freq: Two times a day (BID) | ORAL | 0 refills | Status: DC | PRN

## 2018-12-02 NOTE — Telephone Encounter (Signed)
Patient left VM that she has decided to accept MD offer for pain medication. Please send to [facility] on [address].

## 2018-12-05 ENCOUNTER — Other Ambulatory Visit: Payer: Self-pay | Admitting: *Deleted

## 2018-12-05 ENCOUNTER — Inpatient Hospital Stay: Payer: Medicare Other

## 2018-12-05 ENCOUNTER — Inpatient Hospital Stay (HOSPITAL_BASED_OUTPATIENT_CLINIC_OR_DEPARTMENT_OTHER): Payer: Medicare Other | Admitting: Licensed Clinical Social Worker

## 2018-12-05 ENCOUNTER — Encounter: Payer: Self-pay | Admitting: Licensed Clinical Social Worker

## 2018-12-05 ENCOUNTER — Other Ambulatory Visit: Payer: Self-pay

## 2018-12-05 DIAGNOSIS — Z808 Family history of malignant neoplasm of other organs or systems: Secondary | ICD-10-CM

## 2018-12-05 DIAGNOSIS — C251 Malignant neoplasm of body of pancreas: Secondary | ICD-10-CM

## 2018-12-05 DIAGNOSIS — K8689 Other specified diseases of pancreas: Secondary | ICD-10-CM

## 2018-12-05 DIAGNOSIS — C258 Malignant neoplasm of overlapping sites of pancreas: Secondary | ICD-10-CM | POA: Diagnosis not present

## 2018-12-05 DIAGNOSIS — I421 Obstructive hypertrophic cardiomyopathy: Secondary | ICD-10-CM | POA: Diagnosis not present

## 2018-12-05 DIAGNOSIS — I10 Essential (primary) hypertension: Secondary | ICD-10-CM | POA: Diagnosis not present

## 2018-12-05 DIAGNOSIS — Z79899 Other long term (current) drug therapy: Secondary | ICD-10-CM | POA: Diagnosis not present

## 2018-12-05 DIAGNOSIS — Z800 Family history of malignant neoplasm of digestive organs: Secondary | ICD-10-CM | POA: Diagnosis not present

## 2018-12-05 DIAGNOSIS — C786 Secondary malignant neoplasm of retroperitoneum and peritoneum: Secondary | ICD-10-CM | POA: Diagnosis not present

## 2018-12-05 DIAGNOSIS — Z803 Family history of malignant neoplasm of breast: Secondary | ICD-10-CM | POA: Diagnosis not present

## 2018-12-05 DIAGNOSIS — C7800 Secondary malignant neoplasm of unspecified lung: Secondary | ICD-10-CM | POA: Diagnosis not present

## 2018-12-05 DIAGNOSIS — Z1379 Encounter for other screening for genetic and chromosomal anomalies: Secondary | ICD-10-CM

## 2018-12-05 LAB — CMP (CANCER CENTER ONLY)
ALBUMIN: 3.6 g/dL (ref 3.5–5.0)
ALT: 65 U/L — ABNORMAL HIGH (ref 0–44)
AST: 55 U/L — ABNORMAL HIGH (ref 15–41)
Alkaline Phosphatase: 69 U/L (ref 38–126)
Anion gap: 10 (ref 5–15)
BUN: 11 mg/dL (ref 8–23)
CO2: 27 mmol/L (ref 22–32)
CREATININE: 0.88 mg/dL (ref 0.44–1.00)
Calcium: 10.6 mg/dL — ABNORMAL HIGH (ref 8.9–10.3)
Chloride: 94 mmol/L — ABNORMAL LOW (ref 98–111)
GFR, Est AFR Am: >60 mL/min (ref >60)
GFR, Estimated: 58 mL/min — ABNORMAL LOW (ref >60)
Glucose, Bld: 110 mg/dL — ABNORMAL HIGH (ref 70–99)
Potassium: 4.2 mmol/L (ref 3.5–5.1)
Sodium: 131 mmol/L — ABNORMAL LOW (ref 135–145)
TOTAL PROTEIN: 6.3 g/dL — AB (ref 6.5–8.1)
Total Bilirubin: 0.7 mg/dL (ref 0.3–1.2)

## 2018-12-05 LAB — CBC WITH DIFFERENTIAL (CANCER CENTER ONLY)
Abs Immature Granulocytes: 0.01 10*3/uL (ref 0.00–0.07)
BASOS ABS: 0 10*3/uL (ref 0.0–0.1)
Basophils Relative: 1 %
Eosinophils Absolute: 0.2 10*3/uL (ref 0.0–0.5)
Eosinophils Relative: 3 %
HEMATOCRIT: 38.3 % (ref 36.0–46.0)
Hemoglobin: 13.1 g/dL (ref 12.0–15.0)
Immature Granulocytes: 0 %
Lymphocytes Relative: 10 %
Lymphs Abs: 0.6 10*3/uL — ABNORMAL LOW (ref 0.7–4.0)
MCH: 29.3 pg (ref 26.0–34.0)
MCHC: 34.2 g/dL (ref 30.0–36.0)
MCV: 85.7 fL (ref 80.0–100.0)
Monocytes Absolute: 0.7 10*3/uL (ref 0.1–1.0)
Monocytes Relative: 12 %
Neutro Abs: 4.3 10*3/uL (ref 1.7–7.7)
Neutrophils Relative %: 74 %
Platelet Count: 238 10*3/uL (ref 150–400)
RBC: 4.47 MIL/uL (ref 3.87–5.11)
RDW: 13 % (ref 11.5–15.5)
WBC Count: 5.8 10*3/uL (ref 4.0–10.5)
nRBC: 0 % (ref 0.0–0.2)

## 2018-12-05 NOTE — Progress Notes (Signed)
REFERRING PROVIDER: Ladell Pier, MD [address] [place], [place] [address]  PRIMARY PROVIDER:  Lawerance Cruel, MD  PRIMARY REASON FOR VISIT:  1. Family history of breast cancer   2. Family history of colon cancer   3. Family history of malignant melanoma   4. Mass of pancreas      HISTORY OF PRESENT ILLNESS:   Tiffany [name], a 85 y.o. female, was seen for a [facility] cancer genetics consultation at the request of Dr. Benay Spice due to a personal and family history of cancer.  Tiffany [name] presents to clinic today to discuss the possibility of a hereditary predisposition to cancer, genetic testing, and to further clarify her future cancer risks, as well as potential cancer risks for family members.   Tiffany [name] is suspected to have pancreatic cancer with lung metastases and carcinomatosis. The treatment plan at this time includes systemic therapy upon confirmation of the diagnosis by biopsy.   RISK FACTORS:  Menarche was at age 89.  First live birth at age 7.  OCP use for approximately 0 years.  Ovaries intact: no.  Hysterectomy: yes.  Menopausal status: postmenopausal.  HRT use: 0 years. Colonoscopy: yes; she reports history of a few polyps. Mammogram within the last year: no. Number of breast biopsies: 0. Any excessive radiation exposure in the past: no  Past Medical History:  Diagnosis Date  . Aortic stenosis, mild    ECHO 09  . Bilateral cataracts   . DOE (dyspnea on exertion)   . Facial basal cell cancer    nose-/dr, Allyson Sabal  . Family history of breast cancer   . Family history of colon cancer   . Family history of malignant melanoma   . Hearing loss in left ear    resolved after stapedectomy  . Heart murmur   . History of colon polyps   . Hyperlipidemia   . Hypertension   . Hypertrophic obstructive cardiomyopathy ([place])   . Mass of pancreas   . OA (osteoarthritis)   . Osteoporosis   . Retinal vein occlusion    Dr. Starling Manns, Left eye, receives  injections  . Varicose veins   . Vitamin D deficiency     Past Surgical History:  Procedure Laterality Date  . COLONOSCOPY  04/2000  . COLONOSCOPY W/ POLYPECTOMY  1988  . CYST EXCISION N/A 04/07/2018   Procedure: EXCISION EPIDERMAL CYST POSTERIOR NECK;  Surgeon: Armandina Gemma, MD;  Location: [facility];  Service: General;  Laterality: N/A;  . ELBOW SURGERY    . EXTERNAL EAR SURGERY Right 1974   Stapedectomy  . EYE SURGERY Bilateral    cataract  . SHOULDER SURGERY    . VAGINAL HYSTERECTOMY     without oopherectomy for birth control age 34    Social History   Socioeconomic History  . Marital status: Divorced    Spouse name: Not on file  . Number of children: 5  . Years of education: college  . Highest education level: Not on file  Occupational History  . Occupation: retired  Scientific laboratory technician  . Financial resource strain: Not on file  . Food insecurity:    Worry: Not on file    Inability: Not on file  . Transportation needs:    Medical: Not on file    Non-medical: Not on file  Tobacco Use  . Smoking status: Never Smoker  . Smokeless tobacco: Never Used  Substance and Sexual Activity  . Alcohol use: Yes    Comment:  occ  . Drug use: No  . Sexual activity: Not on file  Lifestyle  . Physical activity:    Days per week: Not on file    Minutes per session: Not on file  . Stress: Not on file  Relationships  . Social connections:    Talks on phone: Not on file    Gets together: Not on file    Attends religious service: Not on file    Active member of club or organization: Not on file    Attends meetings of clubs or organizations: Not on file    Relationship status: Not on file  Other Topics Concern  . Not on file  Social History Narrative  . Not on file     FAMILY HISTORY:  We obtained a detailed, 4-generation family history.  Significant diagnoses are listed below: Family History  Problem Relation Age of Onset  . Melanoma Mother        dx 54s, d.  54  . Cerebral aneurysm Mother   . Hypertension Mother   . CAD Father   . Breast cancer Sister 4  . Colon cancer Brother 51  . Hypertension Brother   . Hypertension Son   . Hypertension Son    Tiffany [name] has 3 sons and 2 daughters. Her sons are 24, 50, and 78, her daughters are 77 and 76, no cancers for any of them. No cancers for her grandchildren. Tiffany [name] also had one brother and 2 sisters. Her brother had colon cancer in his 67s, died at 53. His daughter had lymphoma and died in her 43s. Ms. Gingras sister had breast cancer at 64 and is living at 40. Tiffany [name] other sister is living at 60 with no cancer history.  Tiffany [name] mother had a melanoma on her forehead in her 82s that metastasized and she passed away at 28. Tiffany [name] had 8 maternal uncles, 7 maternal aunts, no cancers she is aware of. No cancers in her maternal cousins that she is aware of. She does not have information about her maternal grandparents.  Tiffany [name] father died at 73. Tiffany [name] had 3 paternal aunts, no cancers she is aware of in them. No cancers in her paternal cousins. Her paternal grandfather died at 47. She does not have information about her paternal grandmother.   Tiffany [name] is unaware of previous family history of genetic testing for hereditary cancer risks. Patient's maternal ancestors are of English/American [place] descent, and paternal ancestors are of European descent. There is no reported Ashkenazi Jewish ancestry. There is no known consanguinity.  GENETIC COUNSELING ASSESSMENT: Tiffany [name] is a 85 y.o. female with a personal and family history which is somewhat suggestive a hereditary cancer syndrome such as Hereditary Breast and Ovarian Syndrome, and predisposition to cancer. We, therefore, discussed and recommended the following at today's visit.   DISCUSSION: We discussed that 5-10% of cancer cases are hereditary. We discussed the BRCA1/BRCA2 genes in particular, noting there are other genes associated  with pancreatic cancer such as CDKN2A, Lynch syndrome genes, etc. We discussed that there are other genes associated with other cancers as well. We reviewed the characteristics, features and inheritance patterns of hereditary cancer syndromes. We also discussed genetic testing, including the appropriate family members to test, the process of testing, insurance coverage and turn-around-time for results. We discussed the implications of a negative, positive and/or variant of uncertain significant result. We recommended Tiffany [name] pursue genetic testing for the Common Hereditary Cancers Panel + Melanoma  gene panel.   The Common Hereditary Cancers Panel offered by [facility] includes sequencing and/or deletion duplication testing of the following 47 genes: APC, ATM, AXIN2, BARD1, BMPR1A, BRCA1, BRCA2, BRIP1, CDH1, CDKN2A (p14ARF), CDKN2A (p16INK4a), CKD4, CHEK2, CTNNA1, DICER1, EPCAM (Deletion/duplication testing only), GREM1 (promoter region deletion/duplication testing only), KIT, MEN1, MLH1, MSH2, MSH3, MSH6, MUTYH, NBN, NF1, NHTL1, PALB2, PDGFRA, PMS2, POLD1, POLE, PTEN, RAD50, RAD51C, RAD51D, SDHB, SDHC, SDHD, SMAD4, SMARCA4. STK11, TP53, TSC1, TSC2, and VHL.  The following genes were evaluated for sequence changes only: SDHA and HOXB13 c.251G>A variant only.  The [facility] Melanoma Panel analyzed the following 9 genes: BAP1 BRCA2 CDK4 CDKN2A MITF POT1 PTEN RB1 Tp53/  Based on Tiffany [name]'s personal and family history of cancer, she meets medical criteria for genetic testing. Despite that she meets criteria, she may still have an out of pocket cost.   PLAN: After considering the risks, benefits, and limitations, Tiffany [name] provided informed consent to pursue genetic testing and the blood sample was sent to [facility] for analysis of the Common Hereditary Cancers Panel + Melanoma Panel. Results should be available within approximately 2-3 weeks' time, at which point they will be disclosed by telephone to  Tiffany [name], as will any additional recommendations warranted by these results. Tiffany [name] will receive a summary of her genetic counseling visit and a copy of her results once available. This information will also be available in Epic.   Lastly, we encouraged Tiffany [name] to remain in contact with cancer genetics annually so that we can continuously update the family history and inform her of any changes in cancer genetics and testing that may be of benefit for this family.   Ms. Matura questions were answered to her satisfaction today. Our contact information was provided should additional questions or concerns arise. Thank you for the referral and allowing [place] to share in the care of your patient.   Faith Rogue, MS Genetic Counselor [email or web address] Phone: [phone]  The patient was seen for a total of 30 minutes in face-to-face genetic counseling. She was accompanied by her daughter, [name]. Drs. Magrinat, Lindi Adie and/or Burr Medico were available for discussion regarding this case.

## 2018-12-05 NOTE — Progress Notes (Signed)
MD ordered repeat CMP on 12/09/18. Order placed and scheduling message sent/

## 2018-12-06 ENCOUNTER — Encounter (HOSPITAL_COMMUNITY): Payer: Self-pay

## 2018-12-06 ENCOUNTER — Ambulatory Visit (HOSPITAL_COMMUNITY)
Admission: RE | Admit: 2018-12-06 | Discharge: 2018-12-06 | Disposition: A | Discharge status: Home / Self Care | Payer: Medicare Other | Source: Ambulatory Visit | Attending: Nurse Practitioner | Admitting: Nurse Practitioner

## 2018-12-06 ENCOUNTER — Other Ambulatory Visit: Payer: Self-pay | Admitting: Nurse Practitioner

## 2018-12-06 DIAGNOSIS — C251 Malignant neoplasm of body of pancreas: Secondary | ICD-10-CM

## 2018-12-06 DIAGNOSIS — K8689 Other specified diseases of pancreas: Secondary | ICD-10-CM | POA: Insufficient documentation

## 2018-12-06 DIAGNOSIS — C7800 Secondary malignant neoplasm of unspecified lung: Secondary | ICD-10-CM | POA: Diagnosis not present

## 2018-12-06 DIAGNOSIS — C259 Malignant neoplasm of pancreas, unspecified: Secondary | ICD-10-CM | POA: Diagnosis not present

## 2018-12-06 LAB — CANCER ANTIGEN 19-9: CA 19-9: 33 U/mL (ref 0–35)

## 2018-12-06 MED ORDER — IOHEXOL 300 MG/ML  SOLN
100.0000 mL | Freq: Once | INTRAMUSCULAR | Status: AC | PRN
  Administered 2018-12-06: 100 mL via INTRAVENOUS

## 2018-12-06 MED ORDER — SODIUM CHLORIDE (PF) 0.9 % IJ SOLN
INTRAMUSCULAR | Status: AC
  Filled 2018-12-06: qty 50

## 2018-12-07 ENCOUNTER — Telehealth: Payer: Self-pay | Admitting: *Deleted

## 2018-12-07 NOTE — Telephone Encounter (Signed)
Called daughter to reschedule her 3/20 visit for 12/15/18 since her biopsy is not until 12/12/18.

## 2018-12-09 ENCOUNTER — Other Ambulatory Visit: Payer: Self-pay | Admitting: Radiology

## 2018-12-09 ENCOUNTER — Ambulatory Visit: Payer: Medicare Other | Admitting: Oncology

## 2018-12-09 ENCOUNTER — Other Ambulatory Visit: Payer: Medicare Other

## 2018-12-11 ENCOUNTER — Other Ambulatory Visit: Payer: Self-pay | Admitting: Radiology

## 2018-12-12 ENCOUNTER — Other Ambulatory Visit: Payer: Self-pay

## 2018-12-12 ENCOUNTER — Telehealth: Payer: Self-pay | Admitting: Licensed Clinical Social Worker

## 2018-12-12 ENCOUNTER — Ambulatory Visit (HOSPITAL_COMMUNITY)
Admission: RE | Admit: 2018-12-12 | Discharge: 2018-12-12 | Disposition: A | Discharge status: Home / Self Care | Payer: Medicare Other | Source: Ambulatory Visit | Attending: Nurse Practitioner | Admitting: Nurse Practitioner

## 2018-12-12 ENCOUNTER — Encounter (HOSPITAL_COMMUNITY): Payer: Self-pay

## 2018-12-12 DIAGNOSIS — Z808 Family history of malignant neoplasm of other organs or systems: Secondary | ICD-10-CM | POA: Diagnosis not present

## 2018-12-12 DIAGNOSIS — Z79899 Other long term (current) drug therapy: Secondary | ICD-10-CM | POA: Diagnosis not present

## 2018-12-12 DIAGNOSIS — R1900 Intra-abdominal and pelvic swelling, mass and lump, unspecified site: Secondary | ICD-10-CM | POA: Diagnosis not present

## 2018-12-12 DIAGNOSIS — Z9109 Other allergy status, other than to drugs and biological substances: Secondary | ICD-10-CM | POA: Insufficient documentation

## 2018-12-12 DIAGNOSIS — I10 Essential (primary) hypertension: Secondary | ICD-10-CM | POA: Insufficient documentation

## 2018-12-12 DIAGNOSIS — Z800 Family history of malignant neoplasm of digestive organs: Secondary | ICD-10-CM | POA: Insufficient documentation

## 2018-12-12 DIAGNOSIS — C801 Malignant (primary) neoplasm, unspecified: Secondary | ICD-10-CM | POA: Diagnosis not present

## 2018-12-12 DIAGNOSIS — E559 Vitamin D deficiency, unspecified: Secondary | ICD-10-CM | POA: Diagnosis not present

## 2018-12-12 DIAGNOSIS — Z8249 Family history of ischemic heart disease and other diseases of the circulatory system: Secondary | ICD-10-CM | POA: Diagnosis not present

## 2018-12-12 DIAGNOSIS — K8689 Other specified diseases of pancreas: Secondary | ICD-10-CM

## 2018-12-12 DIAGNOSIS — Z803 Family history of malignant neoplasm of breast: Secondary | ICD-10-CM | POA: Diagnosis not present

## 2018-12-12 DIAGNOSIS — R1909 Other intra-abdominal and pelvic swelling, mass and lump: Secondary | ICD-10-CM | POA: Diagnosis not present

## 2018-12-12 DIAGNOSIS — C7989 Secondary malignant neoplasm of other specified sites: Secondary | ICD-10-CM | POA: Diagnosis not present

## 2018-12-12 DIAGNOSIS — C251 Malignant neoplasm of body of pancreas: Secondary | ICD-10-CM | POA: Diagnosis not present

## 2018-12-12 LAB — CBC
HCT: 38.9 % (ref 36.0–46.0)
HEMOGLOBIN: 12.9 g/dL (ref 12.0–15.0)
MCH: 28.2 pg (ref 26.0–34.0)
MCHC: 33.2 g/dL (ref 30.0–36.0)
MCV: 85.1 fL (ref 80.0–100.0)
Platelets: 261 10*3/uL (ref 150–400)
RBC: 4.57 MIL/uL (ref 3.87–5.11)
RDW: 13 % (ref 11.5–15.5)
WBC: 5.2 10*3/uL (ref 4.0–10.5)
nRBC: 0 % (ref 0.0–0.2)

## 2018-12-12 LAB — PROTIME-INR
INR: 1 (ref 0.8–1.2)
Prothrombin Time: 13 seconds (ref 11.4–15.2)

## 2018-12-12 MED ORDER — MIDAZOLAM HCL 2 MG/2ML IJ SOLN
INTRAMUSCULAR | Status: AC | PRN
  Administered 2018-12-12: 1 mg via INTRAVENOUS

## 2018-12-12 MED ORDER — FENTANYL CITRATE (PF) 100 MCG/2ML IJ SOLN
INTRAMUSCULAR | Status: AC | PRN
  Administered 2018-12-12: 25 ug via INTRAVENOUS

## 2018-12-12 MED ORDER — FENTANYL CITRATE (PF) 100 MCG/2ML IJ SOLN
INTRAMUSCULAR | Status: AC
  Filled 2018-12-12: qty 2

## 2018-12-12 MED ORDER — LIDOCAINE-EPINEPHRINE 1 %-1:100000 IJ SOLN
INTRAMUSCULAR | Status: AC
  Filled 2018-12-12: qty 1

## 2018-12-12 MED ORDER — MIDAZOLAM HCL 2 MG/2ML IJ SOLN
INTRAMUSCULAR | Status: AC
  Filled 2018-12-12: qty 2

## 2018-12-12 NOTE — H&P (Signed)
Chief Complaint: Patient was seen in consultation today for peritoneal node biopsy at the request of Ross,Charles Alan  Referring Physician(s): Ross,Charles Antony Haste  Supervising Physician: Sandi Mariscal  Patient Status: [facility] - Out-pt  History of Present Illness: Tiffany [name] is a 85 y.o. female   I mo Hx abd pain Weakness; wt loss  Work up imaging: CT 12/06/18: IMPRESSION: 3.3 cm mass along the posterior aspect of the pancreatic body/tail, corresponding to the patient's known pancreatic cancer. Associated involvement of the celiac axis and occlusion of the splenic vein. Associated peritoneal disease/omental caking, as above. Trace pelvic ascites, malignant. Widespread/numerous pulmonary metastases, with index lesions as above. Large left thyroid mass, measuring 8.1   Scheduled now for peritoneal nodule biopsy   Past Medical History:  Diagnosis Date   Aortic stenosis, mild    ECHO 09   Bilateral cataracts    DOE (dyspnea on exertion)    Facial basal cell cancer    nose-/dr, Allyson Sabal   Family history of breast cancer    Family history of colon cancer    Family history of malignant melanoma    Hearing loss in left ear    resolved after stapedectomy   Heart murmur    History of colon polyps    Hyperlipidemia    Hypertension    Hypertrophic obstructive cardiomyopathy (HCC)    Mass of pancreas    OA (osteoarthritis)    Osteoporosis    Retinal vein occlusion    Dr. Starling Manns, Left eye, receives injections   Varicose veins    Vitamin D deficiency     Past Surgical History:  Procedure Laterality Date   COLONOSCOPY  04/2000   COLONOSCOPY W/ POLYPECTOMY  1988   CYST EXCISION N/A 04/07/2018   Procedure: EXCISION EPIDERMAL CYST POSTERIOR NECK;  Surgeon: Armandina Gemma, MD;  Location: [place];  Service: General;  Laterality: N/A;   ELBOW SURGERY     EXTERNAL EAR SURGERY Right 1974   Stapedectomy   EYE SURGERY Bilateral    cataract   SHOULDER SURGERY     VAGINAL HYSTERECTOMY     without oopherectomy for birth control age 61    Allergies: Other  Medications: Prior to Admission medications   Medication Sig Start Date End Date Taking? Authorizing Provider  diltiazem (CARDIZEM CD) 180 MG 24 hr capsule Take 1 capsule (180 mg total) by mouth daily. 04/12/18  Yes Belva Crome, MD  lisinopril-hydrochlorothiazide (PRINZIDE,ZESTORETIC) 20-25 MG tablet Take 1 tablet by mouth daily.   Yes [provider]  Misc Natural Products (OSTEO BI-FLEX ADV DOUBLE ST PO) Take 1 tablet by mouth daily.   Yes [provider]  Multiple Vitamins-Minerals (PRESERVISION AREDS) CAPS Take 1 capsule by mouth daily.   Yes [provider]  traMADol (ULTRAM) 50 MG tablet Take 1 tablet (50 mg total) by mouth every 12 (twelve) hours as needed. 12/02/18  Yes Owens Shark, NP  Cholecalciferol (VITAMIN D) 2000 units CAPS Take 1 capsule by mouth daily.    [provider]     Family History  Problem Relation Age of Onset   Melanoma Mother        dx 37s, d. 11   Cerebral aneurysm Mother    Hypertension Mother    CAD Father    Breast cancer Sister 30   Colon cancer Brother 93   Hypertension Brother    Hypertension Son    Hypertension Son     Social History   Socioeconomic  History   Marital status: Divorced    Spouse name: Not on file   Number of children: 5   Years of education: college   Highest education level: Not on file  Occupational History   Occupation: retired  Scientist, product/process development strain: Not on file   Food insecurity:    Worry: Not on file    Inability: Not on Lexicographer needs:    Medical: Not on file    Non-medical: Not on file  Tobacco Use   Smoking status: Never Smoker   Smokeless tobacco: Never Used  Substance and Sexual Activity   Alcohol use: Yes    Comment: occ   Drug use: No   Sexual activity: Not on file  Lifestyle     Physical activity:    Days per week: Not on file    Minutes per session: Not on file   Stress: Not on file  Relationships   Social connections:    Talks on phone: Not on file    Gets together: Not on file    Attends religious service: Not on file    Active member of club or organization: Not on file    Attends meetings of clubs or organizations: Not on file    Relationship status: Not on file  Other Topics Concern   Not on file  Social History Narrative   Not on file    Review of Systems: A 12 point ROS discussed and pertinent positives are indicated in the HPI above.  All other systems are negative.  Review of Systems  Constitutional: Positive for activity change, appetite change, fatigue and unexpected weight change.  Respiratory: Positive for cough.   Cardiovascular: Negative for chest pain.  Gastrointestinal: Positive for abdominal pain and nausea.  Neurological: Positive for weakness.  Psychiatric/Behavioral: Negative for behavioral problems and confusion.    Vital Signs: BP 138/78    Pulse 91    Temp 97.6 F (36.4 C)    Ht 5\' 3"  (1.6 m)    Wt 140 lb (63.5 kg)    SpO2 97%    BMI 24.80 kg/m   Physical Exam Vitals signs reviewed.  Cardiovascular:     Rate and Rhythm: Normal rate and regular rhythm.     Heart sounds: Murmur present.  Pulmonary:     Breath sounds: Normal breath sounds.  Abdominal:     Palpations: Abdomen is soft.  Musculoskeletal: Normal range of motion.  Skin:    General: Skin is warm and dry.  Neurological:     General: No focal deficit present.     Mental Status: She is alert.  Psychiatric:        Mood and Affect: Mood normal.        Behavior: Behavior normal.        Thought Content: Thought content normal.        Judgment: Judgment normal.     Imaging: Ct Chest W Contrast  Result Date: 12/06/2018 CLINICAL DATA:  Pancreatic cancer, cough, weight loss EXAM: CT CHEST, ABDOMEN, AND PELVIS WITH CONTRAST TECHNIQUE: Multidetector CT  imaging of the chest, abdomen and pelvis was performed following the standard protocol during bolus administration of intravenous contrast. CONTRAST:  160mL OMNIPAQUE IOHEXOL 300 MG/ML  SOLN COMPARISON:  MRI abdomen dated 11/25/2018 FINDINGS: CT CHEST FINDINGS Cardiovascular: The heart is normal in size. No pericardial effusion. No evidence thoracic aortic aneurysm. Atherosclerotic calcifications of the aortic arch. Coronary atherosclerosis the LAD and right coronary artery.  Mediastinum/Nodes: No suspicious mediastinal, hilar, or axillary lymphadenopathy. Large left thyroid nodule measuring at least 8.1 cm (coronal image 45). Lungs/Pleura: Numerous bilateral pulmonary nodules/masses, highly suspicious for metastatic disease in this patient. Dominant/index lesions include: --1.9 cm nodule in the posterior left upper lobe (series 12/image 68) --2.2 cm nodule in the posterior right upper lobe (series 12/image 76) --3.0 cm mass in the posterior right lower lobe (series 12/image 116) --3.5 cm mass in the lateral left lower lobe (series 12/image 137) Biapical pleural-parenchymal scarring. No focal consolidation. No pleural effusion or pneumothorax. Musculoskeletal: Mild degenerative changes of the thoracic spine. CT ABDOMEN PELVIS FINDINGS Hepatobiliary: Liver is within normal limits. Gallbladder is unremarkable. No intrahepatic or extrahepatic ductal dilatation. Pancreas: 2.1 x 3.3 cm mass along the posterior aspect of the pancreatic body/tail (series 2/image 38), corresponding to the patient's known pancreatic cancer. Associated involvement of the celiac axis. No pancreatic ductal dilatation or atrophy. Additional 13 mm fluid density lesion in the pancreatic head. Spleen: Within normal limits. Adrenals/Urinary Tract: 1.8 cm right adrenal nodule, characterized as a benign adrenal adenoma on MR. Left adrenal gland is within normal limits. Kidneys are within normal limits.  No hydronephrosis. Bladder is underdistended but  unremarkable. Stomach/Bowel: Stomach is within normal limits. No evidence of bowel obstruction. Normal appendix (series 7/image 161). Sigmoid diverticulosis, without evidence of diverticulitis. Vascular/Lymphatic: No evidence of abdominal aortic aneurysm. Atherosclerotic calcifications of the abdominal aorta and branch vessels. Involvement of the celiac axis, as described above. Occlusion of the splenic vein. Dilated thoracic duct in the right retrocrural space (series 7/image 92). No suspicious abdominopelvic lymphadenopathy. Reproductive: Status post hysterectomy. Bilateral ovaries are unremarkable. Other: Small volume pelvic ascites, with peritoneal thickening/nodularity, malignant. Associated peritoneal disease/omental caking beneath the anterior abdominal wall (for example, series 7/image 136). Musculoskeletal: Moderate compression fracture deformity at L1. No retropulsion. IMPRESSION: 3.3 cm mass along the posterior aspect of the pancreatic body/tail, corresponding to the patient's known pancreatic cancer. Associated involvement of the celiac axis and occlusion of the splenic vein. Associated peritoneal disease/omental caking, as above. Trace pelvic ascites, malignant. Widespread/numerous pulmonary metastases, with index lesions as above. Large left thyroid mass, measuring 8.1 cm, of questionable clinical significance given the additional findings. Electronically Signed   By: Julian Hy M.D.   On: 12/06/2018 10:06   US Abdomen Complete  Result Date: 11/25/2018 CLINICAL DATA:  RIGHT UPPER QUADRANT pain for 3 weeks. EXAM: ABDOMEN ULTRASOUND COMPLETE COMPARISON:  None. FINDINGS: Gallbladder: Gallbladder has a normal appearance. Gallbladder wall is 1.4 millimeters, within normal limits. No stones or pericholecystic fluid. No sonographic Murphy's sign. Common bile duct: Diameter: 2.5 millimeters Liver: Normal echotexture. A small cyst is identified in the caudate lobe measuring 0.7 x 0.6 x 0.7  centimeters. Portal vein is patent on color Doppler imaging with normal direction of blood flow towards the liver. IVC: No abnormality visualized. Pancreas: A 1.0 x 1.0 x 0.9 centimeters cyst is identified in the pancreatic head region. Spleen: Size and appearance within normal limits. Right Kidney: Length: 11.3 centimeters. No mass or hydronephrosis visualized. Left Kidney: Length: 10.3 centimeters. Possible parapelvic cysts versus focal dilatation LOWER pole collecting system. No solid mass. Abdominal aorta: 2.1 centimeters. There is atherosclerosis of the abdominal aorta. Other findings: Superior to the pancreas, a probable lymph node is 5.5 millimeters. IMPRESSION: 1. No evidence for acute cholecystitis. 2. Caudate lobe cyst. 3. 1.0 centimeter pancreatic head cyst. No other pancreatic lesions are identified. 5.5 millimeter lymph node adjacent to the pancreatic head. Recommend further evaluation with  MR/MRCP. If patient is not a candidate for MRI, recommend CT of the abdomen and pelvis with contrast for evaluation of the pancreas. 4. These results will be called to the ordering clinician or representative by the Radiologist Assistant, and communication documented in the PACS or zVision Dashboard. Electronically Signed   By: Nolon Nations M.D.   On: 11/25/2018 08:36   Ct Abdomen Pelvis W Contrast  Result Date: 12/06/2018 CLINICAL DATA:  Pancreatic cancer, cough, weight loss EXAM: CT CHEST, ABDOMEN, AND PELVIS WITH CONTRAST TECHNIQUE: Multidetector CT imaging of the chest, abdomen and pelvis was performed following the standard protocol during bolus administration of intravenous contrast. CONTRAST:  149mL OMNIPAQUE IOHEXOL 300 MG/ML  SOLN COMPARISON:  MRI abdomen dated 11/25/2018 FINDINGS: CT CHEST FINDINGS Cardiovascular: The heart is normal in size. No pericardial effusion. No evidence thoracic aortic aneurysm. Atherosclerotic calcifications of the aortic arch. Coronary atherosclerosis the LAD and right  coronary artery. Mediastinum/Nodes: No suspicious mediastinal, hilar, or axillary lymphadenopathy. Large left thyroid nodule measuring at least 8.1 cm (coronal image 45). Lungs/Pleura: Numerous bilateral pulmonary nodules/masses, highly suspicious for metastatic disease in this patient. Dominant/index lesions include: --1.9 cm nodule in the posterior left upper lobe (series 12/image 68) --2.2 cm nodule in the posterior right upper lobe (series 12/image 76) --3.0 cm mass in the posterior right lower lobe (series 12/image 116) --3.5 cm mass in the lateral left lower lobe (series 12/image 137) Biapical pleural-parenchymal scarring. No focal consolidation. No pleural effusion or pneumothorax. Musculoskeletal: Mild degenerative changes of the thoracic spine. CT ABDOMEN PELVIS FINDINGS Hepatobiliary: Liver is within normal limits. Gallbladder is unremarkable. No intrahepatic or extrahepatic ductal dilatation. Pancreas: 2.1 x 3.3 cm mass along the posterior aspect of the pancreatic body/tail (series 2/image 38), corresponding to the patient's known pancreatic cancer. Associated involvement of the celiac axis. No pancreatic ductal dilatation or atrophy. Additional 13 mm fluid density lesion in the pancreatic head. Spleen: Within normal limits. Adrenals/Urinary Tract: 1.8 cm right adrenal nodule, characterized as a benign adrenal adenoma on MR. Left adrenal gland is within normal limits. Kidneys are within normal limits.  No hydronephrosis. Bladder is underdistended but unremarkable. Stomach/Bowel: Stomach is within normal limits. No evidence of bowel obstruction. Normal appendix (series 7/image 161). Sigmoid diverticulosis, without evidence of diverticulitis. Vascular/Lymphatic: No evidence of abdominal aortic aneurysm. Atherosclerotic calcifications of the abdominal aorta and branch vessels. Involvement of the celiac axis, as described above. Occlusion of the splenic vein. Dilated thoracic duct in the right retrocrural  space (series 7/image 92). No suspicious abdominopelvic lymphadenopathy. Reproductive: Status post hysterectomy. Bilateral ovaries are unremarkable. Other: Small volume pelvic ascites, with peritoneal thickening/nodularity, malignant. Associated peritoneal disease/omental caking beneath the anterior abdominal wall (for example, series 7/image 136). Musculoskeletal: Moderate compression fracture deformity at L1. No retropulsion. IMPRESSION: 3.3 cm mass along the posterior aspect of the pancreatic body/tail, corresponding to the patient's known pancreatic cancer. Associated involvement of the celiac axis and occlusion of the splenic vein. Associated peritoneal disease/omental caking, as above. Trace pelvic ascites, malignant. Widespread/numerous pulmonary metastases, with index lesions as above. Large left thyroid mass, measuring 8.1 cm, of questionable clinical significance given the additional findings. Electronically Signed   By: Julian Hy M.D.   On: 12/06/2018 10:06   Mr 3d Recon At Scanner  Result Date: 11/25/2018 CLINICAL DATA:  Right upper quadrant abdominal pain. Small pancreatic head cystic lesion on ultrasound. EXAM: MRI ABDOMEN WITHOUT AND WITH CONTRAST (INCLUDING MRCP) TECHNIQUE: Multiplanar multisequence MR imaging of the abdomen was performed both before and  after the administration of intravenous contrast. Heavily T2-weighted images of the biliary and pancreatic ducts were obtained, and three-dimensional MRCP images were rendered by post processing. CONTRAST:  7 cc Gadavist IV. COMPARISON:  11/24/2018 abdominal sonogram. FINDINGS: Lower chest: Multiple lung masses and pulmonary nodules are scattered throughout both lung bases measuring up to 3.5 cm at the left lung base (series 8/image 12) and 2.6 cm at the posterior right lung base (series 8/image 7). Hepatobiliary: Normal liver size and configuration. No hepatic steatosis. Simple 0.9 cm caudate lobe cyst. No suspicious liver masses. Normal  gallbladder with no cholelithiasis. No biliary ductal dilatation. Common bile duct diameter 3 mm. No choledocholithiasis. Pancreas: There is an irregular poorly marginated hypoenhancing 3.2 x 2.2 cm posterior pancreatic body mass (series 1104/image 54), which encases the celiac artery at its trifurcation. There are separate 1.4 cm pancreatic neck (series 400/image 63) and 1.2 cm posterior pancreatic head (series 400/image 69) cystic masses that communicate with the main pancreatic duct and demonstrate no wall thickening, enhancement or thick internal septations. Main pancreatic duct diameter is 3 mm in the pancreatic head and neck. Abrupt cut off of main pancreatic duct at the level of the pancreatic body mass. No pancreas divisum. Spleen: Normal size. No mass. Adrenals/Urinary Tract: Right adrenal 1.9 cm nodule demonstrates prominent loss of signal intensity on out of phase chemical shift imaging compatible with a benign adenoma. No left adrenal nodules. No hydronephrosis. Simple parapelvic renal cysts in the left kidney. No suspicious renal masses. Stomach/Bowel: Normal non-distended stomach. Visualized small and large bowel is normal caliber, with no bowel wall thickening. Vascular/Lymphatic: Atherosclerotic nonaneurysmal abdominal aorta. Patent hepatic, portal and renal veins. Splenic vein is occluded at the level of the pancreatic body mass. Patent SMV. No pathologically enlarged lymph nodes in the abdomen. Other: Trace upper abdominal ascites. No focal fluid collections. Widespread soft tissue implants throughout omentum measuring up to 2.3 cm in the left omentum (series 1103/image 80). Numerous small soft tissue implants throughout perihepatic space 5, for example a 1.1 cm right posterior perihepatic nodule (series 1103/image 51). Musculoskeletal: No aggressive appearing focal osseous lesions. IMPRESSION: 1. Locally advanced/inoperable primary pancreatic adenocarcinoma in the pancreatic body, encasing the  celiac artery. Occluded splenic vein. 2. Extensive peritoneal carcinomatosis with soft tissue metastases throughout omentum and perihepatic space. Trace malignant ascites. 3. Bulky pulmonary metastases throughout both lung bases. 4. No biliary ductal dilatation. 5. Right adrenal adenoma. 6.  Aortic Atherosclerosis (ICD10-I70.0). Electronically Signed   By: Ilona Sorrel M.D.   On: 11/25/2018 19:49   Mr Abdomen Mrcp Moise Boring Contast  Result Date: 11/25/2018 CLINICAL DATA:  Right upper quadrant abdominal pain. Small pancreatic head cystic lesion on ultrasound. EXAM: MRI ABDOMEN WITHOUT AND WITH CONTRAST (INCLUDING MRCP) TECHNIQUE: Multiplanar multisequence MR imaging of the abdomen was performed both before and after the administration of intravenous contrast. Heavily T2-weighted images of the biliary and pancreatic ducts were obtained, and three-dimensional MRCP images were rendered by post processing. CONTRAST:  7 cc Gadavist IV. COMPARISON:  11/24/2018 abdominal sonogram. FINDINGS: Lower chest: Multiple lung masses and pulmonary nodules are scattered throughout both lung bases measuring up to 3.5 cm at the left lung base (series 8/image 12) and 2.6 cm at the posterior right lung base (series 8/image 7). Hepatobiliary: Normal liver size and configuration. No hepatic steatosis. Simple 0.9 cm caudate lobe cyst. No suspicious liver masses. Normal gallbladder with no cholelithiasis. No biliary ductal dilatation. Common bile duct diameter 3 mm. No choledocholithiasis. Pancreas: There is an  irregular poorly marginated hypoenhancing 3.2 x 2.2 cm posterior pancreatic body mass (series 1104/image 54), which encases the celiac artery at its trifurcation. There are separate 1.4 cm pancreatic neck (series 400/image 63) and 1.2 cm posterior pancreatic head (series 400/image 69) cystic masses that communicate with the main pancreatic duct and demonstrate no wall thickening, enhancement or thick internal septations. Main pancreatic  duct diameter is 3 mm in the pancreatic head and neck. Abrupt cut off of main pancreatic duct at the level of the pancreatic body mass. No pancreas divisum. Spleen: Normal size. No mass. Adrenals/Urinary Tract: Right adrenal 1.9 cm nodule demonstrates prominent loss of signal intensity on out of phase chemical shift imaging compatible with a benign adenoma. No left adrenal nodules. No hydronephrosis. Simple parapelvic renal cysts in the left kidney. No suspicious renal masses. Stomach/Bowel: Normal non-distended stomach. Visualized small and large bowel is normal caliber, with no bowel wall thickening. Vascular/Lymphatic: Atherosclerotic nonaneurysmal abdominal aorta. Patent hepatic, portal and renal veins. Splenic vein is occluded at the level of the pancreatic body mass. Patent SMV. No pathologically enlarged lymph nodes in the abdomen. Other: Trace upper abdominal ascites. No focal fluid collections. Widespread soft tissue implants throughout omentum measuring up to 2.3 cm in the left omentum (series 1103/image 80). Numerous small soft tissue implants throughout perihepatic space 5, for example a 1.1 cm right posterior perihepatic nodule (series 1103/image 51). Musculoskeletal: No aggressive appearing focal osseous lesions. IMPRESSION: 1. Locally advanced/inoperable primary pancreatic adenocarcinoma in the pancreatic body, encasing the celiac artery. Occluded splenic vein. 2. Extensive peritoneal carcinomatosis with soft tissue metastases throughout omentum and perihepatic space. Trace malignant ascites. 3. Bulky pulmonary metastases throughout both lung bases. 4. No biliary ductal dilatation. 5. Right adrenal adenoma. 6.  Aortic Atherosclerosis (ICD10-I70.0). Electronically Signed   By: Ilona Sorrel M.D.   On: 11/25/2018 19:49    Labs:  CBC: Recent Labs    04/07/18 0939 11/25/18 1503 12/05/18 1021 12/12/18 0953  WBC  --  4.8 5.8 5.2  HGB 13.9 12.7 13.1 12.9  HCT 41.0 39.0 38.3 38.9  PLT  --  259  238 261    COAGS: Recent Labs    12/12/18 0953  INR 1.0    BMP: Recent Labs    04/07/18 0939 11/25/18 1503 12/05/18 1021  NA 138 132* 131*  K 4.1 3.4* 4.2  CL 102 99 94*  CO2  --  24 27  GLUCOSE 103* 101* 110*  BUN 20 15 11   CALCIUM  --  9.9 10.6*  CREATININE 0.80 0.84 0.88  GFRNONAA  --  >60 58*  GFRAA  --  >60 >60    LIVER FUNCTION TESTS: Recent Labs    11/25/18 1503 12/05/18 1021  BILITOT 0.6 0.7  AST 23 55*  ALT 21 65*  ALKPHOS 51 69  PROT 6.0* 6.3*  ALBUMIN 3.5 3.6    TUMOR MARKERS: No results for input(s): AFPTM, CEA, CA199, CHROMGRNA in the last 8760 hours.  Assessment and Plan:  abd pain x 1 mo Wt loss Imaging reveals pancreatic mass; peritoneal nodule; pulmonary nodules Scheduled now for biopsy of peritoneal nodule Risks and benefits of peritoneal nodule biopsy was discussed with the patient and/or patient's family including, but not limited to bleeding, infection, damage to adjacent structures or low yield requiring additional tests.  All of the questions were answered and there is agreement to proceed. Consent signed and in chart.   Thank you for this interesting consult.  I greatly enjoyed meeting Honey C  Tinch and look forward to participating in their care.  A copy of this report was sent to the requesting provider on this date.  Electronically Signed: Lavonia Drafts, PA-C 12/12/2018, 11:03 AM   I spent a total of  30 Minutes   in face to face in clinical consultation, greater than 50% of which was counseling/coordinating care for peritoneal nodule biopsy

## 2018-12-12 NOTE — Discharge Instructions (Signed)
Needle Biopsy, Care After °These instructions tell you how to care for yourself after your procedure. Your doctor may also give you more specific instructions. Call your doctor if you have any problems or questions. °What can I expect after the procedure? °After the procedure, it is common to have: °· Soreness. °· Bruising. °· Mild pain. °Follow these instructions at home: ° °· Return to your normal activities as told by your doctor. Ask your doctor what activities are safe for you. °· Take over-the-counter and prescription medicines only as told by your doctor. °· Wash your hands with soap and water before you change your bandage (dressing). If you cannot use soap and water, use hand sanitizer. °· Follow instructions from your doctor about: °? How to take care of your puncture site. °? When and how to change your bandage. °? When to remove your bandage. °· Check your puncture site every day for signs of infection. Watch for: °? Redness, swelling, or pain. °? Fluid or blood.  °? Pus or a bad smell. °? Warmth. °· Do not take baths, swim, or use a hot tub until your doctor approves. Ask your doctor if you may take showers. You may only be allowed to take sponge baths. °· Keep all follow-up visits as told by your doctor. This is important. °Contact a doctor if you have: °· A fever. °· Redness, swelling, or pain at the puncture site, and it lasts longer than a few days. °· Fluid, blood, or pus coming from the puncture site. °· Warmth coming from the puncture site. °Get help right away if: °· You have a lot of bleeding from the puncture site. °Summary °· After the procedure, it is common to have soreness, bruising, or mild pain at the puncture site. °· Check your puncture site every day for signs of infection, such as redness, swelling, or pain. °· Get help right away if you have severe bleeding from your puncture site. °This information is not intended to replace advice given to you by your health care provider. Make  sure you discuss any questions you have with your health care provider. °Document Released: 08/20/2008 Document Revised: 09/20/2017 Document Reviewed: 09/20/2017 °Elsevier Interactive Patient Education © 2019 [employer] °Moderate Conscious Sedation, Adult, Care After °These instructions provide you with information about caring for yourself after your procedure. Your health care provider may also give you more specific instructions. Your treatment has been planned according to current medical practices, but problems sometimes occur. Call your health care provider if you have any problems or questions after your procedure. °What can I expect after the procedure? °After your procedure, it is common: °· To feel sleepy for several hours. °· To feel clumsy and have poor balance for several hours. °· To have poor judgment for several hours. °· To vomit if you eat too soon. °Follow these instructions at home: °For at least 24 hours after the procedure: ° °· Do not: °? Participate in activities where you could fall or become injured. °? Drive. °? Use heavy machinery. °? Drink alcohol. °? Take sleeping pills or medicines that cause drowsiness. °? Make important decisions or sign legal documents. °? Take care of children on your own. °· Rest. °Eating and drinking °· Follow the diet recommended by your health care provider. °· If you vomit: °? Drink water, juice, or soup when you can drink without vomiting. °? Make sure you have little or no nausea before eating solid foods. °General instructions °· Have a responsible adult stay   with you until you are awake and alert. °· Take over-the-counter and prescription medicines only as told by your health care provider. °· If you smoke, do not smoke without supervision. °· Keep all follow-up visits as told by your health care provider. This is important. °Contact a health care provider if: °· You keep feeling nauseous or you keep vomiting. °· You feel light-headed. °· You develop a  rash. °· You have a fever. °Get help right away if: °· You have trouble breathing. °This information is not intended to replace advice given to you by your health care provider. Make sure you discuss any questions you have with your health care provider. °Document Released: 06/28/2013 Document Revised: 02/10/2016 Document Reviewed: 12/28/2015 °Elsevier Interactive Patient Education © 2019 Elsevier Inc. ° °

## 2018-12-12 NOTE — Procedures (Signed)
Pre procedural Dx: Omental mass  Post procedural Dx: Same  Technically successful CT guided biopsy of indeterminate omental mass within the anterior aspect of the left mid hemi-abdomen.   EBL: None.  Complications: None immediate.   Ronny Bacon, MD Pager #: [phone]

## 2018-12-13 ENCOUNTER — Ambulatory Visit: Payer: Self-pay | Admitting: Licensed Clinical Social Worker

## 2018-12-13 ENCOUNTER — Encounter: Payer: Self-pay | Admitting: Licensed Clinical Social Worker

## 2018-12-13 DIAGNOSIS — Z1379 Encounter for other screening for genetic and chromosomal anomalies: Secondary | ICD-10-CM | POA: Insufficient documentation

## 2018-12-13 NOTE — Progress Notes (Signed)
HPI:  Ms. Doshier was previously seen in the [place] clinic due to a personal and family history of cancer and concerns regarding a hereditary predisposition to cancer. Please refer to our prior cancer genetics clinic note for more information regarding our discussion, assessment and recommendations, at the time. Ms. Livesay recent genetic test results were disclosed to her, as were recommendations warranted by these results. These results and recommendations are discussed in more detail below.  Ms. Coiner is suspected to have pancreatic cancer with lung metastases and carcinomatosis. The treatment plan at this time includes systemic therapy upon confirmation of the diagnosis by biopsy.   FAMILY HISTORY:  We obtained a detailed, 4-generation family history.  Significant diagnoses are listed below: Family History  Problem Relation Age of Onset  . Melanoma Mother        dx 42s, d. 36  . Cerebral aneurysm Mother   . Hypertension Mother   . CAD Father   . Breast cancer Sister 3  . Colon cancer Brother 62  . Hypertension Brother   . Hypertension Son   . Hypertension Son    Ms. Justo has 3 sons and 2 daughters. Her sons are 44, 1, and 29, her daughters are 72 and 52, no cancers for any of them. No cancers for her grandchildren. Ms. Offutt also had one brother and 2 sisters. Her brother had colon cancer in his 73s, died at 66. His daughter had lymphoma and died in her 62s. Ms. Mcculloh sister had breast cancer at 34 and is living at 68. Ms. Kalt other sister is living at 69 with no cancer history.  Ms. Sias mother had a melanoma on her forehead in her 60s that metastasized and she passed away at 74. Ms. Reiger had 8 maternal uncles, 7 maternal aunts, no cancers she is aware of. No cancers in her maternal cousins that she is aware of. She does not have information about her maternal grandparents.  Ms. Plante father died at 72. Ms. Boeh had 3 paternal aunts, no cancers she is  aware of in them. No cancers in her paternal cousins. Her paternal grandfather died at 68. She does not have information about her paternal grandmother.   Ms. Engert is unaware of previous family history of genetic testing for hereditary cancer risks. Patient's maternal ancestors are of English/American [place] descent, and paternal ancestors are of European descent. There is no reported Ashkenazi Jewish ancestry. There is no known consanguinity.  GENETIC TEST RESULTS: Genetic testing reported out on 12/10/2018 through the [facility] Common Hereditary Cancers Panel + Melanoma cancer panel found no pathogenic mutations.  The Common Hereditary Cancers Panel offered by [facility] includes sequencing and/or deletion duplication testing of the following 47 genes: APC, ATM, AXIN2, BARD1, BMPR1A, BRCA1, BRCA2, BRIP1, CDH1, CDKN2A (p14ARF), CDKN2A (p16INK4a), CKD4, CHEK2, CTNNA1, DICER1, EPCAM (Deletion/duplication testing only), GREM1 (promoter region deletion/duplication testing only), KIT, MEN1, MLH1, MSH2, MSH3, MSH6, MUTYH, NBN, NF1, NHTL1, PALB2, PDGFRA, PMS2, POLD1, POLE, PTEN, RAD50, RAD51C, RAD51D, SDHB, SDHC, SDHD, SMAD4, SMARCA4. STK11, TP53, TSC1, TSC2, and VHL.  The following genes were evaluated for sequence changes only: SDHA and HOXB13 c.251G>A variant only.  The [facility] Melanoma Panel analyzed the following 9 genes: BAP1 BRCA2 CDK4 CDKN2A MITF POT1 PTEN RB1 Tp53.  The test report has been scanned into EPIC and is located under the Molecular Pathology section of the Results Review tab.    We discussed with Ms. Mackley that because current genetic testing is not perfect, it  is possible there may be a gene mutation in one of these genes that current testing cannot detect, but that chance is small.  We also discussed, that there could be another gene that has not yet been discovered, or that we have not yet tested, that is responsible for the cancer diagnoses in the family. It is also possible there is a  hereditary cause for the cancer in the family that Ms. Coppock did not inherit and therefore was not identified in her testing.  Therefore, it is important to remain in touch with cancer genetics in the future so that we can continue to offer Ms. Fiddler the most up to date genetic testing.   Genetic testing did identify a variant of uncertain significance (VUS) was identified in the [facility] gene called c.860G>A.  At this time, it is unknown if this variant is associated with increased cancer risk or if this is a normal finding, but most variants such as this get reclassified to being inconsequential. It should not be used to make medical management decisions. With time, we suspect the lab will determine the significance of this variant, if any. If we do learn more about it, we will try to contact Ms. Trimmer to discuss it further. However, it is important to stay in touch with [place] periodically and keep the address and phone number up to date.  ADDITIONAL GENETIC TESTING: We discussed with Ms. Archibeque that her genetic testing was fairly extensive.  If there are genes identified to increase cancer risk that can be analyzed in the future, we would be happy to discuss and coordinate this testing at that time.    CANCER SCREENING RECOMMENDATIONS: Ms. Castello test result is considered negative (normal).  This means that we have not identified a hereditary cause for her personal and family history of cancer at this time. Most cancers happen by chance and this negative test suggests that her cancer may fall into this category.    While reassuring, this does not definitively rule out a hereditary predisposition to cancer. It is still possible that there could be genetic mutations that are undetectable by current technology. There could be genetic mutations in genes that have not been tested or identified to increase cancer risk.  Therefore, it is recommended she continue to follow the cancer management and screening  guidelines provided by her oncology and primary healthcare provider.   An individual's cancer risk and medical management are not determined by genetic test results alone. Overall cancer risk assessment incorporates additional factors, including personal medical history, family history, and any available genetic information that may result in a personalized plan for cancer prevention and surveillance  RECOMMENDATIONS FOR FAMILY MEMBERS:  Individuals in this family might be at some increased risk of developing cancer, over the general population risk, simply due to the family history of cancer.  We recommended women in this family have a yearly mammogram beginning at age 64, or 12 years younger than the earliest onset of cancer, an annual clinical breast exam, and perform monthly breast self-exams. Women in this family should also have a gynecological exam as recommended by their primary provider. All family members should have a colonoscopy by age 62.  FOLLOW-UP: Lastly, we discussed with Ms. Laviolette that cancer genetics is a rapidly advancing field and it is possible that new genetic tests will be appropriate for her and/or her family members in the future. We encouraged her to remain in contact with cancer genetics on an annual basis so  we can update her personal and family histories and let her know of advances in cancer genetics that may benefit this family.   Our contact number was provided. Ms. Hott questions were answered to her satisfaction, and she knows she is welcome to call us at anytime with additional questions or concerns.   Faith Rogue, MS Genetic Counselor [email or web address] Phone: [phone]

## 2018-12-13 NOTE — Telephone Encounter (Signed)
Revealed negative genetic testing.  Revealed that a VUS in [facility] was identified. This normal result is reassuring and indicates that it is unlikely Tiffany [name]'s cancer is due to a hereditary cause.  It is unlikely that there is an increased risk of another cancer due to a mutation in one of these genes.  However, genetic testing is not perfect, and cannot definitively rule out a hereditary cause.  It will be important for her to keep in contact with genetics to learn if any additional testing may be needed in the future.

## 2018-12-15 ENCOUNTER — Inpatient Hospital Stay: Payer: Medicare Other

## 2018-12-15 ENCOUNTER — Other Ambulatory Visit: Payer: Self-pay

## 2018-12-15 ENCOUNTER — Inpatient Hospital Stay (HOSPITAL_BASED_OUTPATIENT_CLINIC_OR_DEPARTMENT_OTHER): Payer: Medicare Other | Admitting: Oncology

## 2018-12-15 VITALS — BP 137/69 | HR 91 | Temp 97.9°F | Resp 18 | Ht 63.0 in | Wt 137.4 lb

## 2018-12-15 DIAGNOSIS — C786 Secondary malignant neoplasm of retroperitoneum and peritoneum: Secondary | ICD-10-CM

## 2018-12-15 DIAGNOSIS — Z7189 Other specified counseling: Secondary | ICD-10-CM

## 2018-12-15 DIAGNOSIS — Z808 Family history of malignant neoplasm of other organs or systems: Secondary | ICD-10-CM | POA: Diagnosis not present

## 2018-12-15 DIAGNOSIS — C251 Malignant neoplasm of body of pancreas: Secondary | ICD-10-CM

## 2018-12-15 DIAGNOSIS — C7800 Secondary malignant neoplasm of unspecified lung: Secondary | ICD-10-CM

## 2018-12-15 DIAGNOSIS — Z803 Family history of malignant neoplasm of breast: Secondary | ICD-10-CM | POA: Diagnosis not present

## 2018-12-15 DIAGNOSIS — Z79899 Other long term (current) drug therapy: Secondary | ICD-10-CM | POA: Diagnosis not present

## 2018-12-15 DIAGNOSIS — Z800 Family history of malignant neoplasm of digestive organs: Secondary | ICD-10-CM | POA: Diagnosis not present

## 2018-12-15 DIAGNOSIS — C258 Malignant neoplasm of overlapping sites of pancreas: Secondary | ICD-10-CM | POA: Diagnosis not present

## 2018-12-15 DIAGNOSIS — I421 Obstructive hypertrophic cardiomyopathy: Secondary | ICD-10-CM | POA: Diagnosis not present

## 2018-12-15 DIAGNOSIS — I10 Essential (primary) hypertension: Secondary | ICD-10-CM

## 2018-12-15 LAB — CMP (CANCER CENTER ONLY)
ALT: 60 U/L — ABNORMAL HIGH (ref 0–44)
AST: 51 U/L — ABNORMAL HIGH (ref 15–41)
Albumin: 3.6 g/dL (ref 3.5–5.0)
Alkaline Phosphatase: 78 U/L (ref 38–126)
Anion gap: 8 (ref 5–15)
BUN: 13 mg/dL (ref 8–23)
CO2: 29 mmol/L (ref 22–32)
Calcium: 10.6 mg/dL — ABNORMAL HIGH (ref 8.9–10.3)
Chloride: 93 mmol/L — ABNORMAL LOW (ref 98–111)
Creatinine: 0.79 mg/dL (ref 0.44–1.00)
GFR, Est AFR Am: >60 mL/min (ref >60)
GFR, Estimated: >60 mL/min (ref >60)
Glucose, Bld: 118 mg/dL — ABNORMAL HIGH (ref 70–99)
Potassium: 4.2 mmol/L (ref 3.5–5.1)
Sodium: 130 mmol/L — ABNORMAL LOW (ref 135–145)
Total Bilirubin: 0.7 mg/dL (ref 0.3–1.2)
Total Protein: 6.6 g/dL (ref 6.5–8.1)

## 2018-12-15 MED ORDER — LIDOCAINE-PRILOCAINE 2.5-2.5 % EX CREA: 1.0000 "application " | TOPICAL_CREAM | CUTANEOUS | 0 refills | Status: AC | PRN

## 2018-12-15 MED ORDER — PROCHLORPERAZINE MALEATE 5 MG PO TABS: 5.0000 mg | ORAL_TABLET | Freq: Four times a day (QID) | ORAL | 0 refills | Status: DC | PRN

## 2018-12-15 NOTE — Progress Notes (Signed)
[place] OFFICE PROGRESS NOTE   Diagnosis: Pancreas cancer  INTERVAL HISTORY:   Tiffany [name] returns as scheduled.  She underwent a CT-guided biopsy of an omental mass on 12/12/2018.  The pathology confirmed metastatic pancreas cancer. She complains of anorexia.  No nausea or abdominal pain.  She is constipated.  Her chief complaint is a cough.  No fever or dyspnea.  Objective:  Vital signs in last 24 hours:  Blood pressure 137/69, pulse 91, temperature 97.9 F (36.6 C), temperature source Oral, resp. rate 18, height 5\' 3"  (1.6 m), weight 137 lb 6.4 oz (62.3 kg), SpO2 97 %.   Physical examination: Not performed today  Lab Results:  Lab Results  Component Value Date   WBC 5.2 12/12/2018   HGB 12.9 12/12/2018   HCT 38.9 12/12/2018   MCV 85.1 12/12/2018   PLT 261 12/12/2018   NEUTROABS 4.3 12/05/2018    CMP  Lab Results  Component Value Date   NA 131 (L) 12/05/2018   K 4.2 12/05/2018   CL 94 (L) 12/05/2018   CO2 27 12/05/2018   GLUCOSE 110 (H) 12/05/2018   BUN 11 12/05/2018   CREATININE 0.88 12/05/2018   CALCIUM 10.6 (H) 12/05/2018   PROT 6.3 (L) 12/05/2018   ALBUMIN 3.6 12/05/2018   AST 55 (H) 12/05/2018   ALT 65 (H) 12/05/2018   ALKPHOS 69 12/05/2018   BILITOT 0.7 12/05/2018   GFRNONAA 58 (L) 12/05/2018   GFRAA >60 12/05/2018      Imaging:  Ct Biopsy  Result Date: 12/12/2018 INDICATION: Concern for metastatic pancreatic cancer. Please perform omental mass biopsy for tissue diagnostic purposes. EXAM: CT-GUIDED BIOPSY OMENTAL THICKENING WITHIN THE VENTRAL ASPECT OF THE LEFT MID ABDOMEN COMPARISON:  CT the chest abdomen and pelvis-12/06/2018; abdominal MRI-11/25/2018 MEDICATIONS: None. ANESTHESIA/SEDATION: Fentanyl 25 mcg IV; Versed 1 mg IV Sedation time: 15 minutes; The patient was continuously monitored during the procedure by the interventional radiology nurse under my direct supervision. CONTRAST:  None. COMPLICATIONS: None immediate.  PROCEDURE: Informed consent was obtained from the patient following an explanation of the procedure, risks, benefits and alternatives. A time out was performed prior to the initiation of the procedure. The patient was positioned supine on the CT table and a limited CT was performed for procedural planning demonstrating unchanged size and appearance dominant approximately 3.8 x 2.3 cm omental nodule within ventral aspect of the left mid abdomen (image 31, series 2). The procedure was planned. The operative site was prepped and draped in the usual sterile fashion. Appropriate trajectory was confirmed with a 22 gauge spinal needle after the adjacent tissues were anesthetized with 1% Lidocaine with epinephrine. Under intermittent CT guidance, a 17 gauge coaxial needle was advanced into the peripheral aspect of the mass. Appropriate positioning was confirmed and 6 core needle biopsy samples were obtained with an 18 gauge core needle biopsy device. The co-axial needle was removed and hemostasis was achieved with manual compression. A limited postprocedural CT was negative for hemorrhage or additional complication. A dressing was placed. The patient tolerated the procedure well without immediate postprocedural complication. IMPRESSION: Technically successful CT guided core needle biopsy of dominant omental thickening within the ventral aspect of the left mid abdomen. Electronically Signed   By: Sandi Mariscal M.D.   On: 12/12/2018 13:13    Medications: I have reviewed the patient's current medications.   Assessment/Plan: 1. Pancreas mass/lung nodules/peritoneal nodules  Abdominal ultrasound 11/25/2018-1.0 cm pancreatic head cyst and a 5.5 mm lymph node adjacent to  the pancreatic head.    MRI abdomen 11/25/2018-irregular poorly marginated hypoenhancing 3.2 x 2.2 cm posterior pancreatic body mass encasing the celiac artery; separate 1.4 cm pancreatic neck and 1.2 cm posterior pancreatic head cystic masses; multiple lung  masses/pulmonary nodules scattered throughout both lung bases measuring up to 3.5 cm; extensive peritoneal carcinomatosis with soft tissue metastases throughout the omentum and perihepatic space; trace ascites.  CTs 12/06/2018- large left thyroid nodule, bilateral pulmonary nodules, pancreas body/tail mass with involvement of the celiac axis, peritoneal nodularity, omental caking at the anterior abdominal wall  CT biopsy of an omental nodule 12/12/2018- metastatic adenocarcinoma consistent with pancreas cancer  Common hereditary cancer panel-negative 2. Abdominal pain secondary to #1 3. Hypertension 4. Hypertrophic obstructive cardiomyopathy 5. Cough-likely secondary to pulmonary metastases    Disposition: Tiffany [name] has been diagnosed with metastatic pancreas cancer.  I discussed the prognosis and treatment options with Tiffany [name].  Her daughter was on the telephone throughout the visit today.  She understands no therapy will be curative.  We discussed supportive care versus a trial of systemic therapy.  She understands the goal of chemotherapy is palliation of her symptoms and potentially prolongation of survival.  She is in good health for her age and maintains an adequate performance status.  We discussed single agent gemcitabine and gemcitabine/Abraxane.  Tiffany [name] made it clear that she would like to proceed with a trial of chemotherapy.  I recommend gemcitabine/Abraxane to be given on a 2-week schedule.  We reviewed potential toxicities associated with this regimen including the chance for nausea, alopecia, infection/bleeding, and hematologic toxicity.  We discussed the rash, pneumonitis, and fever associated with gemcitabine.  We reviewed the allergic reaction and neuropathy seen with Abraxane.  She agrees to proceed.  She will attend a chemotherapy teaching class today.  Tiffany [name] will be referred for Port-A-Cath placement with the plan to begin chemotherapy on 12/22/2018.  The cough is  likely secondary to pulmonary metastases.  The calcium level was mildly elevated on 12/05/2018.  We will repeat a chemistry panel when she is here for chemotherapy on 12/22/2018.  Her case was presented at the GI tumor conference on 12/14/2018.  I reviewed the CT images.  A chemotherapy plan was entered today.  40 minutes were spent with the patient today.  The majority of the time was used for counseling and coordination of care.  Betsy Coder, MD  12/15/2018  11:53 AM

## 2018-12-15 NOTE — Progress Notes (Signed)
START ON PATHWAY REGIMEN - Pancreatic Adenocarcinoma     A cycle is every 28 days:     Nab-paclitaxel (protein bound)      Gemcitabine   **Always confirm dose/schedule in your pharmacy ordering system**  Patient Characteristics: Metastatic Disease, First Line, PS = 0,1, BRCA1/2 and PALB2  Mutation Absent/Unknown Current evidence of distant metastases<= Yes AJCC T Category: Staged < 8th Ed. AJCC N Category: Staged < 8th Ed. AJCC M Category: Staged < 8th Ed. AJCC 8 Stage Grouping: Staged < 8th Ed. Line of Therapy: First Engineer, maintenance (IT) Status: 1 BRCA1/2 Mutation Status: Absent PALB2 Mutation Status: Absent Intent of Therapy: Non-Curative / Palliative Intent, Discussed with Patient

## 2018-12-16 ENCOUNTER — Other Ambulatory Visit: Payer: Self-pay | Admitting: Radiology

## 2018-12-19 ENCOUNTER — Other Ambulatory Visit: Payer: Self-pay

## 2018-12-19 ENCOUNTER — Other Ambulatory Visit: Payer: Self-pay | Admitting: Oncology

## 2018-12-19 ENCOUNTER — Encounter (HOSPITAL_COMMUNITY): Payer: Self-pay

## 2018-12-19 ENCOUNTER — Ambulatory Visit (HOSPITAL_COMMUNITY)
Admission: RE | Admit: 2018-12-19 | Discharge: 2018-12-19 | Disposition: A | Discharge status: Home / Self Care | Payer: Medicare Other | Source: Ambulatory Visit | Attending: Oncology | Admitting: Oncology

## 2018-12-19 DIAGNOSIS — I10 Essential (primary) hypertension: Secondary | ICD-10-CM | POA: Diagnosis not present

## 2018-12-19 DIAGNOSIS — I421 Obstructive hypertrophic cardiomyopathy: Secondary | ICD-10-CM | POA: Diagnosis not present

## 2018-12-19 DIAGNOSIS — Z79899 Other long term (current) drug therapy: Secondary | ICD-10-CM | POA: Diagnosis not present

## 2018-12-19 DIAGNOSIS — Z90710 Acquired absence of both cervix and uterus: Secondary | ICD-10-CM | POA: Insufficient documentation

## 2018-12-19 DIAGNOSIS — Z8249 Family history of ischemic heart disease and other diseases of the circulatory system: Secondary | ICD-10-CM | POA: Diagnosis not present

## 2018-12-19 DIAGNOSIS — C251 Malignant neoplasm of body of pancreas: Secondary | ICD-10-CM

## 2018-12-19 DIAGNOSIS — Z803 Family history of malignant neoplasm of breast: Secondary | ICD-10-CM | POA: Diagnosis not present

## 2018-12-19 DIAGNOSIS — M810 Age-related osteoporosis without current pathological fracture: Secondary | ICD-10-CM | POA: Insufficient documentation

## 2018-12-19 DIAGNOSIS — E785 Hyperlipidemia, unspecified: Secondary | ICD-10-CM | POA: Diagnosis not present

## 2018-12-19 DIAGNOSIS — Z800 Family history of malignant neoplasm of digestive organs: Secondary | ICD-10-CM | POA: Insufficient documentation

## 2018-12-19 DIAGNOSIS — C799 Secondary malignant neoplasm of unspecified site: Secondary | ICD-10-CM | POA: Diagnosis not present

## 2018-12-19 DIAGNOSIS — Z808 Family history of malignant neoplasm of other organs or systems: Secondary | ICD-10-CM | POA: Insufficient documentation

## 2018-12-19 DIAGNOSIS — R0609 Other forms of dyspnea: Secondary | ICD-10-CM | POA: Diagnosis not present

## 2018-12-19 DIAGNOSIS — Z452 Encounter for adjustment and management of vascular access device: Secondary | ICD-10-CM | POA: Diagnosis not present

## 2018-12-19 DIAGNOSIS — C259 Malignant neoplasm of pancreas, unspecified: Secondary | ICD-10-CM | POA: Diagnosis not present

## 2018-12-19 HISTORY — PX: IR IMAGING GUIDED PORT INSERTION: IMG5740

## 2018-12-19 LAB — BASIC METABOLIC PANEL
Anion gap: 10 (ref 5–15)
BUN: 18 mg/dL (ref 8–23)
CO2: 25 mmol/L (ref 22–32)
Calcium: 10.3 mg/dL (ref 8.9–10.3)
Chloride: 91 mmol/L — ABNORMAL LOW (ref 98–111)
Creatinine, Ser: 0.78 mg/dL (ref 0.44–1.00)
GFR calc non Af Amer: >60 mL/min (ref >60)
Glucose, Bld: 123 mg/dL — ABNORMAL HIGH (ref 70–99)
Potassium: 3.4 mmol/L — ABNORMAL LOW (ref 3.5–5.1)
Sodium: 126 mmol/L — ABNORMAL LOW (ref 135–145)

## 2018-12-19 LAB — CBC WITH DIFFERENTIAL/PLATELET
Abs Immature Granulocytes: 0.02 10*3/uL (ref 0.00–0.07)
Basophils Absolute: 0 10*3/uL (ref 0.0–0.1)
Basophils Relative: 1 %
EOS ABS: 0.2 10*3/uL (ref 0.0–0.5)
EOS PCT: 2 %
HEMATOCRIT: 36.8 % (ref 36.0–46.0)
Hemoglobin: 12.7 g/dL (ref 12.0–15.0)
Immature Granulocytes: 0 %
LYMPHS PCT: 7 %
Lymphs Abs: 0.5 10*3/uL — ABNORMAL LOW (ref 0.7–4.0)
MCH: 29.5 pg (ref 26.0–34.0)
MCHC: 34.5 g/dL (ref 30.0–36.0)
MCV: 85.4 fL (ref 80.0–100.0)
Monocytes Absolute: 0.7 10*3/uL (ref 0.1–1.0)
Monocytes Relative: 12 %
Neutro Abs: 4.9 10*3/uL (ref 1.7–7.7)
Neutrophils Relative %: 78 %
Platelets: 249 10*3/uL (ref 150–400)
RBC: 4.31 MIL/uL (ref 3.87–5.11)
RDW: 12.7 % (ref 11.5–15.5)
WBC: 6.3 10*3/uL (ref 4.0–10.5)
nRBC: 0 % (ref 0.0–0.2)

## 2018-12-19 LAB — PROTIME-INR
INR: 1 (ref 0.8–1.2)
Prothrombin Time: 12.7 seconds (ref 11.4–15.2)

## 2018-12-19 MED ORDER — MIDAZOLAM HCL 2 MG/2ML IJ SOLN
INTRAMUSCULAR | Status: AC | PRN
  Administered 2018-12-19 (×2): 0.5 mg via INTRAVENOUS

## 2018-12-19 MED ORDER — HEPARIN SOD (PORK) LOCK FLUSH 100 UNIT/ML IV SOLN
INTRAVENOUS | Status: AC
  Filled 2018-12-19: qty 5

## 2018-12-19 MED ORDER — FENTANYL CITRATE (PF) 100 MCG/2ML IJ SOLN
INTRAMUSCULAR | Status: AC
  Filled 2018-12-19: qty 2

## 2018-12-19 MED ORDER — ONDANSETRON HCL 4 MG/2ML IJ SOLN
INTRAMUSCULAR | Status: AC | PRN
  Administered 2018-12-19: 4 mg via INTRAVENOUS

## 2018-12-19 MED ORDER — LIDOCAINE HCL 1 % IJ SOLN
INTRAMUSCULAR | Status: AC | PRN
  Administered 2018-12-19: 5 mL

## 2018-12-19 MED ORDER — SODIUM CHLORIDE 0.9 % IV SOLN
INTRAVENOUS | Status: DC
  Administered 2018-12-19: 09:00:00 via INTRAVENOUS

## 2018-12-19 MED ORDER — LIDOCAINE HCL 1 % IJ SOLN
INTRAMUSCULAR | Status: AC
  Filled 2018-12-19: qty 20

## 2018-12-19 MED ORDER — HEPARIN SOD (PORK) LOCK FLUSH 100 UNIT/ML IV SOLN
INTRAVENOUS | Status: AC | PRN
  Administered 2018-12-19: 500 [IU] via INTRAVENOUS

## 2018-12-19 MED ORDER — FENTANYL CITRATE (PF) 100 MCG/2ML IJ SOLN
INTRAMUSCULAR | Status: AC | PRN
  Administered 2018-12-19 (×2): 25 ug via INTRAVENOUS

## 2018-12-19 MED ORDER — CEFAZOLIN SODIUM-DEXTROSE 2-4 GM/100ML-% IV SOLN
2.0000 g | INTRAVENOUS | Status: AC
  Administered 2018-12-19: 2 g via INTRAVENOUS

## 2018-12-19 MED ORDER — ONDANSETRON HCL 4 MG/2ML IJ SOLN
INTRAMUSCULAR | Status: AC
  Filled 2018-12-19: qty 2

## 2018-12-19 MED ORDER — CEFAZOLIN SODIUM-DEXTROSE 2-4 GM/100ML-% IV SOLN
INTRAVENOUS | Status: AC
  Administered 2018-12-19: 2 g via INTRAVENOUS
  Filled 2018-12-19: qty 100

## 2018-12-19 MED ORDER — MIDAZOLAM HCL 2 MG/2ML IJ SOLN
INTRAMUSCULAR | Status: AC
  Filled 2018-12-19: qty 2

## 2018-12-19 NOTE — Procedures (Signed)
Interventional Radiology Procedure Note  Procedure: Placement of a right IJ approach single lumen PowerPort.  Tip is positioned at the superior cavoatrial junction and catheter is ready for immediate use.  Complications: None Recommendations:  - Ok to shower tomorrow - Do not submerge for 7 days - Routine line care   Signed,  Kelsye Loomer S. Adith Tejada, DO   

## 2018-12-19 NOTE — Discharge Instructions (Signed)
You may shower and remove your dressing tomorrow. ° °DO NOT use EMLA cream for 2 weeks after port placement as this cream will remove surgical glue on your incision. ° °Implanted Port Home Guide °An implanted port is a device that is placed under the skin. It is usually placed in the chest. The device can be used to give IV medicine, to take blood, or for dialysis. You may have an implanted port if: °· You need IV medicine that would be irritating to the small veins in your hands or arms. °· You need IV medicines, such as antibiotics, for a long period of time. °· You need IV nutrition for a long period of time. °· You need dialysis. °Having a port means that your health care provider will not need to use the veins in your arms for these procedures. You may have fewer limitations when using a port than you would if you used other types of long-term IVs, and you will likely be able to return to normal activities after your incision heals. °An implanted port has two main parts: °· Reservoir. The reservoir is the part where a needle is inserted to give medicines or draw blood. The reservoir is round. After it is placed, it appears as a small, raised area under your skin. °· Catheter. The catheter is a thin, flexible tube that connects the reservoir to a vein. Medicine that is inserted into the reservoir goes into the catheter and then into the vein. °How is my port accessed? °To access your port: °· A numbing cream may be placed on the skin over the port site. °· Your health care provider will put on a mask and sterile gloves. °· The skin over your port will be cleaned carefully with a germ-killing soap and allowed to dry. °· Your health care provider will gently pinch the port and insert a needle into it. °· Your health care provider will check for a blood return to make sure the port is in the vein and is not clogged. °· If your port needs to remain accessed to get medicine continuously (constant infusion), your  health care provider will place a clear bandage (dressing) over the needle site. The dressing and needle will need to be changed every week, or as told by your health care provider. °What is flushing? °Flushing helps keep the port from getting clogged. Follow instructions from your health care provider about how and when to flush the port. Ports are usually flushed with saline solution or a medicine called heparin. The need for flushing will depend on how the port is used: °· If the port is only used from time to time to give medicines or draw blood, the port may need to be flushed: °? Before and after medicines have been given. °? Before and after blood has been drawn. °? As part of routine maintenance. Flushing may be recommended every 4-6 weeks. °· If a constant infusion is running, the port may not need to be flushed. °· Throw away any syringes in a disposal container that is meant for sharp items (sharps container). You can buy a sharps container from a pharmacy, or you can make one by using an empty hard plastic bottle with a cover. °How long will my port stay implanted? °The port can stay in for as long as your health care provider thinks it is needed. When it is time for the port to come out, a surgery will be done to remove it. The surgery   will be similar to the procedure that was done to put the port in. °Follow these instructions at home: ° °· Flush your port as told by your health care provider. °· If you need an infusion over several days, follow instructions from your health care provider about how to take care of your port site. Make sure you: °? Wash your hands with soap and water before you change your dressing. If soap and water are not available, use alcohol-based hand sanitizer. °? Change your dressing as told by your health care provider. °? Place any used dressings or infusion bags into a plastic bag. Throw that bag in the trash. °? Keep the dressing that covers the needle clean and dry. Do not  get it wet. °? Do not use scissors or sharp objects near the tube. °? Keep the tube clamped, unless it is being used. °· Check your port site every day for signs of infection. Check for: °? Redness, swelling, or pain. °? Fluid or blood. °? Pus or a bad smell. °· Protect the skin around the port site. °? Avoid wearing bra straps that rub or irritate the site. °? Protect the skin around your port from seat belts. Place a soft pad over your chest if needed. °· Bathe or shower as told by your health care provider. The site may get wet as long as you are not actively receiving an infusion. °· Return to your normal activities as told by your health care provider. Ask your health care provider what activities are safe for you. °· Carry a medical alert card or wear a medical alert bracelet at all times. This will let health care providers know that you have an implanted port in case of an emergency. °Get help right away if: °· You have redness, swelling, or pain at the port site. °· You have fluid or blood coming from your port site. °· You have pus or a bad smell coming from the port site. °· You have a fever. °Summary °· Implanted ports are usually placed in the chest for long-term IV access. °· Follow instructions from your health care provider about flushing the port and changing bandages (dressings). °· Take care of the area around your port by avoiding clothing that puts pressure on the area, and by watching for signs of infection. °· Protect the skin around your port from seat belts. Place a soft pad over your chest if needed. °· Get help right away if you have a fever or you have redness, swelling, pain, drainage, or a bad smell at the port site. °This information is not intended to replace advice given to you by your health care provider. Make sure you discuss any questions you have with your health care provider. °Document Released: 09/07/2005 Document Revised: 10/10/2016 Document Reviewed: 10/10/2016 °[employer]  Interactive Patient Education © 2019 [employer] ° ° °Moderate Conscious Sedation, Adult, Care After °These instructions provide you with information about caring for yourself after your procedure. Your health care provider may also give you more specific instructions. Your treatment has been planned according to current medical practices, but problems sometimes occur. Call your health care provider if you have any problems or questions after your procedure. °What can I expect after the procedure? °After your procedure, it is common: °· To feel sleepy for several hours. °· To feel clumsy and have poor balance for several hours. °· To have poor judgment for several hours. °· To vomit if you eat too soon. °Follow these   instructions at home: °For at least 24 hours after the procedure: ° °· Do not: °? Participate in activities where you could fall or become injured. °? Drive. °? Use heavy machinery. °? Drink alcohol. °? Take sleeping pills or medicines that cause drowsiness. °? Make important decisions or sign legal documents. °? Take care of children on your own. °· Rest. °Eating and drinking °· Follow the diet recommended by your health care provider. °· If you vomit: °? Drink water, juice, or soup when you can drink without vomiting. °? Make sure you have little or no nausea before eating solid foods. °General instructions °· Have a responsible adult stay with you until you are awake and alert. °· Take over-the-counter and prescription medicines only as told by your health care provider. °· If you smoke, do not smoke without supervision. °· Keep all follow-up visits as told by your health care provider. This is important. °Contact a health care provider if: °· You keep feeling nauseous or you keep vomiting. °· You feel light-headed. °· You develop a rash. °· You have a fever. °Get help right away if: °· You have trouble breathing. °This information is not intended to replace advice given to you by your health care  provider. Make sure you discuss any questions you have with your health care provider. °Document Released: 06/28/2013 Document Revised: 02/10/2016 Document Reviewed: 12/28/2015 °[employer] Interactive Patient Education © 2019 [employer] ° °

## 2018-12-19 NOTE — H&P (Signed)
Chief Complaint: Patient was seen in consultation today for port placement.  Referring Physician(s): Ladell Pier  Supervising Physician: Corrie Mckusick  Patient Status: [facility] - Out-pt  History of Present Illness: Tiffany [name] is a 85 y.o. female with a past medical history significant for cataracts, hearing loss, OA, osteoporosis, hypertrophic obstructive cardiomyopathy, aortic stenosis, HTN, HLD and recently diagnosed metastatic pancreatic cancer followed by Dr. Benay Spice who presents today for port placement. Patient previously seen in IR for omental mass biopsy on 12/12/18 which confirmed metastatic pancreatic cancer. She tells me that she is tentatively planned to begin chemotherapy treatment on Thursday of this week. She denies any current complaints although she does endorse some intermittent nausea and decreased appetite. She states understanding of procedure and wishes to proceed.   Past Medical History:  Diagnosis Date   Aortic stenosis, mild    ECHO 09   Bilateral cataracts    DOE (dyspnea on exertion)    Facial basal cell cancer    nose-/dr, Allyson Sabal   Family history of breast cancer    Family history of colon cancer    Family history of malignant melanoma    Hearing loss in left ear    resolved after stapedectomy   Heart murmur    History of colon polyps    Hyperlipidemia    Hypertension    Hypertrophic obstructive cardiomyopathy (HCC)    Mass of pancreas    OA (osteoarthritis)    Osteoporosis    Retinal vein occlusion    Dr. Starling Manns, Left eye, receives injections   Varicose veins    Vitamin D deficiency     Past Surgical History:  Procedure Laterality Date   COLONOSCOPY  04/2000   COLONOSCOPY W/ POLYPECTOMY  1988   CYST EXCISION N/A 04/07/2018   Procedure: EXCISION EPIDERMAL CYST POSTERIOR NECK;  Surgeon: Armandina Gemma, MD;  Location: [facility];  Service: General;  Laterality: N/A;   ELBOW SURGERY     EXTERNAL  EAR SURGERY Right 1974   Stapedectomy   EYE SURGERY Bilateral    cataract   SHOULDER SURGERY     VAGINAL HYSTERECTOMY     without oopherectomy for birth control age 29    Allergies: Other  Medications: Prior to Admission medications   Medication Sig Start Date End Date Taking? Authorizing Provider  acetaminophen (TYLENOL) 500 MG tablet Take 1,000 mg by mouth every 6 (six) hours as needed.   Yes [provider]  diltiazem (CARDIZEM CD) 180 MG 24 hr capsule Take 1 capsule (180 mg total) by mouth daily. 04/12/18  Yes Belva Crome, MD  lisinopril-hydrochlorothiazide (PRINZIDE,ZESTORETIC) 20-25 MG tablet Take 1 tablet by mouth daily.   Yes [provider]  Misc Natural Products (OSTEO BI-FLEX ADV DOUBLE ST PO) Take 1 tablet by mouth daily.   Yes [provider]  Multiple Vitamins-Minerals (PRESERVISION AREDS) CAPS Take 1 capsule by mouth daily.   Yes [provider]  Polyethylene Glycol 3350 (MIRALAX PO) Take 17 g by mouth daily. 12/15/18  Yes Ladell Pier, MD  Cholecalciferol (VITAMIN D) 2000 units CAPS Take 1 capsule by mouth daily.    [provider]  lidocaine-prilocaine (EMLA) cream Apply 1 application topically as needed. 12/15/18   Ladell Pier, MD  prochlorperazine (COMPAZINE) 5 MG tablet Take 1 tablet (5 mg total) by mouth every 6 (six) hours as needed for nausea or vomiting. 12/15/18   Ladell Pier, MD  traMADol (ULTRAM) 50 MG tablet Take 1  tablet (50 mg total) by mouth every 12 (twelve) hours as needed. Patient not taking: Reported on 12/15/2018 12/02/18   Owens Shark, NP     Family History  Problem Relation Age of Onset   Melanoma Mother        dx 48s, d. 47   Cerebral aneurysm Mother    Hypertension Mother    CAD Father    Breast cancer Sister 69   Colon cancer Brother 4   Hypertension Brother    Hypertension Son    Hypertension Son     Social History   Socioeconomic History   Marital status:  Divorced    Spouse name: Not on file   Number of children: 5   Years of education: college   Highest education level: Not on file  Occupational History   Occupation: retired  Scientist, product/process development strain: Not on file   Food insecurity:    Worry: Not on file    Inability: Not on Lexicographer needs:    Medical: Not on file    Non-medical: Not on file  Tobacco Use   Smoking status: Never Smoker   Smokeless tobacco: Never Used  Substance and Sexual Activity   Alcohol use: Yes    Comment: occ   Drug use: No   Sexual activity: Not on file  Lifestyle   Physical activity:    Days per week: Not on file    Minutes per session: Not on file   Stress: Not on file  Relationships   Social connections:    Talks on phone: Not on file    Gets together: Not on file    Attends religious service: Not on file    Active member of club or organization: Not on file    Attends meetings of clubs or organizations: Not on file    Relationship status: Not on file  Other Topics Concern   Not on file  Social History Narrative   Not on file     Review of Systems: A 12 point ROS discussed and pertinent positives are indicated in the HPI above.  All other systems are negative.  Review of Systems  Constitutional: Positive for appetite change. Negative for chills and fever.  HENT: Negative for trouble swallowing.   Respiratory: Negative for cough and shortness of breath.   Cardiovascular: Negative for chest pain.  Gastrointestinal: Positive for constipation (none currently), diarrhea (none currently) and nausea (intermittent; none currently). Negative for abdominal pain, blood in stool and vomiting.  Genitourinary: Negative for dysuria and hematuria.  Musculoskeletal: Negative for back pain.  Skin: Negative for color change.  Neurological: Negative for dizziness, syncope and headaches.    Vital Signs: LMP  (Approximate)   Physical Exam Vitals signs  reviewed.  Constitutional:      General: She is not in acute distress. HENT:     Head: Normocephalic.  Cardiovascular:     Rate and Rhythm: Normal rate and regular rhythm.     Heart sounds: Murmur present.  Pulmonary:     Effort: Pulmonary effort is normal.     Breath sounds: Normal breath sounds.  Abdominal:     General: There is no distension.     Palpations: Abdomen is soft.     Tenderness: There is no abdominal tenderness.  Skin:    General: Skin is warm and dry.  Neurological:     Mental Status: She is alert and oriented to person, place, and time.  Psychiatric:        Mood and Affect: Mood normal.        Behavior: Behavior normal.        Thought Content: Thought content normal.        Judgment: Judgment normal.      MD Evaluation Airway: WNL Heart: WNL Abdomen: WNL Chest/ Lungs: WNL ASA  Classification: 3 Mallampati/Airway Score: Two   Imaging: Ct Chest W Contrast  Result Date: 12/06/2018 CLINICAL DATA:  Pancreatic cancer, cough, weight loss EXAM: CT CHEST, ABDOMEN, AND PELVIS WITH CONTRAST TECHNIQUE: Multidetector CT imaging of the chest, abdomen and pelvis was performed following the standard protocol during bolus administration of intravenous contrast. CONTRAST:  174mL OMNIPAQUE IOHEXOL 300 MG/ML  SOLN COMPARISON:  MRI abdomen dated 11/25/2018 FINDINGS: CT CHEST FINDINGS Cardiovascular: The heart is normal in size. No pericardial effusion. No evidence thoracic aortic aneurysm. Atherosclerotic calcifications of the aortic arch. Coronary atherosclerosis the LAD and right coronary artery. Mediastinum/Nodes: No suspicious mediastinal, hilar, or axillary lymphadenopathy. Large left thyroid nodule measuring at least 8.1 cm (coronal image 45). Lungs/Pleura: Numerous bilateral pulmonary nodules/masses, highly suspicious for metastatic disease in this patient. Dominant/index lesions include: --1.9 cm nodule in the posterior left upper lobe (series 12/image 68) --2.2 cm nodule  in the posterior right upper lobe (series 12/image 76) --3.0 cm mass in the posterior right lower lobe (series 12/image 116) --3.5 cm mass in the lateral left lower lobe (series 12/image 137) Biapical pleural-parenchymal scarring. No focal consolidation. No pleural effusion or pneumothorax. Musculoskeletal: Mild degenerative changes of the thoracic spine. CT ABDOMEN PELVIS FINDINGS Hepatobiliary: Liver is within normal limits. Gallbladder is unremarkable. No intrahepatic or extrahepatic ductal dilatation. Pancreas: 2.1 x 3.3 cm mass along the posterior aspect of the pancreatic body/tail (series 2/image 38), corresponding to the patient's known pancreatic cancer. Associated involvement of the celiac axis. No pancreatic ductal dilatation or atrophy. Additional 13 mm fluid density lesion in the pancreatic head. Spleen: Within normal limits. Adrenals/Urinary Tract: 1.8 cm right adrenal nodule, characterized as a benign adrenal adenoma on MR. Left adrenal gland is within normal limits. Kidneys are within normal limits.  No hydronephrosis. Bladder is underdistended but unremarkable. Stomach/Bowel: Stomach is within normal limits. No evidence of bowel obstruction. Normal appendix (series 7/image 161). Sigmoid diverticulosis, without evidence of diverticulitis. Vascular/Lymphatic: No evidence of abdominal aortic aneurysm. Atherosclerotic calcifications of the abdominal aorta and branch vessels. Involvement of the celiac axis, as described above. Occlusion of the splenic vein. Dilated thoracic duct in the right retrocrural space (series 7/image 92). No suspicious abdominopelvic lymphadenopathy. Reproductive: Status post hysterectomy. Bilateral ovaries are unremarkable. Other: Small volume pelvic ascites, with peritoneal thickening/nodularity, malignant. Associated peritoneal disease/omental caking beneath the anterior abdominal wall (for example, series 7/image 136). Musculoskeletal: Moderate compression fracture deformity  at L1. No retropulsion. IMPRESSION: 3.3 cm mass along the posterior aspect of the pancreatic body/tail, corresponding to the patient's known pancreatic cancer. Associated involvement of the celiac axis and occlusion of the splenic vein. Associated peritoneal disease/omental caking, as above. Trace pelvic ascites, malignant. Widespread/numerous pulmonary metastases, with index lesions as above. Large left thyroid mass, measuring 8.1 cm, of questionable clinical significance given the additional findings. Electronically Signed   By: Julian Hy M.D.   On: 12/06/2018 10:06   US Abdomen Complete  Result Date: 11/25/2018 CLINICAL DATA:  RIGHT UPPER QUADRANT pain for 3 weeks. EXAM: ABDOMEN ULTRASOUND COMPLETE COMPARISON:  None. FINDINGS: Gallbladder: Gallbladder has a normal appearance. Gallbladder wall is 1.4 millimeters, within normal limits. No  stones or pericholecystic fluid. No sonographic Murphy's sign. Common bile duct: Diameter: 2.5 millimeters Liver: Normal echotexture. A small cyst is identified in the caudate lobe measuring 0.7 x 0.6 x 0.7 centimeters. Portal vein is patent on color Doppler imaging with normal direction of blood flow towards the liver. IVC: No abnormality visualized. Pancreas: A 1.0 x 1.0 x 0.9 centimeters cyst is identified in the pancreatic head region. Spleen: Size and appearance within normal limits. Right Kidney: Length: 11.3 centimeters. No mass or hydronephrosis visualized. Left Kidney: Length: 10.3 centimeters. Possible parapelvic cysts versus focal dilatation LOWER pole collecting system. No solid mass. Abdominal aorta: 2.1 centimeters. There is atherosclerosis of the abdominal aorta. Other findings: Superior to the pancreas, a probable lymph node is 5.5 millimeters. IMPRESSION: 1. No evidence for acute cholecystitis. 2. Caudate lobe cyst. 3. 1.0 centimeter pancreatic head cyst. No other pancreatic lesions are identified. 5.5 millimeter lymph node adjacent to the pancreatic  head. Recommend further evaluation with MR/MRCP. If patient is not a candidate for MRI, recommend CT of the abdomen and pelvis with contrast for evaluation of the pancreas. 4. These results will be called to the ordering clinician or representative by the Radiologist Assistant, and communication documented in the PACS or zVision Dashboard. Electronically Signed   By: Nolon Nations M.D.   On: 11/25/2018 08:36   Ct Abdomen Pelvis W Contrast  Result Date: 12/06/2018 CLINICAL DATA:  Pancreatic cancer, cough, weight loss EXAM: CT CHEST, ABDOMEN, AND PELVIS WITH CONTRAST TECHNIQUE: Multidetector CT imaging of the chest, abdomen and pelvis was performed following the standard protocol during bolus administration of intravenous contrast. CONTRAST:  112mL OMNIPAQUE IOHEXOL 300 MG/ML  SOLN COMPARISON:  MRI abdomen dated 11/25/2018 FINDINGS: CT CHEST FINDINGS Cardiovascular: The heart is normal in size. No pericardial effusion. No evidence thoracic aortic aneurysm. Atherosclerotic calcifications of the aortic arch. Coronary atherosclerosis the LAD and right coronary artery. Mediastinum/Nodes: No suspicious mediastinal, hilar, or axillary lymphadenopathy. Large left thyroid nodule measuring at least 8.1 cm (coronal image 45). Lungs/Pleura: Numerous bilateral pulmonary nodules/masses, highly suspicious for metastatic disease in this patient. Dominant/index lesions include: --1.9 cm nodule in the posterior left upper lobe (series 12/image 68) --2.2 cm nodule in the posterior right upper lobe (series 12/image 76) --3.0 cm mass in the posterior right lower lobe (series 12/image 116) --3.5 cm mass in the lateral left lower lobe (series 12/image 137) Biapical pleural-parenchymal scarring. No focal consolidation. No pleural effusion or pneumothorax. Musculoskeletal: Mild degenerative changes of the thoracic spine. CT ABDOMEN PELVIS FINDINGS Hepatobiliary: Liver is within normal limits. Gallbladder is unremarkable. No  intrahepatic or extrahepatic ductal dilatation. Pancreas: 2.1 x 3.3 cm mass along the posterior aspect of the pancreatic body/tail (series 2/image 38), corresponding to the patient's known pancreatic cancer. Associated involvement of the celiac axis. No pancreatic ductal dilatation or atrophy. Additional 13 mm fluid density lesion in the pancreatic head. Spleen: Within normal limits. Adrenals/Urinary Tract: 1.8 cm right adrenal nodule, characterized as a benign adrenal adenoma on MR. Left adrenal gland is within normal limits. Kidneys are within normal limits.  No hydronephrosis. Bladder is underdistended but unremarkable. Stomach/Bowel: Stomach is within normal limits. No evidence of bowel obstruction. Normal appendix (series 7/image 161). Sigmoid diverticulosis, without evidence of diverticulitis. Vascular/Lymphatic: No evidence of abdominal aortic aneurysm. Atherosclerotic calcifications of the abdominal aorta and branch vessels. Involvement of the celiac axis, as described above. Occlusion of the splenic vein. Dilated thoracic duct in the right retrocrural space (series 7/image 92). No suspicious abdominopelvic lymphadenopathy. Reproductive:  Status post hysterectomy. Bilateral ovaries are unremarkable. Other: Small volume pelvic ascites, with peritoneal thickening/nodularity, malignant. Associated peritoneal disease/omental caking beneath the anterior abdominal wall (for example, series 7/image 136). Musculoskeletal: Moderate compression fracture deformity at L1. No retropulsion. IMPRESSION: 3.3 cm mass along the posterior aspect of the pancreatic body/tail, corresponding to the patient's known pancreatic cancer. Associated involvement of the celiac axis and occlusion of the splenic vein. Associated peritoneal disease/omental caking, as above. Trace pelvic ascites, malignant. Widespread/numerous pulmonary metastases, with index lesions as above. Large left thyroid mass, measuring 8.1 cm, of questionable clinical  significance given the additional findings. Electronically Signed   By: Julian Hy M.D.   On: 12/06/2018 10:06   Mr 3d Recon At Scanner  Result Date: 11/25/2018 CLINICAL DATA:  Right upper quadrant abdominal pain. Small pancreatic head cystic lesion on ultrasound. EXAM: MRI ABDOMEN WITHOUT AND WITH CONTRAST (INCLUDING MRCP) TECHNIQUE: Multiplanar multisequence MR imaging of the abdomen was performed both before and after the administration of intravenous contrast. Heavily T2-weighted images of the biliary and pancreatic ducts were obtained, and three-dimensional MRCP images were rendered by post processing. CONTRAST:  7 cc Gadavist IV. COMPARISON:  11/24/2018 abdominal sonogram. FINDINGS: Lower chest: Multiple lung masses and pulmonary nodules are scattered throughout both lung bases measuring up to 3.5 cm at the left lung base (series 8/image 12) and 2.6 cm at the posterior right lung base (series 8/image 7). Hepatobiliary: Normal liver size and configuration. No hepatic steatosis. Simple 0.9 cm caudate lobe cyst. No suspicious liver masses. Normal gallbladder with no cholelithiasis. No biliary ductal dilatation. Common bile duct diameter 3 mm. No choledocholithiasis. Pancreas: There is an irregular poorly marginated hypoenhancing 3.2 x 2.2 cm posterior pancreatic body mass (series 1104/image 54), which encases the celiac artery at its trifurcation. There are separate 1.4 cm pancreatic neck (series 400/image 63) and 1.2 cm posterior pancreatic head (series 400/image 69) cystic masses that communicate with the main pancreatic duct and demonstrate no wall thickening, enhancement or thick internal septations. Main pancreatic duct diameter is 3 mm in the pancreatic head and neck. Abrupt cut off of main pancreatic duct at the level of the pancreatic body mass. No pancreas divisum. Spleen: Normal size. No mass. Adrenals/Urinary Tract: Right adrenal 1.9 cm nodule demonstrates prominent loss of signal intensity  on out of phase chemical shift imaging compatible with a benign adenoma. No left adrenal nodules. No hydronephrosis. Simple parapelvic renal cysts in the left kidney. No suspicious renal masses. Stomach/Bowel: Normal non-distended stomach. Visualized small and large bowel is normal caliber, with no bowel wall thickening. Vascular/Lymphatic: Atherosclerotic nonaneurysmal abdominal aorta. Patent hepatic, portal and renal veins. Splenic vein is occluded at the level of the pancreatic body mass. Patent SMV. No pathologically enlarged lymph nodes in the abdomen. Other: Trace upper abdominal ascites. No focal fluid collections. Widespread soft tissue implants throughout omentum measuring up to 2.3 cm in the left omentum (series 1103/image 80). Numerous small soft tissue implants throughout perihepatic space 5, for example a 1.1 cm right posterior perihepatic nodule (series 1103/image 51). Musculoskeletal: No aggressive appearing focal osseous lesions. IMPRESSION: 1. Locally advanced/inoperable primary pancreatic adenocarcinoma in the pancreatic body, encasing the celiac artery. Occluded splenic vein. 2. Extensive peritoneal carcinomatosis with soft tissue metastases throughout omentum and perihepatic space. Trace malignant ascites. 3. Bulky pulmonary metastases throughout both lung bases. 4. No biliary ductal dilatation. 5. Right adrenal adenoma. 6.  Aortic Atherosclerosis (ICD10-I70.0). Electronically Signed   By: Ilona Sorrel M.D.   On: 11/25/2018 19:49  Ct Biopsy  Result Date: 12/12/2018 INDICATION: Concern for metastatic pancreatic cancer. Please perform omental mass biopsy for tissue diagnostic purposes. EXAM: CT-GUIDED BIOPSY OMENTAL THICKENING WITHIN THE VENTRAL ASPECT OF THE LEFT MID ABDOMEN COMPARISON:  CT the chest abdomen and pelvis-12/06/2018; abdominal MRI-11/25/2018 MEDICATIONS: None. ANESTHESIA/SEDATION: Fentanyl 25 mcg IV; Versed 1 mg IV Sedation time: 15 minutes; The patient was continuously  monitored during the procedure by the interventional radiology nurse under my direct supervision. CONTRAST:  None. COMPLICATIONS: None immediate. PROCEDURE: Informed consent was obtained from the patient following an explanation of the procedure, risks, benefits and alternatives. A time out was performed prior to the initiation of the procedure. The patient was positioned supine on the CT table and a limited CT was performed for procedural planning demonstrating unchanged size and appearance dominant approximately 3.8 x 2.3 cm omental nodule within ventral aspect of the left mid abdomen (image 31, series 2). The procedure was planned. The operative site was prepped and draped in the usual sterile fashion. Appropriate trajectory was confirmed with a 22 gauge spinal needle after the adjacent tissues were anesthetized with 1% Lidocaine with epinephrine. Under intermittent CT guidance, a 17 gauge coaxial needle was advanced into the peripheral aspect of the mass. Appropriate positioning was confirmed and 6 core needle biopsy samples were obtained with an 18 gauge core needle biopsy device. The co-axial needle was removed and hemostasis was achieved with manual compression. A limited postprocedural CT was negative for hemorrhage or additional complication. A dressing was placed. The patient tolerated the procedure well without immediate postprocedural complication. IMPRESSION: Technically successful CT guided core needle biopsy of dominant omental thickening within the ventral aspect of the left mid abdomen. Electronically Signed   By: Sandi Mariscal M.D.   On: 12/12/2018 13:13   Mr Abdomen Mrcp Moise Boring Contast  Result Date: 11/25/2018 CLINICAL DATA:  Right upper quadrant abdominal pain. Small pancreatic head cystic lesion on ultrasound. EXAM: MRI ABDOMEN WITHOUT AND WITH CONTRAST (INCLUDING MRCP) TECHNIQUE: Multiplanar multisequence MR imaging of the abdomen was performed both before and after the administration of  intravenous contrast. Heavily T2-weighted images of the biliary and pancreatic ducts were obtained, and three-dimensional MRCP images were rendered by post processing. CONTRAST:  7 cc Gadavist IV. COMPARISON:  11/24/2018 abdominal sonogram. FINDINGS: Lower chest: Multiple lung masses and pulmonary nodules are scattered throughout both lung bases measuring up to 3.5 cm at the left lung base (series 8/image 12) and 2.6 cm at the posterior right lung base (series 8/image 7). Hepatobiliary: Normal liver size and configuration. No hepatic steatosis. Simple 0.9 cm caudate lobe cyst. No suspicious liver masses. Normal gallbladder with no cholelithiasis. No biliary ductal dilatation. Common bile duct diameter 3 mm. No choledocholithiasis. Pancreas: There is an irregular poorly marginated hypoenhancing 3.2 x 2.2 cm posterior pancreatic body mass (series 1104/image 54), which encases the celiac artery at its trifurcation. There are separate 1.4 cm pancreatic neck (series 400/image 63) and 1.2 cm posterior pancreatic head (series 400/image 69) cystic masses that communicate with the main pancreatic duct and demonstrate no wall thickening, enhancement or thick internal septations. Main pancreatic duct diameter is 3 mm in the pancreatic head and neck. Abrupt cut off of main pancreatic duct at the level of the pancreatic body mass. No pancreas divisum. Spleen: Normal size. No mass. Adrenals/Urinary Tract: Right adrenal 1.9 cm nodule demonstrates prominent loss of signal intensity on out of phase chemical shift imaging compatible with a benign adenoma. No left adrenal nodules. No hydronephrosis. Simple  parapelvic renal cysts in the left kidney. No suspicious renal masses. Stomach/Bowel: Normal non-distended stomach. Visualized small and large bowel is normal caliber, with no bowel wall thickening. Vascular/Lymphatic: Atherosclerotic nonaneurysmal abdominal aorta. Patent hepatic, portal and renal veins. Splenic vein is occluded at  the level of the pancreatic body mass. Patent SMV. No pathologically enlarged lymph nodes in the abdomen. Other: Trace upper abdominal ascites. No focal fluid collections. Widespread soft tissue implants throughout omentum measuring up to 2.3 cm in the left omentum (series 1103/image 80). Numerous small soft tissue implants throughout perihepatic space 5, for example a 1.1 cm right posterior perihepatic nodule (series 1103/image 51). Musculoskeletal: No aggressive appearing focal osseous lesions. IMPRESSION: 1. Locally advanced/inoperable primary pancreatic adenocarcinoma in the pancreatic body, encasing the celiac artery. Occluded splenic vein. 2. Extensive peritoneal carcinomatosis with soft tissue metastases throughout omentum and perihepatic space. Trace malignant ascites. 3. Bulky pulmonary metastases throughout both lung bases. 4. No biliary ductal dilatation. 5. Right adrenal adenoma. 6.  Aortic Atherosclerosis (ICD10-I70.0). Electronically Signed   By: Ilona Sorrel M.D.   On: 11/25/2018 19:49    Labs:  CBC: Recent Labs    11/25/18 1503 12/05/18 1021 12/12/18 0953 12/19/18 0831  WBC 4.8 5.8 5.2 6.3  HGB 12.7 13.1 12.9 12.7  HCT 39.0 38.3 38.9 36.8  PLT 259 238 261 249    COAGS: Recent Labs    12/12/18 0953  INR 1.0    BMP: Recent Labs    04/07/18 0939 11/25/18 1503 12/05/18 1021 12/15/18 1110  NA 138 132* 131* 130*  K 4.1 3.4* 4.2 4.2  CL 102 99 94* 93*  CO2  --  24 27 29   GLUCOSE 103* 101* 110* 118*  BUN 20 15 11 13   CALCIUM  --  9.9 10.6* 10.6*  CREATININE 0.80 0.84 0.88 0.79  GFRNONAA  --  >60 58* >60  GFRAA  --  >60 >60 >60    LIVER FUNCTION TESTS: Recent Labs    11/25/18 1503 12/05/18 1021 12/15/18 1110  BILITOT 0.6 0.7 0.7  AST 23 55* 51*  ALT 21 65* 60*  ALKPHOS 51 69 78  PROT 6.0* 6.3* 6.6  ALBUMIN 3.5 3.6 3.6    TUMOR MARKERS: No results for input(s): AFPTM, CEA, CA199, CHROMGRNA in the last 8760 hours.  Assessment and Plan:  85 y/o F  with recently diagnosed metastatic pancreatic cancer followed by Dr. Benay Spice who presents today for port placement to begin chemotherapy later this week.  Patient has been NPO since 8 pm last night, she does not take blood thinning medications, she normally takes her blood pressure medications in the morning but did not take them this morning. Afebrile, WBC 6.3, hgb 12.7, plt 249, INR 1.0.  Risks and benefits of image guided port-a-catheter placement was discussed with the patient including, but not limited to bleeding, infection, pneumothorax, or fibrin sheath development and need for additional procedures.  All of the patient's questions were answered, patient is agreeable to proceed.  Consent signed and in chart.  Thank you for this interesting consult.  I greatly enjoyed meeting Alisson C Coles and look forward to participating in their care.  A copy of this report was sent to the requesting provider on this date.  Electronically Signed: Joaquim Nam, PA-C 12/19/2018, 8:45 AM   I spent a total of  15 Minutes in face to face in clinical consultation, greater than 50% of which was counseling/coordinating care for port placement.

## 2018-12-22 ENCOUNTER — Inpatient Hospital Stay: Payer: Medicare Other | Attending: Oncology | Admitting: Oncology

## 2018-12-22 ENCOUNTER — Inpatient Hospital Stay: Payer: Medicare Other

## 2018-12-22 ENCOUNTER — Telehealth: Payer: Self-pay | Admitting: Oncology

## 2018-12-22 ENCOUNTER — Other Ambulatory Visit: Payer: Self-pay

## 2018-12-22 VITALS — BP 116/68 | HR 100 | Temp 97.7°F | Resp 18 | Ht 63.0 in | Wt 135.3 lb

## 2018-12-22 DIAGNOSIS — I421 Obstructive hypertrophic cardiomyopathy: Secondary | ICD-10-CM | POA: Diagnosis not present

## 2018-12-22 DIAGNOSIS — K8689 Other specified diseases of pancreas: Secondary | ICD-10-CM

## 2018-12-22 DIAGNOSIS — C786 Secondary malignant neoplasm of retroperitoneum and peritoneum: Secondary | ICD-10-CM | POA: Insufficient documentation

## 2018-12-22 DIAGNOSIS — C251 Malignant neoplasm of body of pancreas: Secondary | ICD-10-CM

## 2018-12-22 DIAGNOSIS — Z79899 Other long term (current) drug therapy: Secondary | ICD-10-CM | POA: Insufficient documentation

## 2018-12-22 DIAGNOSIS — E041 Nontoxic single thyroid nodule: Secondary | ICD-10-CM | POA: Diagnosis not present

## 2018-12-22 DIAGNOSIS — R918 Other nonspecific abnormal finding of lung field: Secondary | ICD-10-CM | POA: Diagnosis not present

## 2018-12-22 DIAGNOSIS — C258 Malignant neoplasm of overlapping sites of pancreas: Secondary | ICD-10-CM | POA: Diagnosis not present

## 2018-12-22 DIAGNOSIS — Z5111 Encounter for antineoplastic chemotherapy: Secondary | ICD-10-CM | POA: Insufficient documentation

## 2018-12-22 DIAGNOSIS — I10 Essential (primary) hypertension: Secondary | ICD-10-CM | POA: Insufficient documentation

## 2018-12-22 LAB — BASIC METABOLIC PANEL - CANCER CENTER ONLY
Anion gap: 12 (ref 5–15)
BUN: 14 mg/dL (ref 8–23)
CO2: 25 mmol/L (ref 22–32)
Calcium: 11 mg/dL — ABNORMAL HIGH (ref 8.9–10.3)
Chloride: 92 mmol/L — ABNORMAL LOW (ref 98–111)
Creatinine: 0.77 mg/dL (ref 0.44–1.00)
GFR, Est AFR Am: >60 mL/min (ref >60)
GFR, Estimated: >60 mL/min (ref >60)
Glucose, Bld: 110 mg/dL — ABNORMAL HIGH (ref 70–99)
Potassium: 4.1 mmol/L (ref 3.5–5.1)
Sodium: 129 mmol/L — ABNORMAL LOW (ref 135–145)

## 2018-12-22 MED ORDER — SODIUM CHLORIDE 0.9 % IV SOLN
Freq: Once | INTRAVENOUS | Status: AC
  Administered 2018-12-22: 10:00:00 via INTRAVENOUS
  Filled 2018-12-22: qty 250

## 2018-12-22 MED ORDER — MORPHINE SULFATE (PF) 4 MG/ML IV SOLN
INTRAVENOUS | Status: AC
  Filled 2018-12-22: qty 1

## 2018-12-22 MED ORDER — HEPARIN SOD (PORK) LOCK FLUSH 100 UNIT/ML IV SOLN
500.0000 [IU] | Freq: Once | INTRAVENOUS | Status: AC | PRN
  Administered 2018-12-22: 500 [IU]
  Filled 2018-12-22: qty 5

## 2018-12-22 MED ORDER — SODIUM CHLORIDE 0.9% FLUSH
10.0000 mL | INTRAVENOUS | Status: DC | PRN
  Administered 2018-12-22: 10 mL
  Filled 2018-12-22: qty 10

## 2018-12-22 MED ORDER — SODIUM CHLORIDE 0.9 % IV SOLN
800.0000 mg/m2 | Freq: Once | INTRAVENOUS | Status: AC
  Administered 2018-12-22: 1330 mg via INTRAVENOUS
  Filled 2018-12-22: qty 34.98

## 2018-12-22 MED ORDER — TRAMADOL HCL 50 MG PO TABS: 50.0000 mg | ORAL_TABLET | Freq: Four times a day (QID) | ORAL | 0 refills | Status: DC | PRN

## 2018-12-22 MED ORDER — MORPHINE SULFATE 4 MG/ML IJ SOLN
2.0000 mg | Freq: Once | INTRAMUSCULAR | Status: AC
  Administered 2018-12-22: 2 mg via INTRAVENOUS
  Filled 2018-12-22: qty 1

## 2018-12-22 MED ORDER — PACLITAXEL PROTEIN-BOUND CHEMO INJECTION 100 MG
100.0000 mg/m2 | Freq: Once | INTRAVENOUS | Status: AC
  Administered 2018-12-22: 175 mg via INTRAVENOUS
  Filled 2018-12-22: qty 35

## 2018-12-22 MED ORDER — TRAMADOL HCL 50 MG PO TABS
50.0000 mg | ORAL_TABLET | Freq: Once | ORAL | Status: AC
  Administered 2018-12-22: 50 mg via ORAL

## 2018-12-22 MED ORDER — PROCHLORPERAZINE MALEATE 10 MG PO TABS
ORAL_TABLET | ORAL | Status: AC
  Filled 2018-12-22: qty 1

## 2018-12-22 MED ORDER — PROCHLORPERAZINE MALEATE 10 MG PO TABS
5.0000 mg | ORAL_TABLET | Freq: Once | ORAL | Status: AC
  Administered 2018-12-22: 5 mg via ORAL

## 2018-12-22 MED ORDER — TRAMADOL HCL 50 MG PO TABS
ORAL_TABLET | ORAL | Status: AC
  Filled 2018-12-22: qty 1

## 2018-12-22 NOTE — Patient Instructions (Addendum)
**Call Dr. Benay Spice if you need more pain medications for home use.  **Nutrition will call on 12/23/18 at 10:30am to speak with patient.    [facility] Discharge Instructions for Patients Receiving Chemotherapy  Today you received the following chemotherapy agents: paclitaxel protein-bound (Abraxane) and gemcitabine (Gemzar).  To help prevent nausea and vomiting after your treatment, we encourage you to take your nausea medication as prescribed by your physician.    If you develop nausea and vomiting that is not controlled by your nausea medication, call the clinic.   BELOW ARE SYMPTOMS THAT SHOULD BE REPORTED IMMEDIATELY:  *FEVER GREATER THAN 100.5 F  *CHILLS WITH OR WITHOUT FEVER  NAUSEA AND VOMITING THAT IS NOT CONTROLLED WITH YOUR NAUSEA MEDICATION  *UNUSUAL SHORTNESS OF BREATH  *UNUSUAL BRUISING OR BLEEDING  TENDERNESS IN MOUTH AND THROAT WITH OR WITHOUT PRESENCE OF ULCERS  *URINARY PROBLEMS  *BOWEL PROBLEMS  UNUSUAL RASH Items with * indicate a potential emergency and should be followed up as soon as possible.  Feel free to call the clinic should you have any questions or concerns. The clinic phone number is [phone].  Please show the [facility] at check-in to the Emergency Department and triage nurse.  Nanoparticle Albumin-Bound Paclitaxel injection What is this medicine? NANOPARTICLE ALBUMIN-BOUND PACLITAXEL (Na no PAHR ti kuhl al BYOO muhn-bound PAK li TAX el) is a chemotherapy drug. It targets fast dividing cells, like cancer cells, and causes these cells to die. This medicine is used to treat advanced breast cancer, lung cancer, and pancreatic cancer. This medicine may be used for other purposes; ask your health care provider or pharmacist if you have questions. COMMON BRAND NAME(S): Abraxane What should I tell my health care provider before I take this medicine? They need to know if you have any of these conditions: -kidney  disease -liver disease -low blood counts, like low white cell, platelet, or red cell counts -lung or breathing disease, like asthma -tingling of the fingers or toes, or other nerve disorder -an unusual or allergic reaction to paclitaxel, albumin, other chemotherapy, other medicines, foods, dyes, or preservatives -pregnant or trying to get pregnant -breast-feeding How should I use this medicine? This drug is given as an infusion into a vein. It is administered in a hospital or clinic by a specially trained health care professional. Talk to your pediatrician regarding the use of this medicine in children. Special care may be needed. Overdosage: If you think you have taken too much of this medicine contact a poison control center or emergency room at once. NOTE: This medicine is only for you. Do not share this medicine with others. What if I miss a dose? It is important not to miss your dose. Call your doctor or health care professional if you are unable to keep an appointment. What may interact with this medicine? This medicine may interact with the following medications: -antiviral medicines for hepatitis, HIV or AIDS -certain antibiotics like erythromycin and clarithromycin -certain medicines for fungal infections like ketoconazole and itraconazole -certain medicines for seizures like carbamazepine, phenobarbital, phenytoin -gemfibrozil -nefazodone -rifampin -St. John's wort This list may not describe all possible interactions. Give your health care provider a list of all the medicines, herbs, non-prescription drugs, or dietary supplements you use. Also tell them if you smoke, drink alcohol, or use illegal drugs. Some items may interact with your medicine. What should I watch for while using this medicine? Your condition will be monitored carefully while you are receiving this  medicine. You will need important blood work done while you are taking this medicine. This medicine can cause  serious allergic reactions. If you experience allergic reactions like skin rash, itching or hives, swelling of the face, lips, or tongue, tell your doctor or health care professional right away. In some cases, you may be given additional medicines to help with side effects. Follow all directions for their use. This drug may make you feel generally unwell. This is not uncommon, as chemotherapy can affect healthy cells as well as cancer cells. Report any side effects. Continue your course of treatment even though you feel ill unless your doctor tells you to stop. Call your doctor or health care professional for advice if you get a fever, chills or sore throat, or other symptoms of a cold or flu. Do not treat yourself. This drug decreases your body's ability to fight infections. Try to avoid being around people who are sick. This medicine may increase your risk to bruise or bleed. Call your doctor or health care professional if you notice any unusual bleeding. Be careful brushing and flossing your teeth or using a toothpick because you may get an infection or bleed more easily. If you have any dental work done, tell your dentist you are receiving this medicine. Avoid taking products that contain aspirin, acetaminophen, ibuprofen, naproxen, or ketoprofen unless instructed by your doctor. These medicines may hide a fever. Do not become pregnant while taking this medicine or for 6 months after stopping it. Women should inform their doctor if they wish to become pregnant or think they might be pregnant. Men should not father a child while taking this medicine or for 3 months after stopping it. There is a potential for serious side effects to an unborn child. Talk to your health care professional or pharmacist for more information. Do not breast-feed an infant while taking this medicine or for 2 weeks after stopping it. This medicine may interfere with the ability to get pregnant or to father a child. You should  talk to your doctor or health care professional if you are concerned about your fertility. What side effects may I notice from receiving this medicine? Side effects that you should report to your doctor or health care professional as soon as possible: -allergic reactions like skin rash, itching or hives, swelling of the face, lips, or tongue -breathing problems -changes in vision -fast, irregular heartbeat -low blood pressure -mouth sores -pain, tingling, numbness in the hands or feet -signs of decreased platelets or bleeding - bruising, pinpoint red spots on the skin, black, tarry stools, blood in the urine -signs of decreased red blood cells - unusually weak or tired, feeling faint or lightheaded, falls -signs of infection - fever or chills, cough, sore throat, pain or difficulty passing urine -signs and symptoms of liver injury like dark yellow or brown urine; general ill feeling or flu-like symptoms; light-colored stools; loss of appetite; nausea; right upper belly pain; unusually weak or tired; yellowing of the eyes or skin -swelling of the ankles, feet, hands -unusually slow heartbeat Side effects that usually do not require medical attention (report to your doctor or health care professional if they continue or are bothersome): -diarrhea -hair loss -loss of appetite -nausea, vomiting -tiredness This list may not describe all possible side effects. Call your doctor for medical advice about side effects. You may report side effects to FDA at 1-800-FDA-1088. Where should I keep my medicine? This drug is given in a hospital or clinic and  will not be stored at home. NOTE: This sheet is a summary. It may not cover all possible information. If you have questions about this medicine, talk to your doctor, pharmacist, or health care provider.  2019 Elsevier/Gold Standard (2017-05-11 13:03:45)  Gemcitabine injection What is this medicine? GEMCITABINE (jem SYE ta been) is a chemotherapy  drug. This medicine is used to treat many types of cancer like breast cancer, lung cancer, pancreatic cancer, and ovarian cancer. This medicine may be used for other purposes; ask your health care provider or pharmacist if you have questions. COMMON BRAND NAME(S): Gemzar, Infugem What should I tell my health care provider before I take this medicine? They need to know if you have any of these conditions: -blood disorders -infection -kidney disease -liver disease -lung or breathing disease, like asthma -recent or ongoing radiation therapy -an unusual or allergic reaction to gemcitabine, other chemotherapy, other medicines, foods, dyes, or preservatives -pregnant or trying to get pregnant -breast-feeding How should I use this medicine? This drug is given as an infusion into a vein. It is administered in a hospital or clinic by a specially trained health care professional. Talk to your pediatrician regarding the use of this medicine in children. Special care may be needed. Overdosage: If you think you have taken too much of this medicine contact a poison control center or emergency room at once. NOTE: This medicine is only for you. Do not share this medicine with others. What if I miss a dose? It is important not to miss your dose. Call your doctor or health care professional if you are unable to keep an appointment. What may interact with this medicine? -medicines to increase blood counts like filgrastim, pegfilgrastim, sargramostim -some other chemotherapy drugs like cisplatin -vaccines Talk to your doctor or health care professional before taking any of these medicines: -acetaminophen -aspirin -ibuprofen -ketoprofen -naproxen This list may not describe all possible interactions. Give your health care provider a list of all the medicines, herbs, non-prescription drugs, or dietary supplements you use. Also tell them if you smoke, drink alcohol, or use illegal drugs. Some items may  interact with your medicine. What should I watch for while using this medicine? Visit your doctor for checks on your progress. This drug may make you feel generally unwell. This is not uncommon, as chemotherapy can affect healthy cells as well as cancer cells. Report any side effects. Continue your course of treatment even though you feel ill unless your doctor tells you to stop. In some cases, you may be given additional medicines to help with side effects. Follow all directions for their use. Call your doctor or health care professional for advice if you get a fever, chills or sore throat, or other symptoms of a cold or flu. Do not treat yourself. This drug decreases your body's ability to fight infections. Try to avoid being around people who are sick. This medicine may increase your risk to bruise or bleed. Call your doctor or health care professional if you notice any unusual bleeding. Be careful brushing and flossing your teeth or using a toothpick because you may get an infection or bleed more easily. If you have any dental work done, tell your dentist you are receiving this medicine. Avoid taking products that contain aspirin, acetaminophen, ibuprofen, naproxen, or ketoprofen unless instructed by your doctor. These medicines may hide a fever. Do not become pregnant while taking this medicine or for 6 months after stopping it. Women should inform their doctor if they wish  to become pregnant or think they might be pregnant. Men should not father a child while taking this medicine and for 3 months after stopping it. There is a potential for serious side effects to an unborn child. Talk to your health care professional or pharmacist for more information. Do not breast-feed an infant while taking this medicine or for at least 1 week after stopping it. Men should inform their doctors if they wish to father a child. This medicine may lower sperm counts. Talk with your doctor or health care professional if  you are concerned about your fertility. What side effects may I notice from receiving this medicine? Side effects that you should report to your doctor or health care professional as soon as possible: -allergic reactions like skin rash, itching or hives, swelling of the face, lips, or tongue -breathing problems -pain, redness, or irritation at site where injected -signs and symptoms of a dangerous change in heartbeat or heart rhythm like chest pain; dizziness; fast or irregular heartbeat; palpitations; feeling faint or lightheaded, falls; breathing problems -signs of decreased platelets or bleeding - bruising, pinpoint red spots on the skin, black, tarry stools, blood in the urine -signs of decreased red blood cells - unusually weak or tired, feeling faint or lightheaded, falls -signs of infection - fever or chills, cough, sore throat, pain or difficulty passing urine -signs and symptoms of kidney injury like trouble passing urine or change in the amount of urine -signs and symptoms of liver injury like dark yellow or brown urine; general ill feeling or flu-like symptoms; light-colored stools; loss of appetite; nausea; right upper belly pain; unusually weak or tired; yellowing of the eyes or skin -swelling of ankles, feet, hands Side effects that usually do not require medical attention (report to your doctor or health care professional if they continue or are bothersome): -constipation -diarrhea -hair loss -loss of appetite -nausea -rash -vomiting This list may not describe all possible side effects. Call your doctor for medical advice about side effects. You may report side effects to FDA at 1-800-FDA-1088. Where should I keep my medicine? This drug is given in a hospital or clinic and will not be stored at home. NOTE: This sheet is a summary. It may not cover all possible information. If you have questions about this medicine, talk to your doctor, pharmacist, or health care provider.   2019 Elsevier/Gold Standard (2017-12-01 18:06:11)

## 2018-12-22 NOTE — Telephone Encounter (Signed)
Per 4/2 los, appts already scheduled.

## 2018-12-22 NOTE — Progress Notes (Signed)
[facility] OFFICE PROGRESS NOTE   Diagnosis: Pancreas cancer  INTERVAL HISTORY:   Tiffany [name] returns for scheduled visit.  She underwent Port-A-Cath placement 12/19/2018.  She complains of abdominal pain.  Tramadol relieves the pain.  She is taking tramadol once or twice daily.  She complains of insomnia. Tiffany [name] has a poor appetite.  She is drinking adequate fluids.  She has constipation.  She has not been taking MiraLAX.  Her daughter reports Tiffany [name] is very weak.  She is ambulatory in the home.  Objective:  Vital signs in last 24 hours:  Blood pressure 116/68, pulse 100, temperature 97.7 F (36.5 C), temperature source Oral, resp. rate 18, height 5\' 3"  (1.6 m), weight 135 lb 4.8 oz (61.4 kg), SpO2 99 %.    HEENT: No thrush, the mucous membranes are moist Resp: Coarse rhonchi at the left posterior base, no respiratory distress Cardio: Regular rate and rhythm GI: Nontender, no mass, no apparent ascites Vascular: No leg edema    Portacath/PICC-with a surrounding ecchymosis  Lab Results:  Lab Results  Component Value Date   WBC 6.3 12/19/2018   HGB 12.7 12/19/2018   HCT 36.8 12/19/2018   MCV 85.4 12/19/2018   PLT 249 12/19/2018   NEUTROABS 4.9 12/19/2018    CMP  Lab Results  Component Value Date   NA 126 (L) 12/19/2018   K 3.4 (L) 12/19/2018   CL 91 (L) 12/19/2018   CO2 25 12/19/2018   GLUCOSE 123 (H) 12/19/2018   BUN 18 12/19/2018   CREATININE 0.78 12/19/2018   CALCIUM 10.3 12/19/2018   PROT 6.6 12/15/2018   ALBUMIN 3.6 12/15/2018   AST 51 (H) 12/15/2018   ALT 60 (H) 12/15/2018   ALKPHOS 78 12/15/2018   BILITOT 0.7 12/15/2018   GFRNONAA >60 12/19/2018   GFRAA >60 12/19/2018      Imaging:  Ir Imaging Guided Port Insertion  Result Date: 12/19/2018 INDICATION: 85 year old female with a history of pancreatic carcinoma EXAM: IMPLANTED PORT A CATH PLACEMENT WITH ULTRASOUND AND FLUOROSCOPIC GUIDANCE MEDICATIONS: 2 g Ancef; The  antibiotic was administered within an appropriate time interval prior to skin puncture. 4 mg Zofran ANESTHESIA/SEDATION: Moderate (conscious) sedation was employed during this procedure. A total of Versed 1.0 mg and Fentanyl 50 mcg was administered intravenously. Moderate Sedation Time: 20 minutes. The patient's level of consciousness and vital signs were monitored continuously by radiology nursing throughout the procedure under my direct supervision. FLUOROSCOPY TIME:  0 minutes, 6 seconds (1 mGy) COMPLICATIONS: None PROCEDURE: The procedure, risks, benefits, and alternatives were explained to the patient. Questions regarding the procedure were encouraged and answered. The patient understands and consents to the procedure. Ultrasound survey was performed with images stored and sent to PACs. The right neck and chest was prepped with chlorhexidine, and draped in the usual sterile fashion using maximum barrier technique (cap and mask, sterile gown, sterile gloves, large sterile sheet, hand hygiene and cutaneous antiseptic). Antibiotic prophylaxis was provided with 2.0g Ancef administered IV one hour prior to skin incision. Local anesthesia was attained by infiltration with 1% lidocaine without epinephrine. Ultrasound demonstrated patency of the right internal jugular vein, and this was documented with an image. Under real-time ultrasound guidance, this vein was accessed with a 21 gauge micropuncture needle and image documentation was performed. A small dermatotomy was made at the access site with an 11 scalpel. A 0.018" wire was advanced into the SVC and used to estimate the length of the internal catheter. The access  needle exchanged for a 104F micropuncture vascular sheath. The 0.018" wire was then removed and a 0.035" wire advanced into the IVC. An appropriate location for the subcutaneous reservoir was selected below the clavicle and an incision was made through the skin and underlying soft tissues. The subcutaneous  tissues were then dissected using a combination of blunt and sharp surgical technique and a pocket was formed. A single lumen power injectable portacatheter was then tunneled through the subcutaneous tissues from the pocket to the dermatotomy and the port reservoir placed within the subcutaneous pocket. The venous access site was then serially dilated and a peel away vascular sheath placed over the wire. The wire was removed and the port catheter advanced into position under fluoroscopic guidance. The catheter tip is positioned in the cavoatrial junction. This was documented with a spot image. The portacatheter was then tested and found to flush and aspirate well. The port was flushed with saline followed by 100 units/mL heparinized saline. The pocket was then closed in two layers using first subdermal inverted interrupted absorbable sutures followed by a running subcuticular suture. The epidermis was then sealed with Dermabond. The dermatotomy at the venous access site was also seal with Dermabond. Patient tolerated the procedure well and remained hemodynamically stable throughout. No complications encountered and no significant blood loss encountered IMPRESSION: Status post right IJ port catheter placement. Catheter ready for use. Signed, Dulcy Fanny. Dellia Nims, RPVI [facility] Electronically Signed   By: Corrie Mckusick D.O.   On: 12/19/2018 10:21    Medications: I have reviewed the patient's current medications.   Assessment/Plan: 1. Pancreas mass/lung nodules/peritoneal nodules  Abdominal ultrasound 11/25/2018-1.0 cm pancreatic head cyst and a 5.5 mm lymph node adjacent to the pancreatic head.    MRI abdomen 11/25/2018-irregular poorly marginated hypoenhancing 3.2 x 2.2 cm posterior pancreatic body mass encasing the celiac artery; separate 1.4 cm pancreatic neck and 1.2 cm posterior pancreatic head cystic masses; multiple lung masses/pulmonary  nodules scattered throughout both lung bases measuring up to 3.5 cm; extensive peritoneal carcinomatosis with soft tissue metastases throughout the omentum and perihepatic space; trace ascites.  CTs 12/06/2018- large left thyroid nodule, bilateral pulmonary nodules, pancreas body/tail mass with involvement of the celiac axis, peritoneal nodularity, omental caking at the anterior abdominal wall  CT biopsy of an omental nodule 12/12/2018- metastatic adenocarcinoma consistent with pancreas cancer  Cycle 1 gemcitabine/Abraxane 12/22/2018  Common hereditary cancer panel-negative 2. Abdominal pain secondary to #1 3. Hypertension 4. Hypertrophic obstructive cardiomyopathy 5. Cough-likely secondary to pulmonary metastases   Disposition: Tiffany [name] has metastatic pancreas cancer.  She will complete cycle 1 gemcitabine/Abraxane today.  She is symptomatic with pain and anorexia.  She will continue tramadol as needed for pain.  She will let us know if the tramadol does not help or if she has to take frequent tramadol.  We will consider adding a long-acting narcotic. Tiffany [name] will attempt to increase her use of nutrition supplements.  We will asked the cancer center nutritionist to contact her. She will begin daily MiraLAX.  Tiffany [name] has lost weight and her blood pressure is not elevated.  She will discontinue the lisinopril/HCTZ.  The sodium was low on 12/19/2018.  We will repeat a chemistry panel today.  Tiffany [name] will return for an office visit and cycle 2 chemotherapy in 2 weeks.  25 minutes were spent with the patient today.  The majority of the time was used for counseling and coordination of care.  Betsy Coder, MD  12/22/2018  9:17 AM

## 2018-12-22 NOTE — Progress Notes (Signed)
1000--pt states she has pain but "I don't know where it is". Rates pain 5/10. Dr. Benay Spice notified, tramadol ordered and given.   1020--pt states she still has pain but cannot state where is it located. Pt moaning and crying audibly. Dr. Benay Spice notified, ordered treatment to be held and morphine ordered and given.   1045--pt daughter called and notified on pt progress.  Will inform her if we will proceed with treatment.   1100--Pt pain has subsided. Pt states she wants to continue with treatment today. Dr. Benay Spice notified and agreed with continuing with treatment. Will continue to monitor pt's pain level.

## 2018-12-22 NOTE — Progress Notes (Signed)
Per Dr. Benay Spice: OK to treat today (1st chemo) based on labs last week. Would like repeat Bmet today (OK to treat before results obtained).

## 2018-12-23 ENCOUNTER — Other Ambulatory Visit: Payer: Self-pay | Admitting: *Deleted

## 2018-12-23 ENCOUNTER — Inpatient Hospital Stay: Payer: Medicare Other | Admitting: Nutrition

## 2018-12-23 ENCOUNTER — Telehealth: Payer: Self-pay | Admitting: Oncology

## 2018-12-23 ENCOUNTER — Telehealth: Payer: Self-pay | Admitting: *Deleted

## 2018-12-23 DIAGNOSIS — C251 Malignant neoplasm of body of pancreas: Secondary | ICD-10-CM

## 2018-12-23 NOTE — Telephone Encounter (Signed)
Called [facility] [name] for chemotherapy F/U with daughter, [name] on call.  Patient says she is doing okay, "just lying around today.  One spell of nausea last night without vomiting."  Denies any new side effects or symptoms.  "Two to three diarrhea stools today.  Took Imodium earlier so it is slowing down now."  Bladder functioning well.  "Sipping a soft drink, nibbling on crackers and peanut butter.  Not eating or drinking a lot but I'll try."  Instructed to drink 64 oz minimum daily or at least the day before, of and after treatment.   Asked about receiving calcium injection tomorrow.  Conveyed Zometa is an IV infusion.  Denies further questions or needs at this time.  Encouraged to call [phone] Mon -Fri 8:00 am - 4:30 pm or anytime as needed for symptoms, changes or event outside office hours.

## 2018-12-23 NOTE — Progress Notes (Signed)
RD working remotely.  85 year old female diagnosed with Metastatic Pancreas Cancer receiving chemotherapy.  PMH includes HTN, Vitamin D deficiency, HLD, OA, Osteoporosis.  Medications include Vitamin D, MVI, Miralax, Compazine.  Labs include Na 126, K 3.3, Glucose 123.  Height: 63 inches. Weight: 135 pounds. UBW: 153.6 pounds. BMI: 23.07.  Spoke with patient and daughter. Patient reports she has a poor appetite.  She had constipation but it is resolved now. She verbalizes a need to increase fluids. Has not tried ONS.  Nutrition Diagnosis: Inadequate oral intake related to metastatic pancreas cancer as evidenced by 12% weight loss in less than 3 months.  Intervention: Educated patient to consume small more frequent meals and snacks with high calories and protein. Reviewed high protein foods. Encouraged patient to take nausea medication as prescribed. Recommend ONS BID. Questions answered. Will email fact sheets.  Monitoring, Evaluation, Goals: Patient will tolerate increased calories and protein to minimize further weight loss.  Next Visit: To be scheduled as needed.

## 2018-12-23 NOTE — Telephone Encounter (Signed)
-----   Message from Teodoro Spray, RN sent at 12/22/2018  2:41 PM EDT ----- Regarding: Dr. Benay Spice, first time chemo followup Dr. Benay Spice first time abraxane/gemzar. Pt tolerated treatment well. Thank you. +

## 2018-12-23 NOTE — Telephone Encounter (Signed)
Scheduled appt per sch message - pt daughter aware of appt on 4/6

## 2018-12-26 ENCOUNTER — Inpatient Hospital Stay: Payer: Medicare Other

## 2018-12-26 ENCOUNTER — Other Ambulatory Visit: Payer: Self-pay

## 2018-12-26 DIAGNOSIS — C251 Malignant neoplasm of body of pancreas: Secondary | ICD-10-CM

## 2018-12-26 DIAGNOSIS — C258 Malignant neoplasm of overlapping sites of pancreas: Secondary | ICD-10-CM | POA: Diagnosis not present

## 2018-12-26 DIAGNOSIS — E041 Nontoxic single thyroid nodule: Secondary | ICD-10-CM | POA: Diagnosis not present

## 2018-12-26 DIAGNOSIS — R918 Other nonspecific abnormal finding of lung field: Secondary | ICD-10-CM | POA: Diagnosis not present

## 2018-12-26 DIAGNOSIS — Z5111 Encounter for antineoplastic chemotherapy: Secondary | ICD-10-CM | POA: Diagnosis not present

## 2018-12-26 DIAGNOSIS — C786 Secondary malignant neoplasm of retroperitoneum and peritoneum: Secondary | ICD-10-CM | POA: Diagnosis not present

## 2018-12-26 MED ORDER — ZOLEDRONIC ACID 4 MG/100ML IV SOLN
4.0000 mg | Freq: Once | INTRAVENOUS | Status: AC
  Administered 2018-12-26: 4 mg via INTRAVENOUS
  Filled 2018-12-26: qty 100

## 2018-12-26 MED ORDER — SODIUM CHLORIDE 0.9 % IV SOLN
750.0000 mL | Freq: Once | INTRAVENOUS | Status: AC
  Administered 2018-12-26: 250 mL via INTRAVENOUS
  Filled 2018-12-26: qty 750

## 2018-12-26 NOTE — Patient Instructions (Signed)

## 2018-12-30 ENCOUNTER — Telehealth: Payer: Self-pay | Admitting: Emergency Medicine

## 2018-12-30 NOTE — Telephone Encounter (Signed)
Following up on phone call from last night to triage line about taking pain pill and starting to feel weak afterwards.  Spoke with daughter Tiffany [name] and patient.  Both report pt feeling much better today, Tiffany [name] states "I think that not eating and taking a whole pain pill was a bit much.  She ate and rested and feels much better today".  Tiffany [name] reports that per [name] RN's advice she will start giving pain medication 0.5 tablet at a time rather than the whole tablet and making sure it is taken with food.  She also reports that they will start a food log in order to make sure her mother is getting the appropriate amount of nutrition/calories.  Confirmed this plan with Tiffany [name] & pt who deny any further questions or concerns at this time.

## 2018-12-30 NOTE — Telephone Encounter (Signed)
error 

## 2019-01-01 ENCOUNTER — Other Ambulatory Visit: Payer: Self-pay | Admitting: Oncology

## 2019-01-02 ENCOUNTER — Telehealth: Payer: Self-pay | Admitting: *Deleted

## 2019-01-02 NOTE — Telephone Encounter (Signed)
Received call from pt's daughter (in-law?). She states her mother had  Her 1st treatment for pancreatic cancer on 12/22/18 and will be coming in on 01/05/19 for her second treatment. Tiffany [name]'s main concern is that her mother is not eating. States she is not hungry and just nibbles on very small amounts of food and drinking small amounts of fluid. Denies nausea/vomiting/diarrhea/constipation/fever. Last BM was 01/01/19. Denies pain. Tiffany [name] states her mother is losing weight and energy, though her mother does get outside to sit in the sun on the front porch.  Tiffany [name] to keep encouraging eating, offering small frequent meals and reinforcing with her mother that in order to help her to get through chemo, she needs to keep eating. Advised her to discuss with provider on her next visit on Thursday. Tiffany [name] will be available on the phone during provider visit as she cannot be with her in person due to covid 19 crisis. Tiffany [name] voiced understanding  And will continue to encourage/support her mother. She knows to call back with any other questions or concerns

## 2019-01-05 ENCOUNTER — Inpatient Hospital Stay: Payer: Medicare Other

## 2019-01-05 ENCOUNTER — Telehealth: Payer: Self-pay | Admitting: Nurse Practitioner

## 2019-01-05 ENCOUNTER — Inpatient Hospital Stay (HOSPITAL_BASED_OUTPATIENT_CLINIC_OR_DEPARTMENT_OTHER): Payer: Medicare Other | Admitting: Nurse Practitioner

## 2019-01-05 ENCOUNTER — Other Ambulatory Visit: Payer: Self-pay

## 2019-01-05 ENCOUNTER — Encounter: Payer: Self-pay | Admitting: Nurse Practitioner

## 2019-01-05 VITALS — BP 102/78 | HR 104 | Temp 97.7°F | Resp 17 | Ht 63.0 in | Wt 127.1 lb

## 2019-01-05 VITALS — HR 97

## 2019-01-05 DIAGNOSIS — Z95828 Presence of other vascular implants and grafts: Secondary | ICD-10-CM

## 2019-01-05 DIAGNOSIS — Z5111 Encounter for antineoplastic chemotherapy: Secondary | ICD-10-CM | POA: Diagnosis not present

## 2019-01-05 DIAGNOSIS — R918 Other nonspecific abnormal finding of lung field: Secondary | ICD-10-CM

## 2019-01-05 DIAGNOSIS — E041 Nontoxic single thyroid nodule: Secondary | ICD-10-CM

## 2019-01-05 DIAGNOSIS — C251 Malignant neoplasm of body of pancreas: Secondary | ICD-10-CM

## 2019-01-05 DIAGNOSIS — C258 Malignant neoplasm of overlapping sites of pancreas: Secondary | ICD-10-CM

## 2019-01-05 DIAGNOSIS — Z79899 Other long term (current) drug therapy: Secondary | ICD-10-CM

## 2019-01-05 DIAGNOSIS — C786 Secondary malignant neoplasm of retroperitoneum and peritoneum: Secondary | ICD-10-CM | POA: Diagnosis not present

## 2019-01-05 LAB — CMP (CANCER CENTER ONLY)
ALT: 31 U/L (ref 0–44)
AST: 32 U/L (ref 15–41)
Albumin: 3 g/dL — ABNORMAL LOW (ref 3.5–5.0)
Alkaline Phosphatase: 85 U/L (ref 38–126)
Anion gap: 9 (ref 5–15)
BUN: 12 mg/dL (ref 8–23)
CO2: 24 mmol/L (ref 22–32)
Calcium: 9 mg/dL (ref 8.9–10.3)
Chloride: 101 mmol/L (ref 98–111)
Creatinine: 0.77 mg/dL (ref 0.44–1.00)
GFR, Est AFR Am: >60 mL/min (ref >60)
GFR, Estimated: >60 mL/min (ref >60)
Glucose, Bld: 116 mg/dL — ABNORMAL HIGH (ref 70–99)
Potassium: 4.3 mmol/L (ref 3.5–5.1)
Sodium: 134 mmol/L — ABNORMAL LOW (ref 135–145)
Total Bilirubin: 0.5 mg/dL (ref 0.3–1.2)
Total Protein: 5.9 g/dL — ABNORMAL LOW (ref 6.5–8.1)

## 2019-01-05 LAB — CBC WITH DIFFERENTIAL (CANCER CENTER ONLY)
Abs Immature Granulocytes: 0.04 10*3/uL (ref 0.00–0.07)
Basophils Absolute: 0 10*3/uL (ref 0.0–0.1)
Basophils Relative: 0 %
Eosinophils Absolute: 0.2 10*3/uL (ref 0.0–0.5)
Eosinophils Relative: 2 %
HCT: 36.8 % (ref 36.0–46.0)
Hemoglobin: 12.3 g/dL (ref 12.0–15.0)
Immature Granulocytes: 1 %
Lymphocytes Relative: 8 %
Lymphs Abs: 0.6 10*3/uL — ABNORMAL LOW (ref 0.7–4.0)
MCH: 29.2 pg (ref 26.0–34.0)
MCHC: 33.4 g/dL (ref 30.0–36.0)
MCV: 87.4 fL (ref 80.0–100.0)
Monocytes Absolute: 0.9 10*3/uL (ref 0.1–1.0)
Monocytes Relative: 13 %
Neutro Abs: 5.3 10*3/uL (ref 1.7–7.7)
Neutrophils Relative %: 76 %
Platelet Count: 355 10*3/uL (ref 150–400)
RBC: 4.21 MIL/uL (ref 3.87–5.11)
RDW: 14.3 % (ref 11.5–15.5)
WBC Count: 7 10*3/uL (ref 4.0–10.5)
nRBC: 0 % (ref 0.0–0.2)

## 2019-01-05 MED ORDER — PREDNISONE 10 MG PO TABS: 10.0000 mg | ORAL_TABLET | Freq: Every day | ORAL | 0 refills | Status: DC

## 2019-01-05 MED ORDER — TRAMADOL-ACETAMINOPHEN 37.5-325 MG PO TABS: 1.0000 | ORAL_TABLET | Freq: Three times a day (TID) | ORAL | 0 refills | Status: DC | PRN

## 2019-01-05 MED ORDER — SODIUM CHLORIDE 0.9% FLUSH
10.0000 mL | INTRAVENOUS | Status: DC | PRN
  Administered 2019-01-05: 10 mL
  Filled 2019-01-05: qty 10

## 2019-01-05 MED ORDER — PROCHLORPERAZINE MALEATE 10 MG PO TABS
5.0000 mg | ORAL_TABLET | Freq: Once | ORAL | Status: AC
  Administered 2019-01-05: 11:00:00 5 mg via ORAL

## 2019-01-05 MED ORDER — SODIUM CHLORIDE 0.9 % IV SOLN
Freq: Once | INTRAVENOUS | Status: AC
  Administered 2019-01-05: 11:00:00 via INTRAVENOUS
  Filled 2019-01-05: qty 250

## 2019-01-05 MED ORDER — SODIUM CHLORIDE 0.9 % IV SOLN
800.0000 mg/m2 | Freq: Once | INTRAVENOUS | Status: AC
  Administered 2019-01-05: 1330 mg via INTRAVENOUS
  Filled 2019-01-05: qty 34.98

## 2019-01-05 MED ORDER — PROCHLORPERAZINE MALEATE 10 MG PO TABS
ORAL_TABLET | ORAL | Status: AC
  Filled 2019-01-05: qty 1

## 2019-01-05 MED ORDER — HEPARIN SOD (PORK) LOCK FLUSH 100 UNIT/ML IV SOLN
500.0000 [IU] | Freq: Once | INTRAVENOUS | Status: AC | PRN
  Administered 2019-01-05: 500 [IU]
  Filled 2019-01-05: qty 5

## 2019-01-05 MED ORDER — PACLITAXEL PROTEIN-BOUND CHEMO INJECTION 100 MG
100.0000 mg/m2 | Freq: Once | INTRAVENOUS | Status: AC
  Administered 2019-01-05: 12:00:00 175 mg via INTRAVENOUS
  Filled 2019-01-05: qty 35

## 2019-01-05 MED ORDER — SODIUM CHLORIDE 0.9% FLUSH
10.0000 mL | INTRAVENOUS | Status: DC | PRN
  Administered 2019-01-05: 10 mL via INTRAVENOUS
  Filled 2019-01-05: qty 10

## 2019-01-05 NOTE — Patient Instructions (Signed)
Coronavirus (COVID-19) Are you at risk?  Are you at risk for the Coronavirus (COVID-19)?  To be considered HIGH RISK for Coronavirus (COVID-19), you have to meet the following criteria:  . Traveled to [place], [place], [place], [place] or [place]; or in the [place] to [place], [place], [place], or [place]; and have fever, cough, and shortness of breath within the last 2 weeks of travel OR . Been in close contact with a person diagnosed with COVID-19 within the last 2 weeks and have fever, cough, and shortness of breath . IF YOU DO NOT MEET THESE CRITERIA, YOU ARE CONSIDERED LOW RISK FOR COVID-19.  What to do if you are HIGH RISK for COVID-19?  . If you are having a medical emergency, call 911. . Seek medical care right away. Before you go to a doctor's office, urgent care or emergency department, call ahead and tell them about your recent travel, contact with someone diagnosed with COVID-19, and your symptoms. You should receive instructions from your physician's office regarding next steps of care.  . When you arrive at healthcare provider, tell the healthcare staff immediately you have returned from visiting [place], [place], [place], [place] or [place]; or traveled in the [place] to [place], [place], [place], or [place]; in the last two weeks or you have been in close contact with a person diagnosed with COVID-19 in the last 2 weeks.   . Tell the health care staff about your symptoms: fever, cough and shortness of breath. . After you have been seen by a medical provider, you will be either: o Tested for (COVID-19) and discharged home on quarantine except to seek medical care if symptoms worsen, and asked to  - Stay home and avoid contact with others until you get your results (4-5 days)  - Avoid travel on public transportation if possible (such as bus, train, or airplane) or o Sent to the Emergency Department by EMS for evaluation, COVID-19 testing, and possible  admission depending on your condition and test results.  What to do if you are LOW RISK for COVID-19?  Reduce your risk of any infection by using the same precautions used for avoiding the common cold or flu:  . Wash your hands often with soap and warm water for at least 20 seconds.  If soap and water are not readily available, use an alcohol-based hand sanitizer with at least 60% alcohol.  . If coughing or sneezing, cover your mouth and nose by coughing or sneezing into the elbow areas of your shirt or coat, into a tissue or into your sleeve (not your hands). . Avoid shaking hands with others and consider head nods or verbal greetings only. . Avoid touching your eyes, nose, or mouth with unwashed hands.  . Avoid close contact with people who are sick. . Avoid places or events with large numbers of people in one location, like concerts or sporting events. . Carefully consider travel plans you have or are making. . If you are planning any travel outside or inside the [place], visit the CDC's Travelers' Health webpage for the latest health notices. . If you have some symptoms but not all symptoms, continue to monitor at home and seek medical attention if your symptoms worsen. . If you are having a medical emergency, call 911.   ADDITIONAL HEALTHCARE OPTIONS FOR PATIENTS  [facility] / e-Visit: [email or web address]         [facility]: [phone]  Onset   Urgent Care: [facility]: [facility] Discharge Instructions for Patients Receiving Chemotherapy  Today you received the following chemotherapy agents: Paclitaxel protein-bound (Abraxane) and Gemcitabine (Gemzar)  To help prevent nausea and vomiting after your treatment, we encourage you to take your nausea medication as directed.    If you develop nausea and vomiting that is not controlled by  your nausea medication, call the clinic.   BELOW ARE SYMPTOMS THAT SHOULD BE REPORTED IMMEDIATELY:  *FEVER GREATER THAN 100.5 F  *CHILLS WITH OR WITHOUT FEVER  NAUSEA AND VOMITING THAT IS NOT CONTROLLED WITH YOUR NAUSEA MEDICATION  *UNUSUAL SHORTNESS OF BREATH  *UNUSUAL BRUISING OR BLEEDING  TENDERNESS IN MOUTH AND THROAT WITH OR WITHOUT PRESENCE OF ULCERS  *URINARY PROBLEMS  *BOWEL PROBLEMS  UNUSUAL RASH Items with * indicate a potential emergency and should be followed up as soon as possible.  Feel free to call the clinic should you have any questions or concerns. The clinic phone number is [phone].  Please show the [facility] at check-in to the Emergency Department and triage nurse.

## 2019-01-05 NOTE — Progress Notes (Addendum)
[facility] OFFICE PROGRESS NOTE   Diagnosis:  Pancreas [name]  INTERVAL HISTORY:   [facility] [name] returns as scheduled.  She completed cycle 1 gemcitabine/Abraxane for 12/22/2018.  She denies nausea/vomiting.  No mouth sores.  No diarrhea.  No rash.  No fever.  She denies shortness of breath and cough.  She denies bleeding.  Her daughter joined our visit by phone.  She reports that Tiffany [name] had one episode of nausea and one episode of diarrhea following the chemotherapy.  She further reports Tiffany [name] is unable to tolerate Ultram 50 mg.  She is able to tolerate 25 mg.  Her daughter notes exacerbation of multiple symptoms after a 50 mg dose.  The 25 mg dose help her pain.  She further reports Tiffany [name] is not eating well and she is not surprised by the weight loss noted today.  Objective:  Vital signs in last 24 hours:  Blood pressure 102/78, pulse (!) 104, temperature 97.7 F (36.5 C), temperature source Oral, resp. rate 17, height 5\' 3"  (1.6 m), weight 127 lb 1.6 oz (57.7 kg), SpO2 98 %.    HEENT: Mild white coating over tongue.  No buccal thrush. Resp: Lungs clear bilaterally. Cardio: Regular rate and rhythm. Vascular: No leg edema. Port-A-Cath without erythema.   Lab Results:  Lab Results  Component Value Date   WBC 7.0 01/05/2019   HGB 12.3 01/05/2019   HCT 36.8 01/05/2019   MCV 87.4 01/05/2019   PLT 355 01/05/2019   NEUTROABS 5.3 01/05/2019    Imaging:  No results found.  Medications: I have reviewed the patient's current medications.  Assessment/Plan: 1. Pancreas mass/lung nodules/peritoneal nodules  Abdominal ultrasound 11/25/2018-1.0 cm pancreatic head cyst and a 5.5 mm lymph node adjacent to the pancreatic head.    MRI abdomen 11/25/2018-irregular poorly marginated hypoenhancing 3.2 x 2.2 cm posterior pancreatic body mass encasing the celiac artery; separate 1.4 cm pancreatic neck and 1.2 cm posterior pancreatic head cystic masses; multiple lung  masses/pulmonary nodules scattered throughout both lung bases measuring up to 3.5 cm; extensive peritoneal carcinomatosis with soft tissue metastases throughout the omentum and perihepatic space; trace ascites.  CTs 12/06/2018- large left thyroid nodule, bilateral pulmonary nodules, pancreas body/tail mass with involvement of the celiac axis, peritoneal nodularity, omental caking at the anterior abdominal wall  CT biopsy of an omental nodule 12/12/2018- metastatic adenocarcinoma consistent with pancreas [name]  Cycle 1 gemcitabine/Abraxane 12/22/2018  Common hereditary [name] panel-negative  Cycle 2 gemcitabine/Abraxane 01/05/2019 2. Abdominal pain secondary to #1 3. Hypertension 4. Hypertrophic obstructive cardiomyopathy 5. Cough-likely secondary to pulmonary metastases 6. Hypercalcemia 12/22/2018 status post Zometa  Disposition: Tiffany [name] has completed 1 cycle of gemcitabine/Abraxane.  She tolerated the chemotherapy without significant acute toxicity.  She continues to have a borderline performance status.  She and her daughter understand this is due to the [name].  She would like to continue chemotherapy for now.  Plan to proceed with cycle 2 gemcitabine/Abraxane today as scheduled.  For appetite she will begin a trial of prednisone 10 mg daily.  For pain she will try Ultracet 37.5 mg every 8 hours as needed.  We will schedule a WebEx visit in 1 week.  Tiffany [name] and her daughter understand to contact the office prior to that appointment with any problems.  Patient seen with Dr. Benay [name].  25 minutes were spent face-to-face at today's visit with the majority of that time involved in counseling/coordination of care.    Tiffany [name] ANP/GNP-BC   01/05/2019  10:45 AM  This was a shared visit with Tiffany [name].  Tiffany [name] continues to have a borderline performance status to receive chemotherapy.  We discussed options with she and her daughter (daughter by telephone).  We discussed comfort care versus  continuing chemotherapy.  She would like to proceed with another course of chemotherapy today.  We adjusted the narcotic pain regimen today.  We added prednisone as an appetite stimulant.  I discussed Tiffany [name]'s current status with her daughter by telephone.  They will contact us as needed.  Tiffany Manson, MD

## 2019-01-05 NOTE — Telephone Encounter (Signed)
Scheduled appt per 4/16 los. °

## 2019-01-09 ENCOUNTER — Telehealth: Payer: Self-pay | Admitting: *Deleted

## 2019-01-09 NOTE — Telephone Encounter (Signed)
Called to follow up on call made to answering service on 01/07/19 by daughter. She reports this treatment "hit her really hard". Can't get her to eat except a few bites. Not drinking much either, however she is a little better than on 01/07/19. No nausea or vomiting. Prednisone is not helping her appetite. Had some pain last night that the Ultracet did relieve. Encouraged daughter to push fluids in small amounts--try to get her to drink 4 ounces every 30-60 minutes.

## 2019-01-11 ENCOUNTER — Telehealth: Payer: Self-pay | Admitting: Oncology

## 2019-01-11 NOTE — Telephone Encounter (Signed)
Called regarding upcoming Webex appointment, patient's daughter is notified and will set it up for her. Test run complete and e-mail sent.

## 2019-01-12 ENCOUNTER — Inpatient Hospital Stay (HOSPITAL_BASED_OUTPATIENT_CLINIC_OR_DEPARTMENT_OTHER): Payer: Medicare Other | Admitting: Oncology

## 2019-01-12 ENCOUNTER — Telehealth: Payer: Self-pay | Admitting: Oncology

## 2019-01-12 DIAGNOSIS — C251 Malignant neoplasm of body of pancreas: Secondary | ICD-10-CM

## 2019-01-12 NOTE — Progress Notes (Signed)
[facility] OFFICE VISIT PROGRESS NOTE  I connected wit Tiffany [name] on 01/12/19 at 12:13 PM by telephone and verified that I am speaking with the correct person using two identifiers.    Other persons participating in the visit and their role in the encounter: Daughter  Patient's location: Home Provider's location: Office   Diagnosis: Pancreas cancer  INTERVAL HISTORY:   Tiffany [name] completed another cycle of gemcitabine/Abraxane on 01/05/2019.  She had nausea on day 1, relieved with Compazine.  She had an episode of diarrhea on day 2 and took Imodium. She reports only 2 episodes of pain since the last treatment.  Tramadol relieves the pain and she tolerated the tramadol well.  She continues to have anorexia.  She took 2 doses of prednisone and this did not help.  Her daughter reports Tiffany [name] is eating and drinking very little.  She has increased malaise.  She was able to go outside for a few minutes yesterday.   Medications: I have reviewed the patient's current medications.  She is no longer taking vitamin D, a multivitamin, or Osteo Bi-Flex  Assessment/Plan: 1. Pancreas mass/lung nodules/peritoneal nodules  Abdominal ultrasound 11/25/2018-1.0 cm pancreatic head cyst and a 5.5 mm lymph node adjacent to the pancreatic head.   MRI abdomen 11/25/2018-irregular poorly marginated hypoenhancing 3.2 x 2.2 cm posterior pancreatic body mass encasing the celiac artery; separate 1.4 cm pancreatic neck and 1.2 cm posterior pancreatic head cystic masses; multiple lung masses/pulmonary nodules scattered throughout both lung bases measuring up to 3.5 cm; extensive peritoneal carcinomatosis with soft tissue metastases throughout the omentum and perihepatic space; trace ascites.  CTs 12/06/2018- large left thyroid nodule, bilateral pulmonary nodules, pancreas body/tail mass with involvement of the celiac axis, peritoneal nodularity, omental caking at the  anterior abdominal wall  CT biopsy of an omental nodule 12/12/2018- metastatic adenocarcinoma consistent with pancreas cancer  Cycle 1 gemcitabine/Abraxane 12/22/2018  Cycle 2 gemcitabine/Abraxane 01/05/2019  Common hereditary cancer panel-negative  Cycle 2 gemcitabine/Abraxane 01/05/2019 2. Abdominal pain secondary to #1 3. Hypertension 4. Hypertrophic obstructive cardiomyopathy 5. Cough-likely secondary to pulmonary metastases 6. Hypercalcemia 12/22/2018 status post Zometa 7. Anorexia secondary to #1   Disposition: Tiffany [name] has metastatic pancreas cancer.  She continues to have a poor performance status.  She has completed 2 cycles of chemotherapy.  Her appetite remains poor. I am encouraged that her pain seems improved over the past week.  I discussed the current situation and treatment options with Tiffany [name] and her daughter.  She will return as scheduled next week.  We will decide on continuing chemotherapy versus hospice care when she is here next week.  She will contact us sooner if she is unable to make the office appointment and we will arrange for another virtual visit.  She will continue tramadol as needed for pain.  She will begin a trial of prednisone at a dose of 20 mg daily.   I discussed the assessment and treatment plan with the patient. The patient was provided an opportunity to ask questions and all were answered. The patient agreed with the plan and demonstrated an understanding of the instructions.   The patient was advised to call back or seek an in-person evaluation if the symptoms worsen or if the condition fails to improve as anticipated.  I provided 21 minutes of video and documentation time during this encounter, and > 50% was spent counseling as documented under my assessment & plan.  Betsy Coder ANP/GNP-BC  01/12/2019 12:15 PM

## 2019-01-12 NOTE — Telephone Encounter (Signed)
No los per 4/23. °

## 2019-01-15 ENCOUNTER — Other Ambulatory Visit: Payer: Self-pay | Admitting: Oncology

## 2019-01-19 ENCOUNTER — Telehealth: Payer: Self-pay | Admitting: *Deleted

## 2019-01-19 NOTE — Telephone Encounter (Addendum)
Daughter reports two episodes last night within 1 hour of each other lasting about 5 minutes each of sensation she could not catch her breath, chills (temp 96.9-97.0), confused and hands and feet felt numb. Today she is back to baseline: able to walk short distances in home and had OJ and grits for breakfast. Confirm they will be here on 01/20/19 as scheduled. MD notified and said to call for recurrent symptoms. Keep appointment for 01/20/19 as scheduled. Daughter will video the event if it occurs again.

## 2019-01-20 ENCOUNTER — Other Ambulatory Visit: Payer: Self-pay | Admitting: Nurse Practitioner

## 2019-01-20 ENCOUNTER — Telehealth: Payer: Self-pay | Admitting: Oncology

## 2019-01-20 ENCOUNTER — Inpatient Hospital Stay: Payer: Medicare Other | Attending: Oncology

## 2019-01-20 ENCOUNTER — Inpatient Hospital Stay (HOSPITAL_BASED_OUTPATIENT_CLINIC_OR_DEPARTMENT_OTHER): Payer: Medicare Other | Admitting: Oncology

## 2019-01-20 ENCOUNTER — Inpatient Hospital Stay: Payer: Medicare Other

## 2019-01-20 ENCOUNTER — Other Ambulatory Visit: Payer: Self-pay

## 2019-01-20 VITALS — BP 105/68 | HR 98 | Temp 98.0°F | Resp 17 | Ht 63.0 in | Wt 124.5 lb

## 2019-01-20 DIAGNOSIS — I10 Essential (primary) hypertension: Secondary | ICD-10-CM | POA: Diagnosis not present

## 2019-01-20 DIAGNOSIS — C251 Malignant neoplasm of body of pancreas: Secondary | ICD-10-CM

## 2019-01-20 DIAGNOSIS — E041 Nontoxic single thyroid nodule: Secondary | ICD-10-CM

## 2019-01-20 DIAGNOSIS — R918 Other nonspecific abnormal finding of lung field: Secondary | ICD-10-CM | POA: Insufficient documentation

## 2019-01-20 DIAGNOSIS — Z79899 Other long term (current) drug therapy: Secondary | ICD-10-CM

## 2019-01-20 DIAGNOSIS — C258 Malignant neoplasm of overlapping sites of pancreas: Secondary | ICD-10-CM | POA: Insufficient documentation

## 2019-01-20 DIAGNOSIS — C786 Secondary malignant neoplasm of retroperitoneum and peritoneum: Secondary | ICD-10-CM | POA: Diagnosis not present

## 2019-01-20 DIAGNOSIS — G893 Neoplasm related pain (acute) (chronic): Secondary | ICD-10-CM | POA: Diagnosis not present

## 2019-01-20 DIAGNOSIS — R05 Cough: Secondary | ICD-10-CM | POA: Insufficient documentation

## 2019-01-20 DIAGNOSIS — Z95828 Presence of other vascular implants and grafts: Secondary | ICD-10-CM

## 2019-01-20 DIAGNOSIS — Z5111 Encounter for antineoplastic chemotherapy: Secondary | ICD-10-CM | POA: Diagnosis not present

## 2019-01-20 DIAGNOSIS — I421 Obstructive hypertrophic cardiomyopathy: Secondary | ICD-10-CM | POA: Diagnosis not present

## 2019-01-20 LAB — CMP (CANCER CENTER ONLY)
ALT: 23 U/L (ref 0–44)
AST: 23 U/L (ref 15–41)
Albumin: 2.8 g/dL — ABNORMAL LOW (ref 3.5–5.0)
Alkaline Phosphatase: 69 U/L (ref 38–126)
Anion gap: 7 (ref 5–15)
BUN: 15 mg/dL (ref 8–23)
CO2: 28 mmol/L (ref 22–32)
Calcium: 9.2 mg/dL (ref 8.9–10.3)
Chloride: 104 mmol/L (ref 98–111)
Creatinine: 0.76 mg/dL (ref 0.44–1.00)
GFR, Est AFR Am: >60 mL/min (ref >60)
GFR, Estimated: >60 mL/min (ref >60)
Glucose, Bld: 135 mg/dL — ABNORMAL HIGH (ref 70–99)
Potassium: 3.8 mmol/L (ref 3.5–5.1)
Sodium: 139 mmol/L (ref 135–145)
Total Bilirubin: 0.5 mg/dL (ref 0.3–1.2)
Total Protein: 5.3 g/dL — ABNORMAL LOW (ref 6.5–8.1)

## 2019-01-20 LAB — CBC WITH DIFFERENTIAL (CANCER CENTER ONLY)
Abs Immature Granulocytes: 0.11 10*3/uL — ABNORMAL HIGH (ref 0.00–0.07)
Basophils Absolute: 0 10*3/uL (ref 0.0–0.1)
Basophils Relative: 0 %
Eosinophils Absolute: 0.2 10*3/uL (ref 0.0–0.5)
Eosinophils Relative: 2 %
HCT: 36.1 % (ref 36.0–46.0)
Hemoglobin: 11.9 g/dL — ABNORMAL LOW (ref 12.0–15.0)
Immature Granulocytes: 1 %
Lymphocytes Relative: 9 %
Lymphs Abs: 0.7 10*3/uL (ref 0.7–4.0)
MCH: 29 pg (ref 26.0–34.0)
MCHC: 33 g/dL (ref 30.0–36.0)
MCV: 88 fL (ref 80.0–100.0)
Monocytes Absolute: 1 10*3/uL (ref 0.1–1.0)
Monocytes Relative: 12 %
Neutro Abs: 6.3 10*3/uL (ref 1.7–7.7)
Neutrophils Relative %: 76 %
Platelet Count: 283 10*3/uL (ref 150–400)
RBC: 4.1 MIL/uL (ref 3.87–5.11)
RDW: 17 % — ABNORMAL HIGH (ref 11.5–15.5)
WBC Count: 8.3 10*3/uL (ref 4.0–10.5)
nRBC: 0 % (ref 0.0–0.2)

## 2019-01-20 MED ORDER — HEPARIN SOD (PORK) LOCK FLUSH 100 UNIT/ML IV SOLN
500.0000 [IU] | Freq: Once | INTRAVENOUS | Status: AC | PRN
  Administered 2019-01-20: 13:00:00 500 [IU]
  Filled 2019-01-20: qty 5

## 2019-01-20 MED ORDER — SODIUM CHLORIDE 0.9% FLUSH
10.0000 mL | INTRAVENOUS | Status: DC | PRN
  Administered 2019-01-20: 13:00:00 10 mL
  Filled 2019-01-20: qty 10

## 2019-01-20 MED ORDER — FLUCONAZOLE 50 MG PO TABS: 50.0000 mg | ORAL_TABLET | Freq: Every day | ORAL | 0 refills | Status: AC

## 2019-01-20 MED ORDER — PACLITAXEL PROTEIN-BOUND CHEMO INJECTION 100 MG: 100.0000 mg/m2 | Freq: Once | Status: DC

## 2019-01-20 MED ORDER — PROCHLORPERAZINE MALEATE 10 MG PO TABS
ORAL_TABLET | ORAL | Status: AC
  Filled 2019-01-20: qty 1

## 2019-01-20 MED ORDER — SODIUM CHLORIDE 0.9 % IV SOLN
Freq: Once | INTRAVENOUS | Status: AC
  Administered 2019-01-20: 10:00:00 via INTRAVENOUS
  Filled 2019-01-20: qty 250

## 2019-01-20 MED ORDER — SODIUM CHLORIDE 0.9 % IV SOLN
800.0000 mg/m2 | Freq: Once | INTRAVENOUS | Status: AC
  Administered 2019-01-20: 12:00:00 1330 mg via INTRAVENOUS
  Filled 2019-01-20: qty 34.98

## 2019-01-20 MED ORDER — SODIUM CHLORIDE 0.9% FLUSH
10.0000 mL | INTRAVENOUS | Status: DC | PRN
  Administered 2019-01-20: 10 mL via INTRAVENOUS
  Filled 2019-01-20: qty 10

## 2019-01-20 MED ORDER — PACLITAXEL PROTEIN-BOUND CHEMO INJECTION 100 MG
100.0000 mg/m2 | Freq: Once | INTRAVENOUS | Status: AC
  Administered 2019-01-20: 11:00:00 150 mg via INTRAVENOUS
  Filled 2019-01-20: qty 30

## 2019-01-20 MED ORDER — PROCHLORPERAZINE MALEATE 10 MG PO TABS
5.0000 mg | ORAL_TABLET | Freq: Once | ORAL | Status: AC
  Administered 2019-01-20: 5 mg via ORAL

## 2019-01-20 NOTE — Progress Notes (Signed)
[facility] OFFICE PROGRESS NOTE   Diagnosis: Pancreas cancer  INTERVAL HISTORY:   Tiffany [name] reports improvement in her appetite since last week.  She is taking prednisone, but has missed this for the past few days.  No pain.  She had 2 episodes of dyspnea and anxiety in the evening this week, each lasting 10-20 minutes.  No associated symptoms.  No fever.  She continues to have a decreased activity level.  Her daughter is present by telephone today.  Objective:  Vital signs in last 24 hours:  Blood pressure 105/68, pulse (!) 108, temperature 98 F (36.7 C), temperature source Oral, resp. rate 17, height 5\' 3"  (1.6 m), weight 124 lb 8 oz (56.5 kg), SpO2 100 %.    HEENT: Mild thrush over the tongue and buccal mucosa Resp: Rales at the left greater than right posterior base, no respiratory distress Cardio: Regular rate and rhythm GI: No mass, soft, nontender, no hepatomegaly Vascular: No leg edema   Portacath/PICC-without erythema  Lab Results:  Lab Results  Component Value Date   WBC 8.3 01/20/2019   HGB 11.9 (L) 01/20/2019   HCT 36.1 01/20/2019   MCV 88.0 01/20/2019   PLT 283 01/20/2019   NEUTROABS 6.3 01/20/2019    CMP  Lab Results  Component Value Date   NA 134 (L) 01/05/2019   K 4.3 01/05/2019   CL 101 01/05/2019   CO2 24 01/05/2019   GLUCOSE 116 (H) 01/05/2019   BUN 12 01/05/2019   CREATININE 0.77 01/05/2019   CALCIUM 9.0 01/05/2019   PROT 5.9 (L) 01/05/2019   ALBUMIN 3.0 (L) 01/05/2019   AST 32 01/05/2019   ALT 31 01/05/2019   ALKPHOS 85 01/05/2019   BILITOT 0.5 01/05/2019   GFRNONAA >60 01/05/2019   GFRAA >60 01/05/2019     Medications: I have reviewed the patient's current medications.   Assessment/Plan: 1. Pancreas mass/lung nodules/peritoneal nodules  Abdominal ultrasound 11/25/2018-1.0 cm pancreatic head cyst and a 5.5 mm lymph node adjacent to the pancreatic head.   MRI abdomen 11/25/2018-irregular poorly marginated  hypoenhancing 3.2 x 2.2 cm posterior pancreatic body mass encasing the celiac artery; separate 1.4 cm pancreatic neck and 1.2 cm posterior pancreatic head cystic masses; multiple lung masses/pulmonary nodules scattered throughout both lung bases measuring up to 3.5 cm; extensive peritoneal carcinomatosis with soft tissue metastases throughout the omentum and perihepatic space; trace ascites.  CTs 12/06/2018- large left thyroid nodule, bilateral pulmonary nodules, pancreas body/tail mass with involvement of the celiac axis, peritoneal nodularity, omental caking at the anterior abdominal wall  CT biopsy of an omental nodule 12/12/2018- metastatic adenocarcinoma consistent with pancreas cancer  Cycle 1 gemcitabine/Abraxane 12/22/2018  Cycle 2 gemcitabine/Abraxane 01/05/2019  Common hereditary cancer panel-negative  Cycle 3 gemcitabine/Abraxane 01/20/2019 2. Abdominal pain secondary to #1-improved 3. Hypertension 4. Hypertrophic obstructive cardiomyopathy 5. Cough-likely secondary to pulmonary metastases 6. Hypercalcemia 12/22/2018 status post Zometa 7. Anorexia secondary to #1     Disposition: Tiffany [name] has completed 2 treatments with gemcitabine/Abraxane.  Her performance status appears partially improved today.  She has a better appetite.  No pain at present.  The calcium remains normal.  We discussed options of supportive care versus continuing chemotherapy.  She would like to continue chemotherapy.  She will complete cycle 3 gemcitabine/Abraxane today.  She will complete a course of Diflucan for oral candidiasis.  Tiffany [name] will be scheduled for a WebEx visit next week.  She will return for an office visit in the next cycle of  chemotherapy in 2 weeks.  I discussed the plan with her daughter by telephone.  We will consider scheduling a restaging CT after 5 or 6 cycles of chemotherapy.  The etiology of the brief episodes of dyspnea and this week is unclear.  This may have been related to  anxiety.  She will contact us for recurrent symptoms.   Betsy Coder, MD  01/20/2019  9:22 AM

## 2019-01-20 NOTE — Patient Instructions (Signed)
Coronavirus (COVID-19) Are you at risk?  Are you at risk for the Coronavirus (COVID-19)?  To be considered HIGH RISK for Coronavirus (COVID-19), you have to meet the following criteria:  . Traveled to [place], [place], [place], [place] or [place]; or in the [place] to [place], [place], [place], or [place]; and have fever, cough, and shortness of breath within the last 2 weeks of travel OR . Been in close contact with a person diagnosed with COVID-19 within the last 2 weeks and have fever, cough, and shortness of breath . IF YOU DO NOT MEET THESE CRITERIA, YOU ARE CONSIDERED LOW RISK FOR COVID-19.  What to do if you are HIGH RISK for COVID-19?  . If you are having a medical emergency, call 911. . Seek medical care right away. Before you go to a doctor's office, urgent care or emergency department, call ahead and tell them about your recent travel, contact with someone diagnosed with COVID-19, and your symptoms. You should receive instructions from your physician's office regarding next steps of care.  . When you arrive at healthcare provider, tell the healthcare staff immediately you have returned from visiting [place], [place], [place], [place] or [place]; or traveled in the [place] to [place], [place], [place], or [place]; in the last two weeks or you have been in close contact with a person diagnosed with COVID-19 in the last 2 weeks.   . Tell the health care staff about your symptoms: fever, cough and shortness of breath. . After you have been seen by a medical provider, you will be either: o Tested for (COVID-19) and discharged home on quarantine except to seek medical care if symptoms worsen, and asked to  - Stay home and avoid contact with others until you get your results (4-5 days)  - Avoid travel on public transportation if possible (such as bus, train, or airplane) or o Sent to the Emergency Department by EMS for evaluation, COVID-19 testing, and possible  admission depending on your condition and test results.  What to do if you are LOW RISK for COVID-19?  Reduce your risk of any infection by using the same precautions used for avoiding the common cold or flu:  . Wash your hands often with soap and warm water for at least 20 seconds.  If soap and water are not readily available, use an alcohol-based hand sanitizer with at least 60% alcohol.  . If coughing or sneezing, cover your mouth and nose by coughing or sneezing into the elbow areas of your shirt or coat, into a tissue or into your sleeve (not your hands). . Avoid shaking hands with others and consider head nods or verbal greetings only. . Avoid touching your eyes, nose, or mouth with unwashed hands.  . Avoid close contact with people who are sick. . Avoid places or events with large numbers of people in one location, like concerts or sporting events. . Carefully consider travel plans you have or are making. . If you are planning any travel outside or inside the [place], visit the CDC's Travelers' Health webpage for the latest health notices. . If you have some symptoms but not all symptoms, continue to monitor at home and seek medical attention if your symptoms worsen. . If you are having a medical emergency, call 911.   ADDITIONAL HEALTHCARE OPTIONS FOR PATIENTS  [facility] / e-Visit: [email or web address]         [facility]: [phone]  [facility]: [phone]                   [facility]: [phone]   [facility] Discharge Instructions for Patients Receiving Chemotherapy  Today you received the following chemotherapy agents Abraxane and Gemzar  To help prevent nausea and vomiting after your treatment, we encourage you to take your nausea medication as directed.    If you develop nausea and vomiting that is not controlled by your nausea medication, call the clinic.    BELOW ARE SYMPTOMS THAT SHOULD BE REPORTED IMMEDIATELY:  *FEVER GREATER THAN 100.5 F  *CHILLS WITH OR WITHOUT FEVER  NAUSEA AND VOMITING THAT IS NOT CONTROLLED WITH YOUR NAUSEA MEDICATION  *UNUSUAL SHORTNESS OF BREATH  *UNUSUAL BRUISING OR BLEEDING  TENDERNESS IN MOUTH AND THROAT WITH OR WITHOUT PRESENCE OF ULCERS  *URINARY PROBLEMS  *BOWEL PROBLEMS  UNUSUAL RASH Items with * indicate a potential emergency and should be followed up as soon as possible.  Feel free to call the clinic should you have any questions or concerns. The clinic phone number is [phone].  Please show the CHEMO ALERT CARD at check-in to the Emergency Department and triage nurse.   

## 2019-01-20 NOTE — Telephone Encounter (Signed)
Scheduled appt per 5/1 los °

## 2019-01-20 NOTE — Patient Instructions (Signed)

## 2019-01-23 ENCOUNTER — Encounter (HOSPITAL_COMMUNITY): Payer: Self-pay | Admitting: Emergency Medicine

## 2019-01-23 ENCOUNTER — Other Ambulatory Visit: Payer: Self-pay

## 2019-01-23 ENCOUNTER — Emergency Department (HOSPITAL_COMMUNITY): Payer: Medicare Other

## 2019-01-23 ENCOUNTER — Observation Stay (HOSPITAL_COMMUNITY)
Admission: EM | Admit: 2019-01-23 | Discharge: 2019-01-24 | Disposition: A | Discharge status: Home / Self Care | Payer: Medicare Other | Attending: Family Medicine | Admitting: Family Medicine

## 2019-01-23 ENCOUNTER — Observation Stay (HOSPITAL_BASED_OUTPATIENT_CLINIC_OR_DEPARTMENT_OTHER): Payer: Medicare Other

## 2019-01-23 DIAGNOSIS — R7989 Other specified abnormal findings of blood chemistry: Secondary | ICD-10-CM | POA: Diagnosis not present

## 2019-01-23 DIAGNOSIS — E041 Nontoxic single thyroid nodule: Secondary | ICD-10-CM | POA: Insufficient documentation

## 2019-01-23 DIAGNOSIS — Z8249 Family history of ischemic heart disease and other diseases of the circulatory system: Secondary | ICD-10-CM | POA: Insufficient documentation

## 2019-01-23 DIAGNOSIS — I340 Nonrheumatic mitral (valve) insufficiency: Secondary | ICD-10-CM | POA: Diagnosis not present

## 2019-01-23 DIAGNOSIS — R748 Abnormal levels of other serum enzymes: Secondary | ICD-10-CM | POA: Diagnosis not present

## 2019-01-23 DIAGNOSIS — C786 Secondary malignant neoplasm of retroperitoneum and peritoneum: Secondary | ICD-10-CM

## 2019-01-23 DIAGNOSIS — C250 Malignant neoplasm of head of pancreas: Secondary | ICD-10-CM

## 2019-01-23 DIAGNOSIS — I421 Obstructive hypertrophic cardiomyopathy: Secondary | ICD-10-CM | POA: Diagnosis not present

## 2019-01-23 DIAGNOSIS — Z9221 Personal history of antineoplastic chemotherapy: Secondary | ICD-10-CM | POA: Insufficient documentation

## 2019-01-23 DIAGNOSIS — D649 Anemia, unspecified: Secondary | ICD-10-CM | POA: Diagnosis not present

## 2019-01-23 DIAGNOSIS — Z85828 Personal history of other malignant neoplasm of skin: Secondary | ICD-10-CM | POA: Diagnosis not present

## 2019-01-23 DIAGNOSIS — R778 Other specified abnormalities of plasma proteins: Secondary | ICD-10-CM

## 2019-01-23 DIAGNOSIS — C7800 Secondary malignant neoplasm of unspecified lung: Secondary | ICD-10-CM

## 2019-01-23 DIAGNOSIS — I2699 Other pulmonary embolism without acute cor pulmonale: Secondary | ICD-10-CM

## 2019-01-23 DIAGNOSIS — J90 Pleural effusion, not elsewhere classified: Secondary | ICD-10-CM | POA: Insufficient documentation

## 2019-01-23 DIAGNOSIS — C251 Malignant neoplasm of body of pancreas: Secondary | ICD-10-CM | POA: Diagnosis not present

## 2019-01-23 DIAGNOSIS — C7801 Secondary malignant neoplasm of right lung: Secondary | ICD-10-CM | POA: Diagnosis not present

## 2019-01-23 DIAGNOSIS — C7802 Secondary malignant neoplasm of left lung: Secondary | ICD-10-CM | POA: Diagnosis not present

## 2019-01-23 DIAGNOSIS — I10 Essential (primary) hypertension: Secondary | ICD-10-CM | POA: Diagnosis not present

## 2019-01-23 DIAGNOSIS — Z79899 Other long term (current) drug therapy: Secondary | ICD-10-CM | POA: Insufficient documentation

## 2019-01-23 DIAGNOSIS — I350 Nonrheumatic aortic (valve) stenosis: Secondary | ICD-10-CM | POA: Insufficient documentation

## 2019-01-23 DIAGNOSIS — R0602 Shortness of breath: Principal | ICD-10-CM

## 2019-01-23 LAB — BASIC METABOLIC PANEL
Anion gap: 9 (ref 5–15)
BUN: 17 mg/dL (ref 8–23)
CO2: 22 mmol/L (ref 22–32)
Calcium: 8.7 mg/dL — ABNORMAL LOW (ref 8.9–10.3)
Chloride: 104 mmol/L (ref 98–111)
Creatinine, Ser: 0.71 mg/dL (ref 0.44–1.00)
GFR calc Af Amer: >60 mL/min (ref >60)
GFR calc non Af Amer: >60 mL/min (ref >60)
Glucose, Bld: 96 mg/dL (ref 70–99)
Potassium: 4 mmol/L (ref 3.5–5.1)
Sodium: 135 mmol/L (ref 135–145)

## 2019-01-23 LAB — CBC WITH DIFFERENTIAL/PLATELET
Abs Immature Granulocytes: 0.05 10*3/uL (ref 0.00–0.07)
Basophils Absolute: 0 10*3/uL (ref 0.0–0.1)
Basophils Relative: 0 %
Eosinophils Absolute: 0.1 10*3/uL (ref 0.0–0.5)
Eosinophils Relative: 1 %
HCT: 32.3 % — ABNORMAL LOW (ref 36.0–46.0)
Hemoglobin: 10.6 g/dL — ABNORMAL LOW (ref 12.0–15.0)
Immature Granulocytes: 1 %
Lymphocytes Relative: 4 %
Lymphs Abs: 0.3 10*3/uL — ABNORMAL LOW (ref 0.7–4.0)
MCH: 29.7 pg (ref 26.0–34.0)
MCHC: 32.8 g/dL (ref 30.0–36.0)
MCV: 90.5 fL (ref 80.0–100.0)
Monocytes Absolute: 0.1 10*3/uL (ref 0.1–1.0)
Monocytes Relative: 1 %
Neutro Abs: 7.2 10*3/uL (ref 1.7–7.7)
Neutrophils Relative %: 93 %
Platelets: 202 10*3/uL (ref 150–400)
RBC: 3.57 MIL/uL — ABNORMAL LOW (ref 3.87–5.11)
RDW: 17.2 % — ABNORMAL HIGH (ref 11.5–15.5)
WBC: 7.8 10*3/uL (ref 4.0–10.5)
nRBC: 0 % (ref 0.0–0.2)

## 2019-01-23 LAB — BRAIN NATRIURETIC PEPTIDE: B Natriuretic Peptide: 211.5 pg/mL — ABNORMAL HIGH (ref 0.0–100.0)

## 2019-01-23 LAB — HEPARIN LEVEL (UNFRACTIONATED): Heparin Unfractionated: 0.33 IU/mL (ref 0.30–0.70)

## 2019-01-23 LAB — GLUCOSE, CAPILLARY
Glucose-Capillary: 79 mg/dL (ref 70–99)
Glucose-Capillary: 91 mg/dL (ref 70–99)
Glucose-Capillary: 97 mg/dL (ref 70–99)

## 2019-01-23 LAB — PROTIME-INR
INR: 1.2 (ref 0.8–1.2)
Prothrombin Time: 14.6 seconds (ref 11.4–15.2)

## 2019-01-23 LAB — ECHOCARDIOGRAM COMPLETE
Height: 66 in
Weight: 2320 oz

## 2019-01-23 LAB — TROPONIN I: Troponin I: 0.07 ng/mL (ref <0.03)

## 2019-01-23 MED ORDER — SENNOSIDES-DOCUSATE SODIUM 8.6-50 MG PO TABS: 2.0000 | ORAL_TABLET | Freq: Every evening | ORAL | Status: DC | PRN

## 2019-01-23 MED ORDER — SODIUM CHLORIDE (PF) 0.9 % IJ SOLN
INTRAMUSCULAR | Status: AC
  Filled 2019-01-23: qty 50

## 2019-01-23 MED ORDER — ACETAMINOPHEN 650 MG RE SUPP: 650.0000 mg | Freq: Four times a day (QID) | RECTAL | Status: DC | PRN

## 2019-01-23 MED ORDER — SODIUM CHLORIDE 0.9 % IV SOLN
INTRAVENOUS | Status: AC
  Administered 2019-01-23: 10:00:00 via INTRAVENOUS

## 2019-01-23 MED ORDER — INSULIN ASPART 100 UNIT/ML ~~LOC~~ SOLN: 0.0000 [IU] | Freq: Three times a day (TID) | SUBCUTANEOUS | Status: DC

## 2019-01-23 MED ORDER — SODIUM CHLORIDE 0.9 % IV BOLUS
500.0000 mL | Freq: Once | INTRAVENOUS | Status: AC
  Administered 2019-01-23: 500 mL via INTRAVENOUS

## 2019-01-23 MED ORDER — POLYETHYLENE GLYCOL 3350 17 G PO PACK: 17.0000 g | PACK | Freq: Every day | ORAL | Status: DC | PRN

## 2019-01-23 MED ORDER — ENOXAPARIN SODIUM 40 MG/0.4ML ~~LOC~~ SOLN: 40.0000 mg | SUBCUTANEOUS | Status: DC

## 2019-01-23 MED ORDER — HEPARIN (PORCINE) 25000 UT/250ML-% IV SOLN
1000.0000 [IU]/h | INTRAVENOUS | Status: DC
  Administered 2019-01-23: 950 [IU]/h via INTRAVENOUS
  Filled 2019-01-23: qty 250

## 2019-01-23 MED ORDER — ACETAMINOPHEN 325 MG PO TABS: 650.0000 mg | ORAL_TABLET | Freq: Four times a day (QID) | ORAL | Status: DC | PRN

## 2019-01-23 MED ORDER — HEPARIN BOLUS VIA INFUSION
2000.0000 [IU] | Freq: Once | INTRAVENOUS | Status: AC
  Administered 2019-01-23: 2000 [IU] via INTRAVENOUS
  Filled 2019-01-23: qty 2000

## 2019-01-23 MED ORDER — TRAMADOL HCL 50 MG PO TABS: 50.0000 mg | ORAL_TABLET | Freq: Four times a day (QID) | ORAL | Status: DC | PRN

## 2019-01-23 MED ORDER — SODIUM CHLORIDE 0.9% FLUSH: 10.0000 mL | INTRAVENOUS | Status: DC | PRN

## 2019-01-23 MED ORDER — SODIUM CHLORIDE 0.9% FLUSH
10.0000 mL | Freq: Two times a day (BID) | INTRAVENOUS | Status: DC
  Administered 2019-01-24: 10 mL

## 2019-01-23 MED ORDER — ALBUTEROL SULFATE (2.5 MG/3ML) 0.083% IN NEBU: 5.0000 mg | INHALATION_SOLUTION | Freq: Once | RESPIRATORY_TRACT | Status: DC

## 2019-01-23 MED ORDER — DILTIAZEM HCL ER COATED BEADS 180 MG PO CP24
180.0000 mg | ORAL_CAPSULE | Freq: Every day | ORAL | Status: DC
  Administered 2019-01-23 – 2019-01-24 (×2): 180 mg via ORAL
  Filled 2019-01-23 (×2): qty 1

## 2019-01-23 MED ORDER — INSULIN ASPART 100 UNIT/ML ~~LOC~~ SOLN: 0.0000 [IU] | Freq: Every day | SUBCUTANEOUS | Status: DC

## 2019-01-23 MED ORDER — IOHEXOL 350 MG/ML SOLN
100.0000 mL | Freq: Once | INTRAVENOUS | Status: AC | PRN
  Administered 2019-01-23: 100 mL via INTRAVENOUS

## 2019-01-23 MED ORDER — OXYCODONE HCL 5 MG PO TABS: 5.0000 mg | ORAL_TABLET | ORAL | Status: DC | PRN

## 2019-01-23 NOTE — ED Notes (Signed)
Blue top sent to lab for ordered PT/INR

## 2019-01-23 NOTE — ED Notes (Signed)
Attempted to return daughters call, no answer.

## 2019-01-23 NOTE — ED Notes (Signed)
Patient transported to CT 

## 2019-01-23 NOTE — Progress Notes (Signed)
ANTICOAGULATION CONSULT NOTE - Initial Consult  Pharmacy Consult for heparin Indication: pulmonary embolus  Allergies  Allergen Reactions  . Other     Silk fabric-caused Rash    Patient Measurements: Height: 5\' 6"  (167.6 cm) Weight: 145 lb (65.8 kg) IBW/kg (Calculated) : 59.3 Heparin Dosing Weight: 65.8 kg  Vital Signs: Temp: 97.9 F (36.6 C) (05/04 0546) Temp Source: Oral (05/04 0546) BP: 115/64 (05/04 0730) Pulse Rate: 86 (05/04 0642)  Labs: Recent Labs    01/20/19 0845 01/23/19 0612 01/23/19 0735  HGB 11.9*  --  10.6*  HCT 36.1  --  32.3*  PLT 283  --  202  CREATININE 0.76 0.71  --   TROPONINI  --  0.07*  --     Estimated Creatinine Clearance: 43.8 mL/min (by C-G formula based on SCr of 0.71 mg/dL).   Medical History: Past Medical History:  Diagnosis Date  . Aortic stenosis, mild    ECHO 09  . Bilateral cataracts   . DOE (dyspnea on exertion)   . Facial basal cell cancer    nose-/dr, Allyson Sabal  . Family history of breast cancer   . Family history of colon cancer   . Family history of malignant melanoma   . Hearing loss in left ear    resolved after stapedectomy  . Heart murmur   . History of colon polyps   . Hyperlipidemia   . Hypertension   . Hypertrophic obstructive cardiomyopathy ([place])   . Mass of pancreas   . OA (osteoarthritis)   . Osteoporosis   . Retinal vein occlusion    Dr. Starling Manns, Left eye, receives injections  . Varicose veins   . Vitamin D deficiency     Assessment: 85 yo F w/ pancreatic cancer on chemo with new PE. SCr 0.71, Hg 10.6, PLTC WNL.   Goal of Therapy:  Heparin level 0.3-0.7 units/ml Monitor platelets by anticoagulation protocol: Yes   Plan:  Give 2000 units bolus x 1 Start heparin infusion at 950 units/hr Check anti-Xa level in 8 hours and daily while on heparin Continue to monitor H&H and platelets  Eudelia Bunch, Pharm.D 01/23/2019 8:21 AM

## 2019-01-23 NOTE — ED Triage Notes (Signed)
Patient is complaining of sob. She states when she woke up she could not catch her breath. Patient states this has happened to her before. Patient has pancreatic cancer. Last chemo was last Friday.

## 2019-01-23 NOTE — ED Notes (Addendum)
Date and time results received: 01/23/19 7:16 AM  (use smartphrase ".now" to insert current time)  Test: troponin Critical Value:0.07  Name of Provider Notified: Caryl Pina primary Rn   Orders Received? Or Actions Taken?:

## 2019-01-23 NOTE — Progress Notes (Signed)
  Echocardiogram 2D Echocardiogram has been performed.  Tiffany [name] 01/23/2019, 11:10 AM

## 2019-01-23 NOTE — Progress Notes (Addendum)
HEMATOLOGY-ONCOLOGY PROGRESS NOTE  SUBJECTIVE: Ms. Yetman is a 85 year old female with metastatic pancreatic cancer.  She is currently receiving treatment with gemcitabine and Abraxane.  Received her last dose on 01/20/2019.  She was admitted to the hospital with bilateral pulmonary emboli.  The patient reports that she became acutely short of breath last evening and came into the emergency room.  She also reported having a cough and felt anxious.  She denied having chest pain.  She had a CT angiogram of the chest performed in the emergency room which demonstrated multifocal acute pulmonary embolism.  She has been started on a heparin drip.  The patient reports that she is having some improvement in her breathing and shortness of breath.  She again denies chest pain.  Denies lower extremity edema.  She has no other complaints today.    Malignant neoplasm of body of pancreas ([place])   12/15/2018 Initial Diagnosis    Malignant neoplasm of body of pancreas ([place])    12/15/2018 Cancer Staging    Staging form: Exocrine Pancreas, AJCC 8th Edition - Clinical: Stage IV (cT2, cN0, pM1) - Signed by Ladell Pier, MD on 12/15/2018    12/22/2018 -  Chemotherapy    The patient had gemcitabine (GEMZAR) 1,330 mg in sodium chloride 0.9 % 250 mL chemo infusion, 800 mg/m2 = 1,330 mg (100 % of original dose 800 mg/m2), Intravenous,  Once, 2 of 4 cycles Dose modification: 800 mg/m2 (original dose 800 mg/m2, Cycle 1, Reason: Provider Judgment) Administration: 1,330 mg (12/22/2018), 1,330 mg (01/05/2019), 1,330 mg (01/20/2019) PACLitaxel-protein bound (ABRAXANE) chemo infusion 175 mg, 100 mg/m2 = 175 mg (100 % of original dose 100 mg/m2), Intravenous, Once, 2 of 4 cycles Dose modification: 100 mg/m2 (original dose 100 mg/m2, Cycle 1, Reason: Provider Judgment) Administration: 175 mg (12/22/2018), 175 mg (01/05/2019)  for chemotherapy treatment.       REVIEW OF SYSTEMS:   Constitutional: Denies fevers, chills.  Reports her  appetite is fair. Eyes: Denies blurriness of vision Ears, nose, mouth, throat, and face: Denies mucositis or sore throat Respiratory: Has an intermittent nonproductive cough.  Had shortness of breath last night but improved today. Cardiovascular: Denies palpitation, chest discomfort Gastrointestinal:  Denies nausea, heartburn or change in bowel habits Skin: Denies abnormal skin rashes Lymphatics: Denies new lymphadenopathy or easy bruising Neurological:Denies numbness, tingling or new weaknesses Behavioral/Psych: Mood is stable, no new changes  Extremities: No lower extremity edema All other systems were reviewed with the patient and are negative.  I have reviewed the past medical history, past surgical history, social history and family history with the patient and they are unchanged from previous note.   PHYSICAL EXAMINATION: ECOG PERFORMANCE STATUS: 1 - Symptomatic but completely ambulatory  Vitals:   01/23/19 0857 01/23/19 0921  BP:  128/60  Pulse:  89  Resp: (!) 25 20  Temp:  97.9 F (36.6 C)  SpO2: 97% 98%   Filed Weights   01/23/19 0545  Weight: 145 lb (65.8 kg)    Intake/Output from previous day: No intake/output data recorded.  GENERAL:alert, no distress and comfortable SKIN: skin color, texture, turgor are normal, no rashes or significant lesions EYES: normal, Conjunctiva are pink and non-injected, sclera clear OROPHARYNX:no exudate, no erythema and lips, buccal mucosa, and tongue normal  NECK: supple, thyroid normal size, non-tender, without nodularity LYMPH:  no palpable lymphadenopathy in the cervical, axillary or inguinal LUNGS: Rales to the bilateral bases with normal breathing effort HEART: regular rate & rhythm and no murmurs and  no lower extremity edema ABDOMEN:abdomen soft, non-tender and normal bowel sounds Musculoskeletal:no cyanosis of digits and no clubbing  NEURO: alert & oriented x 3 with fluent speech, no focal motor/sensory  deficits  LABORATORY DATA:  I have reviewed the data as listed CMP Latest Ref Rng & Units 01/23/2019 01/20/2019 01/05/2019  Glucose 70 - 99 mg/dL 96 135(H) 116(H)  BUN 8 - 23 mg/dL 17 15 12   Creatinine 0.44 - 1.00 mg/dL 0.71 0.76 0.77  Sodium 135 - 145 mmol/L 135 139 134(L)  Potassium 3.5 - 5.1 mmol/L 4.0 3.8 4.3  Chloride 98 - 111 mmol/L 104 104 101  CO2 22 - 32 mmol/L 22 28 24   Calcium 8.9 - 10.3 mg/dL 8.7(L) 9.2 9.0  Total Protein 6.5 - 8.1 g/dL - 5.3(L) 5.9(L)  Total Bilirubin 0.3 - 1.2 mg/dL - 0.5 0.5  Alkaline Phos 38 - 126 U/L - 69 85  AST 15 - 41 U/L - 23 32  ALT 0 - 44 U/L - 23 31    Lab Results  Component Value Date   WBC 7.8 01/23/2019   HGB 10.6 (L) 01/23/2019   HCT 32.3 (L) 01/23/2019   MCV 90.5 01/23/2019   PLT 202 01/23/2019   NEUTROABS 7.2 01/23/2019    Dg Chest 2 View  Result Date: 01/23/2019 CLINICAL DATA:  Shortness of breath EXAM: CHEST - 2 VIEW COMPARISON:  Chest CT 12/06/2018 FINDINGS: Multiple pulmonary nodules consistent with metastatic disease, known. No convincing acute airspace opacity is superimposed. Normal heart size. Rightward tracheal deviation from large intrathoracic thyroid nodule. Right-sided port with tip at the SVC. IMPRESSION: Known pulmonary metastases. No evidence of acute superimposed disease. Electronically Signed   By: Monte Fantasia M.D.   On: 01/23/2019 06:10   Ct Angio Chest Pe W Or Wo Contrast  Result Date: 01/23/2019 CLINICAL DATA:  Shortness of breath.  Pancreas cancer EXAM: CT ANGIOGRAPHY CHEST WITH CONTRAST TECHNIQUE: Multidetector CT imaging of the chest was performed using the standard protocol during bolus administration of intravenous contrast. Multiplanar CT image reconstructions and MIPs were obtained to evaluate the vascular anatomy. CONTRAST:  111mL OMNIPAQUE IOHEXOL 350 MG/ML SOLN COMPARISON:  12/06/2018 FINDINGS: Cardiovascular: Branching central pulmonary emboli seen in the lobar and segmental vessels of the right middle and  right lower lobes. Segmental to subsegmental emboli seen in the left lower lobe. RV to LV ratio is normal at 0.8. No cardiomegaly or pericardial effusion. There is extensive aortic and coronary atherosclerosis. Mediastinum/Nodes: Large left thyroid nodule extending into the mediastinum and displacing the trachea with mild narrowing. Lungs/Pleura: Ill-defined metastases scattered throughout the lungs. A 3 cm mass in the right upper lobe has enlarged since 12/06/2018 when it measured 22 mm. There is segmental ground-glass density in the right lower lobe which is likely lung infarct. Small right pleural effusion without pleural nodularity. Upper Abdomen: Known omental nodularity Musculoskeletal: No acute finding Critical Value/emergent results were called by telephone at the time of interpretation on 01/23/2019 at 8:17 am to Dr. Myrene Buddy , who verbally acknowledged these results. Review of the MIP images confirms the above findings. IMPRESSION: 1. Multifocal acute pulmonary embolism, lobar size on the right where there is a lower lobe infarct. 2. Pulmonary metastatic disease with progression since 12/06/2018. Electronically Signed   By: Monte Fantasia M.D.   On: 01/23/2019 08:20    ASSESSMENT AND PLAN: 1. Pancreas mass/lung nodules/peritoneal nodules  Abdominal ultrasound 11/25/2018-1.0 cm pancreatic head cyst and a 5.5 mm lymph node adjacent to the pancreatic head.  MRI abdomen 11/25/2018-irregular poorly marginated hypoenhancing 3.2 x 2.2 cm posterior pancreatic body mass encasing the celiac artery; separate 1.4 cm pancreatic neck and 1.2 cm posterior pancreatic head cystic masses; multiple lung masses/pulmonary nodules scattered throughout both lung bases measuring up to 3.5 cm; extensive peritoneal carcinomatosis with soft tissue metastases throughout the omentum and perihepatic space; trace ascites.  CTs 12/06/2018-large left thyroid nodule, bilateral pulmonary nodules, pancreas body/tail mass with  involvement of the celiac axis, peritoneal nodularity, omental caking at the anterior abdominal wall  CT biopsy of an omental nodule 12/12/2018-metastatic adenocarcinoma consistent with pancreas cancer  Cycle 1 gemcitabine/Abraxane 12/22/2018  Cycle 2 gemcitabine/Abraxane 01/05/2019  Common hereditary cancer panel-negative  Cycle 3 gemcitabine/Abraxane 01/20/2019 2. Abdominal pain secondary to #1-improved 3. Hypertension 4. Hypertrophic obstructive cardiomyopathy 5. Cough-likely secondary to pulmonary metastases 6. Hypercalcemia 12/22/2018 status post Zometa 7. Anorexia secondary to #1 8. Multifocal acute pulmonary emboli diagnosed in May 2020  Ms. Vise is a 85 year old female with metastatic pancreatic cancer currently undergoing treatment with gemcitabine and Abraxane.  Last chemotherapy was given on 01/20/2019.  She now presents with increasing shortness of breath and has been diagnosed with acute pulmonary emboli.  She is on a heparin drip.  1.  Continue heparin drip with plans to transition the patient to Lovenox versus Xarelto.  These options were discussed with the patient and she prefers to take an oral medication as opposed to an injection. 2.  There is a slight increase in the right upper lobe lung mass.  The patient they have had mild disease progression prior to getting started on chemotherapy.  We will watch this on upcoming scans. 3.  She is noted to have mild anemia likely due to her recent chemotherapy.  Continue to monitor CBC and transfuse if her hemoglobin is less than 7.0 or active bleeding.    LOS: 0 days   Mikey Bussing, DNP, AGPCNP-BC, AOCNP 01/23/19   Ms. Jelinek was interviewed and examined.  She was admitted earlier today with dyspnea.  A CT of the chest confirmed pulmonary emboli.  She reports feeling better at present.  She has completed 3 treatments with gemcitabine/Abraxane for metastatic pancreas cancer.  Her overall performance status has improved over the past  week, but she remains debilitated from the pancreas cancer. We discussed anticoagulation options with Ms. Tahir.  She prefers an oral anticoagulant as opposed to Lovenox. I recommend continuing heparin anticoagulation for at least 2 days prior to transitioning to Xarelto or Eliquis.  Outpatient follow-up is scheduled at the Cancer center. I appreciate the care from the medicine service.

## 2019-01-23 NOTE — ED Notes (Signed)
Patient transported to X-ray 

## 2019-01-23 NOTE — H&P (Signed)
History and Physical    Tiffany [name] HFW:[id number] DOB: 12/02/1925 DOA: 01/23/2019  PCP: Lawerance Cruel, MD Patient coming from: Home  Chief Complaint: Shortness of breath  HPI: Tiffany [name] is a 85 y.o. female with medical history significant of pancreatic cancer on chemotherapy, hypertension, Came to the hospital for evaluation of shortness of breath.  Patient lives at home alone but recently her daughter has moved in with her.  Up until last night she was doing well and otherwise independent.  This morning she woke up with shortness of breath with some chest discomfort.  Initially she called EMS who came evaluated her but even after them leaving she felt short of breath therefore came to the hospital for further evaluation.  Denies any fevers, chills, palpitations, lightheadedness, dizziness and other complaints.  Denies any previous history of blood clots Diagnosed with pancreatic cancer about 2 months ago. In the ER CT of the chest showed multifocal acute pulmonary embolism, lobar size on the right.  Pulmonary metastatic disease.  Although she is hemodynamically stable, given her age and comorbidities, she was admitted for observation on heparin drip.   Review of Systems: As per HPI otherwise 10 point review of systems negative.  Review of Systems Otherwise negative except as per HPI, including: General: Denies fever, chills, night sweats or unintended weight loss. Resp: Denies cough, wheezing Cardiac: Denies chest pain, palpitations, orthopnea, paroxysmal nocturnal dyspnea. GI: Denies abdominal pain, nausea, vomiting, diarrhea or constipation GU: Denies dysuria, frequency, hesitancy or incontinence MS: Denies muscle aches, joint pain or swelling Neuro: Denies headache, neurologic deficits (focal weakness, numbness, tingling), abnormal gait Psych: Denies anxiety, depression, SI/HI/AVH Skin: Denies new rashes or lesions ID: Denies sick contacts, exotic exposures, travel  Past  Medical History:  Diagnosis Date  . Aortic stenosis, mild    ECHO 09  . Bilateral cataracts   . DOE (dyspnea on exertion)   . Facial basal cell cancer    nose-/dr, Allyson Sabal  . Family history of breast cancer   . Family history of colon cancer   . Family history of malignant melanoma   . Hearing loss in left ear    resolved after stapedectomy  . Heart murmur   . History of colon polyps   . Hyperlipidemia   . Hypertension   . Hypertrophic obstructive cardiomyopathy ([place])   . Mass of pancreas   . OA (osteoarthritis)   . Osteoporosis   . Retinal vein occlusion    Dr. Starling Manns, Left eye, receives injections  . Varicose veins   . Vitamin D deficiency     Past Surgical History:  Procedure Laterality Date  . COLONOSCOPY  04/2000  . COLONOSCOPY W/ POLYPECTOMY  1988  . CYST EXCISION N/A 04/07/2018   Procedure: EXCISION EPIDERMAL CYST POSTERIOR NECK;  Surgeon: Armandina Gemma, MD;  Location: [facility];  Service: General;  Laterality: N/A;  . ELBOW SURGERY    . EXTERNAL EAR SURGERY Right 1974   Stapedectomy  . EYE SURGERY Bilateral    cataract  . IR IMAGING GUIDED PORT INSERTION  12/19/2018  . SHOULDER SURGERY    . VAGINAL HYSTERECTOMY     without oopherectomy for birth control age 78    SOCIAL HISTORY:  reports that she has never smoked. She has never used smokeless tobacco. She reports current alcohol use. She reports that she does not use drugs.  Allergies  Allergen Reactions  . Other     Silk fabric-caused Rash    FAMILY  HISTORY: Family History  Problem Relation Age of Onset  . Melanoma Mother        dx 65s, d. 23  . Cerebral aneurysm Mother   . Hypertension Mother   . CAD Father   . Breast cancer Sister 15  . Colon cancer Brother 70  . Hypertension Brother   . Hypertension Son   . Hypertension Son      Prior to Admission medications   Medication Sig Start Date End Date Taking? Authorizing Provider  Cholecalciferol (VITAMIN D) 2000 units  CAPS Take 1 capsule by mouth daily.   Yes [provider]  diltiazem (CARDIZEM CD) 180 MG 24 hr capsule Take 1 capsule (180 mg total) by mouth daily. 04/12/18  Yes Belva Crome, MD  lidocaine-prilocaine (EMLA) cream Apply 1 application topically as needed. 12/15/18  Yes Ladell Pier, MD  Misc Natural Products (OSTEO BI-FLEX ADV DOUBLE ST PO) Take 1 tablet by mouth daily.   Yes [provider]  Multiple Vitamins-Minerals (PRESERVISION AREDS) CAPS Take 1 capsule by mouth daily.   Yes [provider]  Polyethylene Glycol 3350 (MIRALAX PO) Take 17 g by mouth daily as needed (constipation).  12/15/18  Yes Ladell Pier, MD  prochlorperazine (COMPAZINE) 5 MG tablet Take 1 tablet (5 mg total) by mouth every 6 (six) hours as needed for nausea or vomiting. 12/15/18  Yes Ladell Pier, MD  fluconazole (DIFLUCAN) 50 MG tablet Take 1 tablet (50 mg total) by mouth daily for 5 days. 01/20/19 01/25/19  Owens Shark, NP  predniSONE (DELTASONE) 10 MG tablet Take 1 tablet (10 mg total) by mouth daily with breakfast. Patient not taking: Reported on 01/23/2019 01/05/19   Owens Shark, NP  traMADol (ULTRAM) 50 MG tablet Take 1 tablet (50 mg total) by mouth every 6 (six) hours as needed. Patient not taking: Reported on 01/23/2019 12/22/18   Ladell Pier, MD  traMADol-acetaminophen (ULTRACET) 37.5-325 MG tablet Take 1 tablet by mouth every 8 (eight) hours as needed. Patient not taking: Reported on 01/23/2019 01/05/19   Owens Shark, NP    Physical Exam: Vitals:   01/23/19 2440 01/23/19 0857 01/23/19 0921 01/23/19 1312  BP: 129/62  128/60 125/61  Pulse:   89 93  Resp: (!) 24 (!) 25 20 20   Temp:   97.9 F (36.6 C) 98.1 F (36.7 C)  TempSrc:   Oral Oral  SpO2: 92% 97% 98% 96%  Weight:      Height:          Constitutional: NAD, calm, comfortable Eyes: PERRL, lids and conjunctivae normal ENMT: Mucous membranes are moist. Posterior pharynx clear of any exudate or lesions.Normal  dentition.  Neck: normal, supple, no masses, no thyromegaly Respiratory: clear to auscultation bilaterally, no wheezing, no crackles. Normal respiratory effort. No accessory muscle use.  Cardiovascular: Regular rate and rhythm, no murmurs / rubs / gallops. No extremity edema. 2+ pedal pulses. No carotid bruits.  Abdomen: no tenderness, no masses palpated. No hepatosplenomegaly. Bowel sounds positive.  Musculoskeletal: no clubbing / cyanosis. No joint deformity upper and lower extremities. Good ROM, no contractures. Normal muscle tone.  Skin: no rashes, lesions, ulcers. No induration Neurologic: CN 2-12 grossly intact. Sensation intact, DTR normal. Strength 5/5 in all 4.  Psychiatric: Normal judgment and insight. Alert and oriented x 3. Normal mood.     Labs on Admission: I have personally reviewed following labs and imaging studies  CBC: Recent Labs  Lab 01/20/19 0845 01/23/19 1027  WBC 8.3 7.8  NEUTROABS 6.3 7.2  HGB 11.9* 10.6*  HCT 36.1 32.3*  MCV 88.0 90.5  PLT 283 119   Basic Metabolic Panel: Recent Labs  Lab 01/20/19 0845 01/23/19 0612  NA 139 135  K 3.8 4.0  CL 104 104  CO2 28 22  GLUCOSE 135* 96  BUN 15 17  CREATININE 0.76 0.71  CALCIUM 9.2 8.7*   GFR: Estimated Creatinine Clearance: 43.8 mL/min (by C-G formula based on SCr of 0.71 mg/dL). Liver Function Tests: Recent Labs  Lab 01/20/19 0845  AST 23  ALT 23  ALKPHOS 69  BILITOT 0.5  PROT 5.3*  ALBUMIN 2.8*   No results for input(s): LIPASE, AMYLASE in the last 168 hours. No results for input(s): AMMONIA in the last 168 hours. Coagulation Profile: Recent Labs  Lab 01/23/19 0844  INR 1.2   Cardiac Enzymes: Recent Labs  Lab 01/23/19 0612  TROPONINI 0.07*   BNP (last 3 results) No results for input(s): PROBNP in the last 8760 hours. HbA1C: No results for input(s): HGBA1C in the last 72 hours. CBG: Recent Labs  Lab 01/23/19 1240 01/23/19 1648  GLUCAP 79 91   Lipid Profile: No results  for input(s): CHOL, HDL, LDLCALC, TRIG, CHOLHDL, LDLDIRECT in the last 72 hours. Thyroid Function Tests: No results for input(s): TSH, T4TOTAL, FREET4, T3FREE, THYROIDAB in the last 72 hours. Anemia Panel: No results for input(s): VITAMINB12, FOLATE, FERRITIN, TIBC, IRON, RETICCTPCT in the last 72 hours. Urine analysis:    Component Value Date/Time   COLORURINE YELLOW 11/25/2018 [place] 11/25/2018 1644   LABSPEC 1.015 11/25/2018 1644   PHURINE 6.0 11/25/2018 1644   GLUCOSEU NEGATIVE 11/25/2018 1644   HGBUR NEGATIVE 11/25/2018 [place] 11/25/2018 1644   KETONESUR 5 (A) 11/25/2018 1644   PROTEINUR NEGATIVE 11/25/2018 1644   NITRITE NEGATIVE 11/25/2018 1644   LEUKOCYTESUR MODERATE (A) 11/25/2018 1644   Sepsis Labs: !!!!!!!!!!!!!!!!!!!!!!!!!!!!!!!!!!!!!!!!!!!! @LABRCNTIP (procalcitonin:4,lacticidven:4) )No results found for this or any previous visit (from the past 240 hour(s)).   Radiological Exams on Admission: Dg Chest 2 View  Result Date: 01/23/2019 CLINICAL DATA:  Shortness of breath EXAM: CHEST - 2 VIEW COMPARISON:  Chest CT 12/06/2018 FINDINGS: Multiple pulmonary nodules consistent with metastatic disease, known. No convincing acute airspace opacity is superimposed. Normal heart size. Rightward tracheal deviation from large intrathoracic thyroid nodule. Right-sided port with tip at the SVC. IMPRESSION: Known pulmonary metastases. No evidence of acute superimposed disease. Electronically Signed   By: Monte Fantasia M.D.   On: 01/23/2019 06:10   Ct Angio Chest Pe W Or Wo Contrast  Result Date: 01/23/2019 CLINICAL DATA:  Shortness of breath.  Pancreas cancer EXAM: CT ANGIOGRAPHY CHEST WITH CONTRAST TECHNIQUE: Multidetector CT imaging of the chest was performed using the standard protocol during bolus administration of intravenous contrast. Multiplanar CT image reconstructions and MIPs were obtained to evaluate the vascular anatomy. CONTRAST:  163mL  OMNIPAQUE IOHEXOL 350 MG/ML SOLN COMPARISON:  12/06/2018 FINDINGS: Cardiovascular: Branching central pulmonary emboli seen in the lobar and segmental vessels of the right middle and right lower lobes. Segmental to subsegmental emboli seen in the left lower lobe. RV to LV ratio is normal at 0.8. No cardiomegaly or pericardial effusion. There is extensive aortic and coronary atherosclerosis. Mediastinum/Nodes: Large left thyroid nodule extending into the mediastinum and displacing the trachea with mild narrowing. Lungs/Pleura: Ill-defined metastases scattered throughout the lungs. A 3 cm mass in the right upper lobe has enlarged since 12/06/2018 when it measured  22 mm. There is segmental ground-glass density in the right lower lobe which is likely lung infarct. Small right pleural effusion without pleural nodularity. Upper Abdomen: Known omental nodularity Musculoskeletal: No acute finding Critical Value/emergent results were called by telephone at the time of interpretation on 01/23/2019 at 8:17 am to Dr. Myrene Buddy , who verbally acknowledged these results. Review of the MIP images confirms the above findings. IMPRESSION: 1. Multifocal acute pulmonary embolism, lobar size on the right where there is a lower lobe infarct. 2. Pulmonary metastatic disease with progression since 12/06/2018. Electronically Signed   By: Monte Fantasia M.D.   On: 01/23/2019 08:20     All images have been reviewed by me personally.    Assessment/Plan Principal Problem:   Bilateral pulmonary embolism (HCC) Active Problems:   Shortness of breath   Hypertrophic obstructive cardiomyopathy (HCC)   Essential hypertension   Malignant neoplasm of body of pancreas (HCC)    Acute bilateral pulmonary embolism without cor pulmonale - Admit the patient for observation.  No current signs of hemodynamic instability.  Oxygen saturation remains well over 90% on room air - Heparin drip for now, transition to oral over next 24 hours -Check  echocardiogram. - Supportive care  Metastatic pancreatic cancer on chemotherapy - Follows outpatient oncology.  Currently getting outpatient chemotherapy.  Essential hypertension -On Cardizem 180 mg daily    DVT prophylaxis: Heparin drip Code Status: Full code Family Communication: None Disposition Plan: Likely home tomorrow if her breathing remained stable Consults called: None Admission status: Observation telemetry   Time Spent: 65 minutes.  >50% of the time was devoted to discussing the patients care, assessment, plan and disposition with other care givers along with counseling the patient about the risks and benefits of treatment.    Xanthe Couillard Arsenio Loader MD [facility]  If 7PM-7AM, please contact night-coverage [email or web address]  01/23/2019, 5:16 PM

## 2019-01-23 NOTE — ED Notes (Signed)
ED TO INPATIENT HANDOFF REPORT  Name/Age/Gender Tiffany [name] 85 y.o. female  Code Status   Home/SNF/Other Home  Chief Complaint Shortness of Breath;Chemo pt  Level of Care/Admitting Diagnosis ED Disposition    ED Disposition Condition Comment   Admit  Hospital Area: [facility] [[id number]]  Level of Care: Telemetry [5]  Admit to tele based on following criteria: Monitor for Ischemic changes  Covid Evaluation: Screening Protocol (No Symptoms)  Diagnosis: Acute pulmonary embolism [facility]) [[id number]]  Admitting Physician: Gerlean Ren [facility] [[id number]]  Attending Physician: Damita Lack [[id number]]  PT Class (Do Not Modify): Observation [104]  PT Acc Code (Do Not Modify): Observation [10022]       Medical History Past Medical History:  Diagnosis Date  . Aortic stenosis, mild    ECHO 09  . Bilateral cataracts   . DOE (dyspnea on exertion)   . Facial basal cell cancer    nose-/dr, Allyson Sabal  . Family history of breast cancer   . Family history of colon cancer   . Family history of malignant melanoma   . Hearing loss in left ear    resolved after stapedectomy  . Heart murmur   . History of colon polyps   . Hyperlipidemia   . Hypertension   . Hypertrophic obstructive cardiomyopathy ([place])   . Mass of pancreas   . OA (osteoarthritis)   . Osteoporosis   . Retinal vein occlusion    Dr. Starling Manns, Left eye, receives injections  . Varicose veins   . Vitamin D deficiency     Allergies Allergies  Allergen Reactions  . Other     Silk fabric-caused Rash    IV Location/Drains/Wounds Patient Lines/Drains/Airways Status   Active Line/Drains/Airways    Name:   Placement date:   Placement time:   Site:   Days:   Implanted Port 12/19/18 Right Chest   12/19/18    1008    Chest   35   Peripheral IV 11/25/18 Left Forearm   11/25/18    1525    Forearm   59   Peripheral IV 01/23/19 Right Forearm   01/23/19    0620    Forearm   less than 1   Incision (Closed)  04/07/18 Neck Other (Comment)   04/07/18    1115     291   Incision (Closed) 12/12/18 Abdomen Left   12/12/18    1225     42          Labs/Imaging Results for orders placed or performed during the hospital encounter of 01/23/19 (from the past 48 hour(s))  Basic metabolic panel     Status: Abnormal   Collection Time: 01/23/19  6:12 AM  Result Value Ref Range   Sodium 135 135 - 145 mmol/L   Potassium 4.0 3.5 - 5.1 mmol/L   Chloride 104 98 - 111 mmol/L   CO2 22 22 - 32 mmol/L   Glucose, Bld 96 70 - 99 mg/dL   BUN 17 8 - 23 mg/dL   Creatinine, Ser 0.71 0.44 - 1.00 mg/dL   Calcium 8.7 (L) 8.9 - 10.3 mg/dL   GFR calc non Af Amer >60 >60 mL/min   GFR calc Af Amer >60 >60 mL/min   Anion gap 9 5 - 15    Comment: Performed at [facility], [place] [address] [place],  [address]  Troponin I - ONCE - STAT     Status: Abnormal   Collection Time: 01/23/19  6:12  AM  Result Value Ref Range   Troponin I 0.07 (HH) <0.03 ng/mL    Comment: CRITICAL RESULT CALLED TO, READ BACK BY AND VERIFIED WITH: HALL,C.RN AT 0715 01/23/19 MULLINS,T Performed at [facility], [place] [address] [place], [place] [address]   Brain natriuretic peptide     Status: Abnormal   Collection Time: 01/23/19  6:12 AM  Result Value Ref Range   B Natriuretic Peptide 211.5 (H) 0.0 - 100.0 pg/mL    Comment: Performed at [facility], [place] [address], [place], [place] [address]  CBC with Differential/Platelet     Status: Abnormal   Collection Time: 01/23/19  7:35 AM  Result Value Ref Range   WBC 7.8 4.0 - 10.5 K/uL   RBC 3.57 (L) 3.87 - 5.11 MIL/uL   Hemoglobin 10.6 (L) 12.0 - 15.0 g/dL   HCT 32.3 (L) 36.0 - 46.0 %   MCV 90.5 80.0 - 100.0 fL   MCH 29.7 26.0 - 34.0 pg   MCHC 32.8 30.0 - 36.0 g/dL   RDW 17.2 (H) 11.5 - 15.5 %   Platelets 202 150 - 400 K/uL   nRBC 0.0 0.0 - 0.2 %   Neutrophils Relative % 93 %   Neutro Abs 7.2 1.7 - 7.7 K/uL   Lymphocytes Relative  4 %   Lymphs Abs 0.3 (L) 0.7 - 4.0 K/uL   Monocytes Relative 1 %   Monocytes Absolute 0.1 0.1 - 1.0 K/uL   Eosinophils Relative 1 %   Eosinophils Absolute 0.1 0.0 - 0.5 K/uL   Basophils Relative 0 %   Basophils Absolute 0.0 0.0 - 0.1 K/uL   Immature Granulocytes 1 %   Abs Immature Granulocytes 0.05 0.00 - 0.07 K/uL   Acanthocytes PRESENT     Comment: Performed at [facility], [place] [address] [place], [place] [address]   Dg Chest 2 View  Result Date: 01/23/2019 CLINICAL DATA:  Shortness of breath EXAM: CHEST - 2 VIEW COMPARISON:  Chest CT 12/06/2018 FINDINGS: Multiple pulmonary nodules consistent with metastatic disease, known. No convincing acute airspace opacity is superimposed. Normal heart size. Rightward tracheal deviation from large intrathoracic thyroid nodule. Right-sided port with tip at the SVC. IMPRESSION: Known pulmonary metastases. No evidence of acute superimposed disease. Electronically Signed   By: Monte Fantasia M.D.   On: 01/23/2019 06:10   Ct Angio Chest Pe W Or Wo Contrast  Result Date: 01/23/2019 CLINICAL DATA:  Shortness of breath.  Pancreas cancer EXAM: CT ANGIOGRAPHY CHEST WITH CONTRAST TECHNIQUE: Multidetector CT imaging of the chest was performed using the standard protocol during bolus administration of intravenous contrast. Multiplanar CT image reconstructions and MIPs were obtained to evaluate the vascular anatomy. CONTRAST:  186mL OMNIPAQUE IOHEXOL 350 MG/ML SOLN COMPARISON:  12/06/2018 FINDINGS: Cardiovascular: Branching central pulmonary emboli seen in the lobar and segmental vessels of the right middle and right lower lobes. Segmental to subsegmental emboli seen in the left lower lobe. RV to LV ratio is normal at 0.8. No cardiomegaly or pericardial effusion. There is extensive aortic and coronary atherosclerosis. Mediastinum/Nodes: Large left thyroid nodule extending into the mediastinum and displacing the trachea with mild narrowing.  Lungs/Pleura: Ill-defined metastases scattered throughout the lungs. A 3 cm mass in the right upper lobe has enlarged since 12/06/2018 when it measured 22 mm. There is segmental ground-glass density in the right lower lobe which is likely lung infarct. Small right pleural effusion without pleural nodularity. Upper Abdomen: Known omental nodularity Musculoskeletal: No acute  finding Critical Value/emergent results were called by telephone at the time of interpretation on 01/23/2019 at 8:17 am to Dr. Myrene Buddy , who verbally acknowledged these results. Review of the MIP images confirms the above findings. IMPRESSION: 1. Multifocal acute pulmonary embolism, lobar size on the right where there is a lower lobe infarct. 2. Pulmonary metastatic disease with progression since 12/06/2018. Electronically Signed   By: Monte Fantasia M.D.   On: 01/23/2019 08:20    Pending Labs Unresulted Labs (From admission, onward)    Start     Ordered   01/24/19 0500  CBC  Daily,   R    Comments:  While on heparin drip    01/23/19 0820   01/23/19 0844  Protime-INR  ONCE - STAT,   STAT     01/23/19 0844   01/23/19 0612  CBC with Differential/Platelet  ONCE - STAT,   R     01/23/19 0611          Vitals/Pain Today's Vitals   01/23/19 0642 01/23/19 0730 01/23/19 0746 01/23/19 0830  BP: 107/65 115/64  (!) 123/57  Pulse: 86   86  Resp: (!) 24 (!) 26  (!) 23  Temp:      TempSrc:      SpO2: 100% 100%  93%  Weight:      Height:      PainSc:   0-No pain     Isolation Precautions No active isolations  Medications Medications  albuterol (PROVENTIL) (2.5 MG/3ML) 0.083% nebulizer solution 5 mg (0 mg Nebulization Hold 01/23/19 0608)  sodium chloride (PF) 0.9 % injection (has no administration in time range)  heparin ADULT infusion 100 units/mL (25000 units/280mL sodium chloride 0.45%) (has no administration in time range)  heparin bolus via infusion 2,000 Units (has no administration in time range)  sodium chloride 0.9 %  bolus 500 mL (0 mLs Intravenous Stopped 01/23/19 0842)  iohexol (OMNIPAQUE) 350 MG/ML injection 100 mL (100 mLs Intravenous Contrast Given 01/23/19 0756)    Mobility walks with person assist

## 2019-01-23 NOTE — ED Provider Notes (Addendum)
[facility] DEPT Provider Note   CSN: [id number] Arrival date & time: 01/23/19  0536    History   Chief Complaint Chief Complaint  Patient presents with  . Shortness of Breath    HPI Tiffany [name] is a 85 y.o. female.     Patient presents to the emergency department from home with complaints of shortness of breath.  She reports that she suddenly became short of breath while in bed tonight.  Patient appears anxious at arrival.  She does endorse a slight cough.  She has not had any fever.  She denies chest pain.     Past Medical History:  Diagnosis Date  . Aortic stenosis, mild    ECHO 09  . Bilateral cataracts   . DOE (dyspnea on exertion)   . Facial basal cell cancer    nose-/dr, Allyson Sabal  . Family history of breast cancer   . Family history of colon cancer   . Family history of malignant melanoma   . Hearing loss in left ear    resolved after stapedectomy  . Heart murmur   . History of colon polyps   . Hyperlipidemia   . Hypertension   . Hypertrophic obstructive cardiomyopathy ([place])   . Mass of pancreas   . OA (osteoarthritis)   . Osteoporosis   . Retinal vein occlusion    Dr. Starling Manns, Left eye, receives injections  . Varicose veins   . Vitamin D deficiency     Patient Active Problem List   Diagnosis Date Noted  . Bilateral pulmonary embolism ([place]) 01/23/2019  . Malignant neoplasm of body of pancreas ([place]) 12/15/2018  . Goals of care, counseling/discussion 12/15/2018  . Genetic testing 12/13/2018  . Family history of breast cancer   . Family history of colon cancer   . Family history of malignant melanoma   . Mass of pancreas   . Epidermal cyst of neck 04/06/2018  . Hypertrophic obstructive cardiomyopathy ([place]) 08/03/2016  . Essential hypertension 08/03/2016  . Shortness of breath 06/16/2016    Past Surgical History:  Procedure Laterality Date  . COLONOSCOPY  04/2000  . COLONOSCOPY W/ POLYPECTOMY  1988  . CYST  EXCISION N/A 04/07/2018   Procedure: EXCISION EPIDERMAL CYST POSTERIOR NECK;  Surgeon: Armandina Gemma, MD;  Location: [facility];  Service: General;  Laterality: N/A;  . ELBOW SURGERY    . EXTERNAL EAR SURGERY Right 1974   Stapedectomy  . EYE SURGERY Bilateral    cataract  . IR IMAGING GUIDED PORT INSERTION  12/19/2018  . SHOULDER SURGERY    . VAGINAL HYSTERECTOMY     without oopherectomy for birth control age 53     OB History   No obstetric history on file.      Home Medications    Prior to Admission medications   Medication Sig Start Date End Date Taking? Authorizing Provider  Cholecalciferol (VITAMIN D) 2000 units CAPS Take 1 capsule by mouth daily.   Yes [provider]  diltiazem (CARDIZEM CD) 180 MG 24 hr capsule Take 1 capsule (180 mg total) by mouth daily. 04/12/18  Yes Belva Crome, MD  lidocaine-prilocaine (EMLA) cream Apply 1 application topically as needed. 12/15/18  Yes Ladell Pier, MD  Misc Natural Products (OSTEO BI-FLEX ADV DOUBLE ST PO) Take 1 tablet by mouth daily.   Yes [provider]  Multiple Vitamins-Minerals (PRESERVISION AREDS) CAPS Take 1 capsule by mouth daily.   Yes [provider]  Polyethylene Glycol  3350 (MIRALAX PO) Take 17 g by mouth daily as needed (constipation).  12/15/18  Yes Ladell Pier, MD  prochlorperazine (COMPAZINE) 5 MG tablet Take 1 tablet (5 mg total) by mouth every 6 (six) hours as needed for nausea or vomiting. 12/15/18  Yes Ladell Pier, MD  fluconazole (DIFLUCAN) 50 MG tablet Take 1 tablet (50 mg total) by mouth daily for 5 days. 01/20/19 01/25/19  Owens Shark, NP  predniSONE (DELTASONE) 10 MG tablet Take 1 tablet (10 mg total) by mouth daily with breakfast. Patient not taking: Reported on 01/23/2019 01/05/19   Owens Shark, NP  traMADol (ULTRAM) 50 MG tablet Take 1 tablet (50 mg total) by mouth every 6 (six) hours as needed. Patient not taking: Reported on 01/23/2019 12/22/18   Ladell Pier, MD  traMADol-acetaminophen (ULTRACET) 37.5-325 MG tablet Take 1 tablet by mouth every 8 (eight) hours as needed. Patient not taking: Reported on 01/23/2019 01/05/19   Owens Shark, NP    Family History Family History  Problem Relation Age of Onset  . Melanoma Mother        dx 66s, d. 30  . Cerebral aneurysm Mother   . Hypertension Mother   . CAD Father   . Breast cancer Sister 22  . Colon cancer Brother 59  . Hypertension Brother   . Hypertension Son   . Hypertension Son     Social History Social History   Tobacco Use  . Smoking status: Never Smoker  . Smokeless tobacco: Never Used  Substance Use Topics  . Alcohol use: Yes    Comment: occ  . Drug use: No     Allergies   Other   Review of Systems Review of Systems  Respiratory: Positive for shortness of breath.   All other systems reviewed and are negative.    Physical Exam Updated Vital Signs BP 127/62 (BP Location: Left Arm)   Pulse 92   Temp 98.8 F (37.1 C) (Oral)   Resp 19   Ht 5\' 6"  (1.676 m)   Wt 65.8 kg   SpO2 95%   BMI 23.40 kg/m   Physical Exam Vitals signs and nursing note reviewed.  Constitutional:      General: She is not in acute distress.    Appearance: Normal appearance. She is well-developed.  HENT:     Head: Normocephalic and atraumatic.     Right Ear: Hearing normal.     Left Ear: Hearing normal.     Nose: Nose normal.  Eyes:     Conjunctiva/sclera: Conjunctivae normal.     Pupils: Pupils are equal, round, and reactive to light.  Neck:     Musculoskeletal: Normal range of motion and neck supple.  Cardiovascular:     Rate and Rhythm: Regular rhythm.     Heart sounds: S1 normal and S2 normal. No murmur. No friction rub. No gallop.   Pulmonary:     Effort: Pulmonary effort is normal. No respiratory distress.     Breath sounds: Normal breath sounds.  Chest:     Chest wall: No tenderness.  Abdominal:     General: Bowel sounds are normal.     Palpations: Abdomen is  soft.     Tenderness: There is no abdominal tenderness. There is no guarding or rebound. Negative signs include Murphy's sign and McBurney's sign.     Hernia: No hernia is present.  Musculoskeletal: Normal range of motion.  Skin:    General: Skin is warm and  dry.     Findings: No rash.  Neurological:     Mental Status: She is alert and oriented to person, place, and time.     GCS: GCS eye subscore is 4. GCS verbal subscore is 5. GCS motor subscore is 6.     Cranial Nerves: No cranial nerve deficit.     Sensory: No sensory deficit.     Coordination: Coordination normal.  Psychiatric:        Mood and Affect: Mood is anxious.        Speech: Speech normal.        Behavior: Behavior normal.        Thought Content: Thought content normal.      ED Treatments / Results  Labs (all labs ordered are listed, but only abnormal results are displayed) Labs Reviewed  BASIC METABOLIC PANEL - Abnormal; Notable for the following components:      Result Value   Calcium 8.7 (*)    All other components within normal limits  TROPONIN I - Abnormal; Notable for the following components:   Troponin I 0.07 (*)    All other components within normal limits  BRAIN NATRIURETIC PEPTIDE - Abnormal; Notable for the following components:   B Natriuretic Peptide 211.5 (*)    All other components within normal limits  CBC WITH DIFFERENTIAL/PLATELET - Abnormal; Notable for the following components:   RBC 3.57 (*)    Hemoglobin 10.6 (*)    HCT 32.3 (*)    RDW 17.2 (*)    Lymphs Abs 0.3 (*)    All other components within normal limits  PROTIME-INR  HEPARIN LEVEL (UNFRACTIONATED)  GLUCOSE, CAPILLARY  GLUCOSE, CAPILLARY  GLUCOSE, CAPILLARY  CBC WITH DIFFERENTIAL/PLATELET  CBC  COMPREHENSIVE METABOLIC PANEL  PROTIME-INR  APTT  HEPARIN LEVEL (UNFRACTIONATED)    EKG EKG Interpretation  Date/Time:  Monday Jan 23 2019 05:53:31 EDT Ventricular Rate:  88 PR Interval:    QRS Duration: 81 QT Interval:   391 QTC Calculation: 474 R Axis:   78 Text Interpretation:  Sinus rhythm Probable left atrial enlargement Nonspecific T abnrm, anterolateral leads Interpretation somewhat limited due to baseline artifact No significant change since last tracing Confirmed by Duffy Bruce [phone]) on 01/23/2019 7:09:51 AM   Radiology Dg Chest 2 View  Result Date: 01/23/2019 CLINICAL DATA:  Shortness of breath EXAM: CHEST - 2 VIEW COMPARISON:  Chest CT 12/06/2018 FINDINGS: Multiple pulmonary nodules consistent with metastatic disease, known. No convincing acute airspace opacity is superimposed. Normal heart size. Rightward tracheal deviation from large intrathoracic thyroid nodule. Right-sided port with tip at the SVC. IMPRESSION: Known pulmonary metastases. No evidence of acute superimposed disease. Electronically Signed   By: Monte Fantasia M.D.   On: 01/23/2019 06:10   Ct Angio Chest Pe W Or Wo Contrast  Result Date: 01/23/2019 CLINICAL DATA:  Shortness of breath.  Pancreas cancer EXAM: CT ANGIOGRAPHY CHEST WITH CONTRAST TECHNIQUE: Multidetector CT imaging of the chest was performed using the standard protocol during bolus administration of intravenous contrast. Multiplanar CT image reconstructions and MIPs were obtained to evaluate the vascular anatomy. CONTRAST:  169mL OMNIPAQUE IOHEXOL 350 MG/ML SOLN COMPARISON:  12/06/2018 FINDINGS: Cardiovascular: Branching central pulmonary emboli seen in the lobar and segmental vessels of the right middle and right lower lobes. Segmental to subsegmental emboli seen in the left lower lobe. RV to LV ratio is normal at 0.8. No cardiomegaly or pericardial effusion. There is extensive aortic and coronary atherosclerosis. Mediastinum/Nodes: Large left thyroid nodule extending into  the mediastinum and displacing the trachea with mild narrowing. Lungs/Pleura: Ill-defined metastases scattered throughout the lungs. A 3 cm mass in the right upper lobe has enlarged since 12/06/2018 when it  measured 22 mm. There is segmental ground-glass density in the right lower lobe which is likely lung infarct. Small right pleural effusion without pleural nodularity. Upper Abdomen: Known omental nodularity Musculoskeletal: No acute finding Critical Value/emergent results were called by telephone at the time of interpretation on 01/23/2019 at 8:17 am to Dr. Myrene Buddy , who verbally acknowledged these results. Review of the MIP images confirms the above findings. IMPRESSION: 1. Multifocal acute pulmonary embolism, lobar size on the right where there is a lower lobe infarct. 2. Pulmonary metastatic disease with progression since 12/06/2018. Electronically Signed   By: Monte Fantasia M.D.   On: 01/23/2019 08:20    Procedures Procedures (including critical care time)  Medications Ordered in ED Medications  albuterol (PROVENTIL) (2.5 MG/3ML) 0.083% nebulizer solution 5 mg (0 mg Nebulization Hold 01/23/19 0608)  sodium chloride (PF) 0.9 % injection (has no administration in time range)  heparin ADULT infusion 100 units/mL (25000 units/297mL sodium chloride 0.45%) (950 Units/hr Intravenous Not Given 01/23/19 2108)  traMADol (ULTRAM) tablet 50 mg (has no administration in time range)  diltiazem (CARDIZEM CD) 24 hr capsule 180 mg (180 mg Oral Given 01/23/19 1346)  0.9 %  sodium chloride infusion ( Intravenous Stopped 01/24/19 0056)  acetaminophen (TYLENOL) tablet 650 mg (has no administration in time range)    Or  acetaminophen (TYLENOL) suppository 650 mg (has no administration in time range)  oxyCODONE (Oxy IR/ROXICODONE) immediate release tablet 5 mg (has no administration in time range)  insulin aspart (novoLOG) injection 0-9 Units (0 Units Subcutaneous Not Given 01/23/19 1835)  insulin aspart (novoLOG) injection 0-5 Units (0 Units Subcutaneous Not Given 01/23/19 2152)  senna-docusate (Senokot-S) tablet 2 tablet (has no administration in time range)  polyethylene glycol (MIRALAX / GLYCOLAX) packet 17 g (has no  administration in time range)  sodium chloride flush (NS) 0.9 % injection 10-40 mL (has no administration in time range)  sodium chloride flush (NS) 0.9 % injection 10-40 mL (has no administration in time range)  sodium chloride 0.9 % bolus 500 mL (0 mLs Intravenous Stopped 01/23/19 0842)  iohexol (OMNIPAQUE) 350 MG/ML injection 100 mL (100 mLs Intravenous Contrast Given 01/23/19 0756)  heparin bolus via infusion 2,000 Units (2,000 Units Intravenous Bolus from Bag 01/23/19 0952)     Initial Impression / Assessment and Plan / ED Course  I have reviewed the triage vital signs and the nursing notes.  Pertinent labs & imaging results that were available during my care of the patient were reviewed by me and considered in my medical decision making (see chart for details).  Clinical Course as of Jan 23 249  Mon Jan 23, 2019  0736 Labs show positive trop, BNP. This could be demand, NSTEMI, PE. No signs of ST elevation or depression on EKG. Angio pending. Gentle fluids given for mild hypotension 2/2 poor PO intake. Last EF wnl on review of cardiac records.   [CI]  0801 On my prelim review of Angio, pt with significant PE, R>L. No obvious RH strain. Of note, pt also with pulmonary mets, mediastinal mass.   [CI]  [id number] Discussed with pt, daughter via telephone. Will admit. No CP.   [CI]    Clinical Course User Index [CI] Duffy Bruce, MD       Patient presents with complaints of shortness of breath.  She appears very anxious at arrival.  Her oxygenation is 99% on room air and her lungs are clear to auscultation.  She did have some mild tachypnea at arrival, however.  Reviewing her records from oncology reveals that she has been complaining of intermittent episodes of shortness of breath recently.  Oncology speculated that this might be anxiety related.  She does, however, have a solid organ cancer and therefore is at risk for PE.  Will perform cardiac evaluation and CT angios to rule out PE.  If  these are reassuring, likely can be discharged.  Will sign out to oncoming ER physician to follow-up.  Final Clinical Impressions(s) / ED Diagnoses   Final diagnoses:  Shortness of breath  Bilateral pulmonary embolism (HCC)  Elevated troponin  Malignant neoplasm metastatic to lung, unspecified laterality [facility])    ED Discharge Orders    None       Orpah Greek, MD 01/23/19 2263    Orpah Greek, MD 01/24/19 [phone]

## 2019-01-23 NOTE — ED Provider Notes (Signed)
Assumed care from Dr. Betsey Holiday at 7 AM. Briefly, the patient is a 85 y.o. female with PMHx of  has a past medical history of Aortic stenosis, mild, Bilateral cataracts, DOE (dyspnea on exertion), Facial basal cell cancer, Family history of breast cancer, Family history of colon cancer, Family history of malignant melanoma, Hearing loss in left ear, Heart murmur, History of colon polyps, Hyperlipidemia, Hypertension, Hypertrophic obstructive cardiomyopathy ([place]), Mass of pancreas, OA (osteoarthritis), Osteoporosis, Retinal vein occlusion, Varicose veins, and Vitamin D deficiency. here with SOB. Presented w/ tachypnea. CT angio pending. CXR shows known pulm mets but no signs of superimposed disease.  Reviewed records - pt on gemcitabine/Abraxane with Dr. Benay Spice.   .Critical Care Performed by: Duffy Bruce, MD Authorized by: Duffy Bruce, MD   Critical care provider statement:    Critical care time (minutes):  35   Critical care time was exclusive of:  Separately billable procedures and treating other patients and teaching time   Critical care was necessary to treat or prevent imminent or life-threatening deterioration of the following conditions:  Respiratory failure, circulatory failure and cardiac failure   Critical care was time spent personally by me on the following activities:  Development of treatment plan with patient or surrogate, discussions with consultants, evaluation of patient's response to treatment, examination of patient, obtaining history from patient or surrogate, ordering and performing treatments and interventions, ordering and review of laboratory studies, ordering and review of radiographic studies, pulse oximetry, re-evaluation of patient's condition and review of old charts   I assumed direction of critical care for this patient from another provider in my specialty: no       Clinical Course as of Jan 22 818  Mon Jan 23, 2019  0736 Labs show positive trop, BNP.  This could be demand, NSTEMI, PE. No signs of ST elevation or depression on EKG. Angio pending. Gentle fluids given for mild hypotension 2/2 poor PO intake. Last EF wnl on review of cardiac records.   [CI]  0801 On my prelim review of Angio, pt with significant PE, R>L. No obvious RH strain. Of note, pt also with pulmonary mets, mediastinal mass.   [CI]  [id number] Discussed with pt, daughter via telephone. Will admit. No CP.   [CI]    Clinical Course User Index [CI] Duffy Bruce, MD    Labs Reviewed  BASIC METABOLIC PANEL - Abnormal; Notable for the following components:      Result Value   Calcium 8.7 (*)    All other components within normal limits  TROPONIN I - Abnormal; Notable for the following components:   Troponin I 0.07 (*)    All other components within normal limits  BRAIN NATRIURETIC PEPTIDE - Abnormal; Notable for the following components:   B Natriuretic Peptide 211.5 (*)    All other components within normal limits  CBC WITH DIFFERENTIAL/PLATELET - Abnormal; Notable for the following components:   RBC 3.57 (*)    Hemoglobin 10.6 (*)    HCT 32.3 (*)    RDW 17.2 (*)    Lymphs Abs 0.3 (*)    All other components within normal limits  CBC WITH DIFFERENTIAL/PLATELET     EKG Interpretation  Date/Time:  Monday Jan 23 2019 05:53:31 EDT Ventricular Rate:  88 PR Interval:    QRS Duration: 81 QT Interval:  391 QTC Calculation: 474 R Axis:   78 Text Interpretation:  Sinus rhythm Probable left atrial enlargement Nonspecific T abnrm, anterolateral leads Interpretation somewhat limited due to baseline artifact  No significant change since last tracing Confirmed by Duffy Bruce [phone]) on 01/23/2019 7:09:51 AM          Duffy Bruce, MD 01/23/19 [phone]

## 2019-01-23 NOTE — Progress Notes (Signed)
Brief Pharmacy Note re: IV Heparin  For full details- see heparin note from earlier today by Colin Rhein, PharmD.  Patient on heparin 950 units/hr for +PE. Initial level therapeutic at 0.33 (goal 0.3-0.7) No bleeding or infusion related issues reported by RN  Plan: Continue heparin at current rate.  Re-check heparin level in 8h to verify level Noted plans to continue heparin for 48h prior to transitioning to DOAC.   Netta Cedars, PharmD, BCPS 01/23/2019@7 :17 PM

## 2019-01-24 DIAGNOSIS — I2699 Other pulmonary embolism without acute cor pulmonale: Secondary | ICD-10-CM | POA: Diagnosis not present

## 2019-01-24 DIAGNOSIS — R0602 Shortness of breath: Secondary | ICD-10-CM | POA: Diagnosis not present

## 2019-01-24 LAB — COMPREHENSIVE METABOLIC PANEL
ALT: 12 U/L (ref 0–44)
AST: 18 U/L (ref 15–41)
Albumin: 2.4 g/dL — ABNORMAL LOW (ref 3.5–5.0)
Alkaline Phosphatase: 51 U/L (ref 38–126)
Anion gap: 9 (ref 5–15)
BUN: 14 mg/dL (ref 8–23)
CO2: 21 mmol/L — ABNORMAL LOW (ref 22–32)
Calcium: 7.9 mg/dL — ABNORMAL LOW (ref 8.9–10.3)
Chloride: 106 mmol/L (ref 98–111)
Creatinine, Ser: 0.67 mg/dL (ref 0.44–1.00)
GFR calc Af Amer: >60 mL/min (ref >60)
GFR calc non Af Amer: >60 mL/min (ref >60)
Glucose, Bld: 89 mg/dL (ref 70–99)
Potassium: 3.5 mmol/L (ref 3.5–5.1)
Sodium: 136 mmol/L (ref 135–145)
Total Bilirubin: 1.1 mg/dL (ref 0.3–1.2)
Total Protein: 4.6 g/dL — ABNORMAL LOW (ref 6.5–8.1)

## 2019-01-24 LAB — CBC
HCT: 29.4 % — ABNORMAL LOW (ref 36.0–46.0)
Hemoglobin: 9.4 g/dL — ABNORMAL LOW (ref 12.0–15.0)
MCH: 29.4 pg (ref 26.0–34.0)
MCHC: 32 g/dL (ref 30.0–36.0)
MCV: 91.9 fL (ref 80.0–100.0)
Platelets: 197 10*3/uL (ref 150–400)
RBC: 3.2 MIL/uL — ABNORMAL LOW (ref 3.87–5.11)
RDW: 17.9 % — ABNORMAL HIGH (ref 11.5–15.5)
WBC: 7.2 10*3/uL (ref 4.0–10.5)
nRBC: 0 % (ref 0.0–0.2)

## 2019-01-24 LAB — APTT: aPTT: 113 seconds — ABNORMAL HIGH (ref 24–36)

## 2019-01-24 LAB — PROTIME-INR
INR: 1.1 (ref 0.8–1.2)
Prothrombin Time: 14.4 seconds (ref 11.4–15.2)

## 2019-01-24 LAB — GLUCOSE, CAPILLARY: Glucose-Capillary: 85 mg/dL (ref 70–99)

## 2019-01-24 LAB — HEPARIN LEVEL (UNFRACTIONATED): Heparin Unfractionated: 0.2 IU/mL — ABNORMAL LOW (ref 0.30–0.70)

## 2019-01-24 MED ORDER — ENOXAPARIN (LOVENOX) PATIENT EDUCATION KIT: PACK | Freq: Once | Status: DC

## 2019-01-24 MED ORDER — ELIQUIS 5 MG VTE STARTER PACK: ORAL_TABLET | ORAL | 0 refills | Status: DC

## 2019-01-24 MED ORDER — APIXABAN 5 MG PO TABS
10.0000 mg | ORAL_TABLET | Freq: Two times a day (BID) | ORAL | Status: DC
  Administered 2019-01-24: 10 mg via ORAL
  Filled 2019-01-24: qty 4

## 2019-01-24 MED ORDER — HEPARIN SOD (PORK) LOCK FLUSH 100 UNIT/ML IV SOLN
500.0000 [IU] | INTRAVENOUS | Status: AC | PRN
  Administered 2019-01-24: 500 [IU]

## 2019-01-24 NOTE — Progress Notes (Addendum)
HEMATOLOGY-ONCOLOGY PROGRESS NOTE  SUBJECTIVE: Tiffany [name] is seen for follow-up this morning.  Reports that she is feeling better.  Her breathing has improved.  She denies bleeding.  She has no other complaints this morning.    Malignant neoplasm of body of pancreas ([place])   12/15/2018 Initial Diagnosis    Malignant neoplasm of body of pancreas ([place])    12/15/2018 Cancer Staging    Staging form: Exocrine Pancreas, AJCC 8th Edition - Clinical: Stage IV (cT2, cN0, pM1) - Signed by Ladell Pier, MD on 12/15/2018    12/22/2018 -  Chemotherapy    The patient had gemcitabine (GEMZAR) 1,330 mg in sodium chloride 0.9 % 250 mL chemo infusion, 800 mg/m2 = 1,330 mg (100 % of original dose 800 mg/m2), Intravenous,  Once, 2 of 4 cycles Dose modification: 800 mg/m2 (original dose 800 mg/m2, Cycle 1, Reason: Provider Judgment) Administration: 1,330 mg (12/22/2018), 1,330 mg (01/05/2019), 1,330 mg (01/20/2019) PACLitaxel-protein bound (ABRAXANE) chemo infusion 175 mg, 100 mg/m2 = 175 mg (100 % of original dose 100 mg/m2), Intravenous, Once, 2 of 4 cycles Dose modification: 100 mg/m2 (original dose 100 mg/m2, Cycle 1, Reason: Provider Judgment) Administration: 175 mg (12/22/2018), 175 mg (01/05/2019)  for chemotherapy treatment.       REVIEW OF SYSTEMS:   Constitutional: Denies fevers, chills.  Reports her appetite is fair. Eyes: Denies blurriness of vision Ears, nose, mouth, throat, and face: Denies mucositis or sore throat Respiratory: Has an intermittent nonproductive cough.  He then improved. Cardiovascular: Denies palpitation, chest discomfort Gastrointestinal:  Denies nausea, heartburn or change in bowel habits Skin: Denies abnormal skin rashes Lymphatics: Denies new lymphadenopathy or easy bruising Neurological:Denies numbness, tingling or new weaknesses Behavioral/Psych: Mood is stable, no new changes  Extremities: No lower extremity edema All other systems were reviewed with the patient and are  negative.  I have reviewed the past medical history, past surgical history, social history and family history with the patient and they are unchanged from previous note.   PHYSICAL EXAMINATION: ECOG PERFORMANCE STATUS: 1 - Symptomatic but completely ambulatory  Vitals:   01/24/19 0631 01/24/19 0718  BP: (!) 114/59 (!) 113/56  Pulse: 86 79  Resp: 18 18  Temp: 98.2 F (36.8 C) 98.6 F (37 C)  SpO2: 92% 96%   Filed Weights   01/23/19 0545 01/24/19 0600  Weight: 145 lb (65.8 kg) 127 lb 3.3 oz (57.7 kg)    Intake/Output from previous day: 05/04 0701 - 05/05 0700 In: 1403.5 [I.V.:903.5; IV Piggyback:500] Out: 1 [Urine:1]  GENERAL:alert, no distress and comfortable SKIN: skin color, texture, turgor are normal, no rashes or significant lesions EYES: normal, Conjunctiva are pink and non-injected, sclera clear OROPHARYNX:no exudate, no erythema and lips, buccal mucosa, and tongue normal  NECK: supple, thyroid normal size, non-tender, without nodularity LYMPH:  no palpable lymphadenopathy in the cervical, axillary or inguinal LUNGS: Rales to the bilateral bases with normal breathing effort HEART: regular rate & rhythm and no murmurs and no lower extremity edema ABDOMEN:abdomen soft, non-tender and normal bowel sounds Musculoskeletal:no cyanosis of digits and no clubbing  NEURO: alert & oriented x 3 with fluent speech, no focal motor/sensory deficits  LABORATORY DATA:  I have reviewed the data as listed CMP Latest Ref Rng & Units 01/24/2019 01/23/2019 01/20/2019  Glucose 70 - 99 mg/dL 89 96 135(H)  BUN 8 - 23 mg/dL 14 17 15   Creatinine 0.44 - 1.00 mg/dL 0.67 0.71 0.76  Sodium 135 - 145 mmol/L 136 135 139  Potassium  3.5 - 5.1 mmol/L 3.5 4.0 3.8  Chloride 98 - 111 mmol/L 106 104 104  CO2 22 - 32 mmol/L 21(L) 22 28  Calcium 8.9 - 10.3 mg/dL 7.9(L) 8.7(L) 9.2  Total Protein 6.5 - 8.1 g/dL 4.6(L) - 5.3(L)  Total Bilirubin 0.3 - 1.2 mg/dL 1.1 - 0.5  Alkaline Phos 38 - 126 U/L 51 - 69   AST 15 - 41 U/L 18 - 23  ALT 0 - 44 U/L 12 - 23    Lab Results  Component Value Date   WBC 7.2 01/24/2019   HGB 9.4 (L) 01/24/2019   HCT 29.4 (L) 01/24/2019   MCV 91.9 01/24/2019   PLT 197 01/24/2019   NEUTROABS 7.2 01/23/2019    Dg Chest 2 View  Result Date: 01/23/2019 CLINICAL DATA:  Shortness of breath EXAM: CHEST - 2 VIEW COMPARISON:  Chest CT 12/06/2018 FINDINGS: Multiple pulmonary nodules consistent with metastatic disease, known. No convincing acute airspace opacity is superimposed. Normal heart size. Rightward tracheal deviation from large intrathoracic thyroid nodule. Right-sided port with tip at the SVC. IMPRESSION: Known pulmonary metastases. No evidence of acute superimposed disease. Electronically Signed   By: Monte Fantasia M.D.   On: 01/23/2019 06:10   Ct Angio Chest Pe W Or Wo Contrast  Result Date: 01/23/2019 CLINICAL DATA:  Shortness of breath.  Pancreas cancer EXAM: CT ANGIOGRAPHY CHEST WITH CONTRAST TECHNIQUE: Multidetector CT imaging of the chest was performed using the standard protocol during bolus administration of intravenous contrast. Multiplanar CT image reconstructions and MIPs were obtained to evaluate the vascular anatomy. CONTRAST:  160mL OMNIPAQUE IOHEXOL 350 MG/ML SOLN COMPARISON:  12/06/2018 FINDINGS: Cardiovascular: Branching central pulmonary emboli seen in the lobar and segmental vessels of the right middle and right lower lobes. Segmental to subsegmental emboli seen in the left lower lobe. RV to LV ratio is normal at 0.8. No cardiomegaly or pericardial effusion. There is extensive aortic and coronary atherosclerosis. Mediastinum/Nodes: Large left thyroid nodule extending into the mediastinum and displacing the trachea with mild narrowing. Lungs/Pleura: Ill-defined metastases scattered throughout the lungs. A 3 cm mass in the right upper lobe has enlarged since 12/06/2018 when it measured 22 mm. There is segmental ground-glass density in the right lower lobe  which is likely lung infarct. Small right pleural effusion without pleural nodularity. Upper Abdomen: Known omental nodularity Musculoskeletal: No acute finding Critical Value/emergent results were called by telephone at the time of interpretation on 01/23/2019 at 8:17 am to Dr. Myrene Buddy , who verbally acknowledged these results. Review of the MIP images confirms the above findings. IMPRESSION: 1. Multifocal acute pulmonary embolism, lobar size on the right where there is a lower lobe infarct. 2. Pulmonary metastatic disease with progression since 12/06/2018. Electronically Signed   By: Monte Fantasia M.D.   On: 01/23/2019 08:20    ASSESSMENT AND PLAN: 1. Pancreas mass/lung nodules/peritoneal nodules  Abdominal ultrasound 11/25/2018-1.0 cm pancreatic head cyst and a 5.5 mm lymph node adjacent to the pancreatic head.   MRI abdomen 11/25/2018-irregular poorly marginated hypoenhancing 3.2 x 2.2 cm posterior pancreatic body mass encasing the celiac artery; separate 1.4 cm pancreatic neck and 1.2 cm posterior pancreatic head cystic masses; multiple lung masses/pulmonary nodules scattered throughout both lung bases measuring up to 3.5 cm; extensive peritoneal carcinomatosis with soft tissue metastases throughout the omentum and perihepatic space; trace ascites.  CTs 12/06/2018-large left thyroid nodule, bilateral pulmonary nodules, pancreas body/tail mass with involvement of the celiac axis, peritoneal nodularity, omental caking at the anterior abdominal wall  CT biopsy of an omental nodule 12/12/2018-metastatic adenocarcinoma consistent with pancreas cancer  Cycle 1 gemcitabine/Abraxane 12/22/2018  Cycle 2 gemcitabine/Abraxane 01/05/2019  Common hereditary cancer panel-negative  Cycle 3 gemcitabine/Abraxane 01/20/2019 2. Abdominal pain secondary to #1-improved 3. Hypertension 4. Hypertrophic obstructive cardiomyopathy 5. Cough-likely secondary to pulmonary metastases 6. Hypercalcemia 12/22/2018 status post  Zometa 7. Anorexia secondary to #1 8. Multifocal acute pulmonary emboli diagnosed in May 2020  Tiffany [name] is a 85 year old female with metastatic pancreatic cancer currently undergoing treatment with gemcitabine and Abraxane.  Last chemotherapy was given on 01/20/2019.  She now presents with increasing shortness of breath and has been diagnosed with acute pulmonary emboli.  She has been started on anticoagulation.  1.  Options for anticoagulation were discussed with the patient including oral anticoagulation consisting of Xarelto or Eliquis versus Lovenox.  The patient prefers to be on oral anticoagulation.  Recommend Xarelto or Eliquis upon discharge. 2.  There is a slight increase in the right upper lobe lung mass.  The patient they have had mild disease progression prior to getting started on chemotherapy.  We will watch this on upcoming scans. 3.  She is noted to have mild anemia likely due to her recent chemotherapy.  Continue to monitor CBC and transfuse if her hemoglobin is less than 7.0 or active bleeding.    LOS: 0 days   Mikey Bussing, DNP, AGPCNP-BC, AOCNP 01/24/19   Tiffany [name] reports feeling well.  She wants to go home.  I discussed the case with the medical service.  She will be discharged on apixaban.  She is scheduled for a video visit later this week.

## 2019-01-24 NOTE — Discharge Summary (Signed)
Physician Discharge Summary  Tiffany [name] HUD:[id number] DOB: [birth year] DOA: 01/23/2019  PCP: Lawerance Cruel, MD  Admit date: 01/23/2019 Discharge date: 01/24/2019  Admitted From: Home Disposition: Home  Recommendations for Outpatient Follow-up:  1. Follow up with PCP in 1-2 weeks 2. Please obtain BMP/CBC in one week 3. Follow-up with oncologist as scheduled 4. Please follow up on the following pending results:  Home Health: None Equipment/Devices: None  Discharge Condition: Stable CODE STATUS: Full code Diet recommendation: Cardiac  Subjective: Patient seen and examined.  She has no complaints.  Denies any chest pain or shortness of breath.  She was not even hypoxic and saturating more than 94% on room air.  She wants to go home.  Brief/Interim Summary: Tiffany [name] is a 85 y.o. female with medical history significant of pancreatic cancer on chemotherapy, hypertension, Came to the hospital on 01/23/2019 for evaluation of shortness of breath.  According to her, she was doing fine and was in her usual state of health the night before but on the morning of admission, early morning she woke up with shortness of breath.  EMS was called.  Initially she called EMS who came evaluated her but even after them leaving she felt short of breath therefore came to the hospital for further evaluation.  Denies any fevers, chills, palpitations, lightheadedness, dizziness and other complaints.  Denies any previous history of blood clots  In the ER CT of the chest showed multifocal acute pulmonary embolism, lobar size on the right.  Pulmonary metastatic disease.  Although she was hemodynamically stable and not been hypoxic, given her age and comorbidities, request were made to admit for observation.  She was started on heparin drip.  When I saw her this morning, she had no complaints at all.  She was not hypoxic.  Never required any oxygen.  Was wanting to go home.  I initially had planned to put her on  Coumadin with bridging Lovenox due to history of cancer and had a long discussion with her and we were making arrangements for her daughter to come in and educate her on Lovenox injections but then she was seen by oncology group who had recommended Eliquis or Pradaxa so now she is going to be discharged on Eliquis.  She is hemodynamically stable.  She will resume all her home medications and follow-up with her PCP within 1 to 2 weeks.  Her hemoglobin dropped only a change from 10.6-9.4.  No signs of bleeding.  I recommend repeating CBC when she sees her PCP.  Discharge Diagnoses:  Principal Problem:   Bilateral pulmonary embolism (HCC) Active Problems:   Hypertrophic obstructive cardiomyopathy (H[facility])   Essential hypertension   Malignant neoplasm of body of pancreas [facility])    Discharge Instructions  Discharge Instructions    Discharge patient   Complete by:  As directed    Discharge disposition:  01-Home or Self Care   Discharge patient date:  01/24/2019     Allergies as of 01/24/2019      Reactions   Other    Silk fabric-caused Rash      Medication List    TAKE these medications   diltiazem 180 MG 24 hr capsule Commonly known as:  CARDIZEM CD Take 1 capsule (180 mg total) by mouth daily.   Eliquis DVT/PE Starter Pack 5 MG Tabs Take as directed on package: start with two-5mg  tablets twice daily for 7 days. On day 8, switch to one-5mg  tablet twice daily.   fluconazole 50  MG tablet Commonly known as:  Diflucan Take 1 tablet (50 mg total) by mouth daily for 5 days.   lidocaine-prilocaine cream Commonly known as:  EMLA Apply 1 application topically as needed.   MIRALAX PO Take 17 g by mouth daily as needed (constipation).   OSTEO BI-FLEX ADV DOUBLE ST PO Take 1 tablet by mouth daily.   predniSONE 10 MG tablet Commonly known as:  DELTASONE Take 1 tablet (10 mg total) by mouth daily with breakfast.   PreserVision AREDS Caps Take 1 capsule by mouth daily.    prochlorperazine 5 MG tablet Commonly known as:  COMPAZINE Take 1 tablet (5 mg total) by mouth every 6 (six) hours as needed for nausea or vomiting.   traMADol 50 MG tablet Commonly known as:  ULTRAM Take 1 tablet (50 mg total) by mouth every 6 (six) hours as needed.   traMADol-acetaminophen 37.5-325 MG tablet Commonly known as:  ULTRACET Take 1 tablet by mouth every 8 (eight) hours as needed.   Vitamin D 50 MCG (2000 UT) Caps Take 1 capsule by mouth daily.      Follow-up Information    Lawerance Cruel, MD Follow up in 1 week(s).   Specialty:  Family Medicine Contact information: [place] [address] [phone]          Allergies  Allergen Reactions  . Other     Silk fabric-caused Rash    Consultations: Hematology/oncology   Procedures/Studies: Dg Chest 2 View  Result Date: 01/23/2019 CLINICAL DATA:  Shortness of breath EXAM: CHEST - 2 VIEW COMPARISON:  Chest CT 12/06/2018 FINDINGS: Multiple pulmonary nodules consistent with metastatic disease, known. No convincing acute airspace opacity is superimposed. Normal heart size. Rightward tracheal deviation from large intrathoracic thyroid nodule. Right-sided port with tip at the SVC. IMPRESSION: Known pulmonary metastases. No evidence of acute superimposed disease. Electronically Signed   By: Monte Fantasia M.D.   On: 01/23/2019 06:10   Ct Angio Chest Pe W Or Wo Contrast  Result Date: 01/23/2019 CLINICAL DATA:  Shortness of breath.  Pancreas cancer EXAM: CT ANGIOGRAPHY CHEST WITH CONTRAST TECHNIQUE: Multidetector CT imaging of the chest was performed using the standard protocol during bolus administration of intravenous contrast. Multiplanar CT image reconstructions and MIPs were obtained to evaluate the vascular anatomy. CONTRAST:  158mL OMNIPAQUE IOHEXOL 350 MG/ML SOLN COMPARISON:  12/06/2018 FINDINGS: Cardiovascular: Branching central pulmonary emboli seen in the lobar and segmental vessels of the  right middle and right lower lobes. Segmental to subsegmental emboli seen in the left lower lobe. RV to LV ratio is normal at 0.8. No cardiomegaly or pericardial effusion. There is extensive aortic and coronary atherosclerosis. Mediastinum/Nodes: Large left thyroid nodule extending into the mediastinum and displacing the trachea with mild narrowing. Lungs/Pleura: Ill-defined metastases scattered throughout the lungs. A 3 cm mass in the right upper lobe has enlarged since 12/06/2018 when it measured 22 mm. There is segmental ground-glass density in the right lower lobe which is likely lung infarct. Small right pleural effusion without pleural nodularity. Upper Abdomen: Known omental nodularity Musculoskeletal: No acute finding Critical Value/emergent results were called by telephone at the time of interpretation on 01/23/2019 at 8:17 am to Dr. Myrene Buddy , who verbally acknowledged these results. Review of the MIP images confirms the above findings. IMPRESSION: 1. Multifocal acute pulmonary embolism, lobar size on the right where there is a lower lobe infarct. 2. Pulmonary metastatic disease with progression since 12/06/2018. Electronically Signed   By: Neva Seat.D.  On: 01/23/2019 08:20      Discharge Exam: Vitals:   01/24/19 0631 01/24/19 0718  BP: (!) 114/59 (!) 113/56  Pulse: 86 79  Resp: 18 18  Temp: 98.2 F (36.8 C) 98.6 F (37 C)  SpO2: 92% 96%   Vitals:   01/23/19 2026 01/24/19 0600 01/24/19 0631 01/24/19 0718  BP: 127/62  (!) 114/59 (!) 113/56  Pulse: 92  86 79  Resp: 19  18 18   Temp: 98.8 F (37.1 C)  98.2 F (36.8 C) 98.6 F (37 C)  TempSrc: Oral  Oral Oral  SpO2: 95%  92% 96%  Weight:  57.7 kg    Height:        General: Pt is alert, awake, not in acute distress Cardiovascular: RRR, S1/S2 +, no rubs, no gallops Respiratory: CTA bilaterally, no wheezing, no rhonchi Abdominal: Soft, NT, ND, bowel sounds + Extremities: no edema, no cyanosis    The results of  significant diagnostics from this hospitalization (including imaging, microbiology, ancillary and laboratory) are listed below for reference.     Microbiology: No results found for this or any previous visit (from the past 240 hour(s)).   Labs: BNP (last 3 results) Recent Labs    01/23/19 0612  BNP 876.8*   Basic Metabolic Panel: Recent Labs  Lab 01/20/19 0845 01/23/19 0612 01/24/19 0353  NA 139 135 136  K 3.8 4.0 3.5  CL 104 104 106  CO2 28 22 21*  GLUCOSE 135* 96 89  BUN 15 17 14   CREATININE 0.76 0.71 0.67  CALCIUM 9.2 8.7* 7.9*   Liver Function Tests: Recent Labs  Lab 01/20/19 0845 01/24/19 0353  AST 23 18  ALT 23 12  ALKPHOS 69 51  BILITOT 0.5 1.1  PROT 5.3* 4.6*  ALBUMIN 2.8* 2.4*   No results for input(s): LIPASE, AMYLASE in the last 168 hours. No results for input(s): AMMONIA in the last 168 hours. CBC: Recent Labs  Lab 01/20/19 0845 01/23/19 0735 01/24/19 0353  WBC 8.3 7.8 7.2  NEUTROABS 6.3 7.2  --   HGB 11.9* 10.6* 9.4*  HCT 36.1 32.3* 29.4*  MCV 88.0 90.5 91.9  PLT 283 202 197   Cardiac Enzymes: Recent Labs  Lab 01/23/19 0612  TROPONINI 0.07*   BNP: Invalid input(s): POCBNP CBG: Recent Labs  Lab 01/23/19 1240 01/23/19 1648 01/23/19 2142 01/24/19 0737  GLUCAP 79 91 97 85   D-Dimer No results for input(s): DDIMER in the last 72 hours. Hgb A1c No results for input(s): HGBA1C in the last 72 hours. Lipid Profile No results for input(s): CHOL, HDL, LDLCALC, TRIG, CHOLHDL, LDLDIRECT in the last 72 hours. Thyroid function studies No results for input(s): TSH, T4TOTAL, T3FREE, THYROIDAB in the last 72 hours.  Invalid input(s): FREET3 Anemia work up No results for input(s): VITAMINB12, FOLATE, FERRITIN, TIBC, IRON, RETICCTPCT in the last 72 hours. Urinalysis    Component Value Date/Time   COLORURINE YELLOW 11/25/2018 1644   APPEARANCEUR CLEAR 11/25/2018 1644   LABSPEC 1.015 11/25/2018 1644   PHURINE 6.0 11/25/2018 [place] 11/25/2018 1644   HGBUR NEGATIVE 11/25/2018 [place] 11/25/2018 1644   KETONESUR 5 (A) 11/25/2018 1644   PROTEINUR NEGATIVE 11/25/2018 1644   NITRITE NEGATIVE 11/25/2018 1644   LEUKOCYTESUR MODERATE (A) 11/25/2018 1644   Sepsis Labs Invalid input(s): PROCALCITONIN,  WBC,  LACTICIDVEN Microbiology No results found for this or any previous visit (from the past 240 hour(s)).   Time coordinating discharge: 30  minutes.  SIGNED:   Darliss Cheney, MD  [facility] 01/24/2019, 11:09 AM Pager [phone]  If 7PM-7AM, please contact night-coverage [email or web address] Password [email or web address]

## 2019-01-24 NOTE — Discharge Instructions (Signed)
Information on my medicine - ELIQUIS (apixaban)  This medication education was reviewed with me or my healthcare representative as part of my discharge preparation.   Why was Eliquis prescribed for you? Eliquis was prescribed to treat blood clots that may have been found in the veins of your legs (deep vein thrombosis) or in your lungs (pulmonary embolism) and to reduce the risk of them occurring again.  What do You need to know about Eliquis ? The starting dose is 10 mg (two 5 mg tablets) taken TWICE daily for the FIRST SEVEN (7) DAYS, then on Tuesday May 12th  the dose is reduced to ONE 5 mg tablet taken TWICE daily.  Eliquis may be taken with or without food.   Try to take the dose about the same time in the morning and in the evening. If you have difficulty swallowing the tablet whole please discuss with your pharmacist how to take the medication safely.  Take Eliquis exactly as prescribed and DO NOT stop taking Eliquis without talking to the doctor who prescribed the medication.  Stopping may increase your risk of developing a new blood clot.  Refill your prescription before you run out.  After discharge, you should have regular check-up appointments with your healthcare provider that is prescribing your Eliquis.    What do you do if you miss a dose? If a dose of ELIQUIS is not taken at the scheduled time, take it as soon as possible on the same day and twice-daily administration should be resumed. The dose should not be doubled to make up for a missed dose.  Important Safety Information A possible side effect of Eliquis is bleeding. You should call your healthcare provider right away if you experience any of the following: ? Bleeding from an injury or your nose that does not stop. ? Unusual colored urine (red or dark brown) or unusual colored stools (red or black). ? Unusual bruising for unknown reasons. ? A serious fall or if you hit your head (even if there is no  bleeding).  Some medicines may interact with Eliquis and might increase your risk of bleeding or clotting while on Eliquis. To help avoid this, consult your healthcare provider or pharmacist prior to using any new prescription or non-prescription medications, including herbals, vitamins, non-steroidal anti-inflammatory drugs (NSAIDs) and supplements.  This website has more information on Eliquis (apixaban): http://www.eliquis.com/eliquis/home   Bleeding Precautions When on Anticoagulant Therapy, Adult Anticoagulant therapy, also called blood thinner therapy, is medicine that helps to prevent and treat blood clots. The medicine works by stopping blood clots from forming or growing. Blood clots that form in your blood vessels can be dangerous. They can break loose and travel to the heart, lungs, or brain. This increases the risk of a heart attack, stroke, or blocked lung artery (pulmonary embolism). Anticoagulants also increase the risk of bleeding. Try to protect yourself from cuts and other injuries that can cause bleeding. It is important to take anticoagulants exactly as told by your health care provider. Why do I need to be on anticoagulant therapy? You may need this medicine if you are at risk of developing a blood clot. Conditions that increase your risk of a blood clot include:  Being born with heart disease or a heart malformation (congenital heart disease).  Developing heart disease.  Having had surgery, such as valve replacement.  Having had a serious accident or other type of severe injury (trauma).  Having certain types of cancer.  Having certain diseases that  can increase blood clotting.  Having a high risk of stroke or heart attack.  Having atrial fibrillation (AF). What are the common anticoagulant medicines? There are several types of anticoagulant medicines. The most common types are:  Medicines that you take by mouth (oral medicines), such as: ? Warfarin. ? Novel  oral anticoagulants (NOACs), such as: ? Direct thrombin inhibitors (dabigatran). ? Factor Xa inhibitors (apixaban, edoxaban, and rivaroxaban).  Injections, such as: ? Unfractionated heparin. ? Low molecular weight heparin. These anticoagulants work in different ways to prevent blood clots. They also have different risks and side effects. What do I need to remember while on anticoagulant therapy? Taking anticoagulants  Take your medicine at the same time every day. If you forget to take your medicine, take it as soon as you remember. Do not double your dosage of medicine if you miss a whole day. Take your normal dose and call your health care provider.  Do not stop taking your medicine unless your health care provider approves. Stopping the medicine can increase your risk of developing a blood clot. Taking other medicines  Take over-the-counter and prescriptions medicines only as told by your health care provider.  Do not take over-the-counter NSAIDs, including aspirin and ibuprofen, while you are on anticoagulant therapy. These medicines increase your risk of dangerous bleeding.  Get approval from your health care provider before you start taking any new medicines, vitamins, or herbal products. Some of these could interfere with your therapy. General instructions  Keep all follow-up visits as told by your health care provider. This is important.  If you are pregnant or trying to get pregnant, talk with a health care provider about anticoagulants. Some of these medicines are not safe to take during pregnancy.  Tell all health care providers, including your dentist, that you are on anticoagulant therapy. It is especially important to tell providers before you have any surgery, medical procedures, or dental work done. What precautions should I take?   Be very careful when using knives, scissors, or other sharp objects.  Use an electric razor instead of a blade.  Do not use  toothpicks.  Use a soft-bristled toothbrush. Brush your teeth gently.  Always wear shoes outdoors and wear slippers indoors.  Be careful when cutting your fingernails and toenails.  Place bath mats in the bathroom. If possible, install handrails as well.  Wear gloves while you do yard work.  Wear your seat belt.  Prevent falls by removing loose rugs and extension cords from areas where you walk. Use a cane or walker if you need it.  Avoid constipation by: ? Drinking enough fluid to keep your urine clear or pale yellow. ? Eating foods that are high in fiber, such as fresh fruits and vegetables, whole grains, and beans. ? Limiting foods that are high in fat and processed sugars, such as fried and sweet foods.  Do not play contact sports or participate in other activities that have a high risk for injury. What other precautions are important if on warfarin therapy? If you are taking a type of anticoagulant called warfarin, make sure you:  Work with a diet and nutrition specialist (dietitian) to make an eating plan. Do not make any sudden changes to your diet after you have started your eating plan.  Do not drink alcohol. It can interfere with your medicine and increase your risk of an injury that causes bleeding.  Get regular blood tests as told by your health care provider. What are some questions  to ask my health care provider?  Why do I need anticoagulant therapy?  What is the best anticoagulant therapy for my condition?  How long will I need anticoagulant therapy?  What are the side effects of anticoagulant therapy?  When should I take my medicine? What should I do if I forget to take it?  Will I need to have regular blood tests?  Do I need to change my diet? Are there foods or drinks that I should avoid?  What activities are safe for me?  What should I do if I want to get pregnant? Contact a health care provider if:  You miss a dose of medicine: ? And you are not  sure what to do. ? For more than one day.  You have: ? Menstrual bleeding that is heavier than normal. ? Bloody or brown urine. ? Easy bruising. ? Black and tarry stool or bright red stool. ? Side effects from your medicine.  You feel weak or dizzy.  You become pregnant. Get help right away if:  You have bleeding that will not stop within 20 minutes from: ? The nose. ? The gums. ? A cut on the skin.  You have a severe headache or stomachache.  You vomit or cough up blood.  You fall or hit your head. Summary  Anticoagulant therapy, also called blood thinner therapy, is medicine that helps to prevent and treat blood clots.  Anticoagulants work in different ways to prevent blood clots. They also have different risks and side effects.  Talk with your health care provider about any precautions that you should take while on anticoagulant therapy. This information is not intended to replace advice given to you by your health care provider. Make sure you discuss any questions you have with your health care provider. Document Released: 08/19/2015 Document Revised: 11/24/2016 Document Reviewed: 11/24/2016 Elsevier Interactive Patient Education  2019 [employer] Pulmonary Embolism  A pulmonary embolism (PE) is a sudden blockage or decrease of blood flow in one lung or both lungs. Most blockages come from a blood clot that forms in a lower leg, thigh, or arm vein (deep vein thrombosis, DVT) and travels to the lungs. A clot is blood that has thickened into a gel or solid. PE is a dangerous and life-threatening condition that needs to be treated right away. What are the causes? This condition is usually caused by a blood clot that forms in a vein and moves to the lungs. In rare cases, it may be caused by air, fat, part of a tumor, or other tissue that moves through the veins and into the lungs. What increases the risk? The following factors may make you more likely to develop this  condition:  Traumatic injury, such as breaking a hip or leg.  Spinal cord injury.  Orthopedic surgery, especially hip or knee replacement.  Any major surgery.  Stroke.  Having DVT.  Blood clots or blood clotting disease.  Long-term (chronic) lung or heart disease.  Taking medicines that contain estrogen. These include birth control pills and hormone replacement therapy.  Cancer and chemotherapy.  Having a central venous catheter.  Pregnancy and the period of time after delivery (postpartum).  Being older than age 70.  Being overweight.  Smoking. What are the signs or symptoms? Symptoms of this condition usually start suddenly and include:  Shortness of breath during activity or at rest.  Coughing or coughing up blood or blood-tinged mucus.  Chest pain that is often worse with deep breaths.  Rapid or irregular heartbeat.  Feeling light-headed or dizzy.  Fainting.  Feeling anxious.  Fever.  Sweating.  Pain and swelling in a leg. This is a symptom of DVT, which can lead to PE. How is this diagnosed? This condition may be diagnosed based on:  Your medical history.  A physical exam.  Blood tests.  CT pulmonary angiogram. This test checks blood flow in and around your lungs.  Ventilation-perfusion scan, also called a lung VQ scan. This test measures air flow and blood flow to the lungs.  Ultrasound of the legs. How is this treated? Treatment for this condition depends on many factors, such as the cause of your PE, your risk for bleeding or developing more clots, and other medical conditions you have. Treatment aims to remove, dissolve, or stop blood clots from forming or growing larger. Treatment may include:  Medicines, such as: ? Blood thinning medicines (anticoagulants) to stop clots from forming or growing. ? Medicines that dissolve clots (thrombolytics).  Procedures, such as: ? Using a flexible tube to remove a blood clot (embolectomy) or  deliver medicine to destroy it (catheter-directed thrombolysis). ? Inserting a filter into a large vein that carries blood to the heart (inferior vena cava). This filter (vena cava filter) catches blood clots before they reach the lungs. ? Surgery to remove the clot (surgical embolectomy). This is rare. You may need a combination of immediate, long-term (up to 3 months after diagnosis), and extended (more than 3 months after diagnosis) treatments. Your treatment may continue for several months (maintenance therapy). You and your health care provider will work together to choose the treatment program that is best for you. Follow these instructions at home: Medicines  Take over-the-counter and prescription medicines only as told by your health care provider.  If you are taking an anticoagulant medicine: ? Take the medicine every day at the same time each day. ? Understand what foods and drugs interact with your medicine. ? Understand the side effects of this medicine, including excessive bruising or bleeding. Ask your health care provider or pharmacist about other side effects. General instructions  Wear a medical alert bracelet or carry a medical alert card that says you have had a PE and lists what medicines you take.  Ask your health care provider when you may return to your normal activities. Avoid sitting or lying for a long time without moving.  Maintain a healthy weight. Ask your health care provider what weight is healthy for you.  Do not use any products that contain nicotine or tobacco, such as cigarettes and e-cigarettes. If you need help quitting, ask your health care provider.  Talk with your health care provider about any travel plans. It is important to make sure that you are still able to take your medicine while on trips.  Keep all follow-up visits as told by your health care provider. This is important. Contact a health care provider if:  You missed a dose of your blood  thinner medicine. Get help right away if:  You have: ? New or increased pain, swelling, warmth, or redness in an arm or leg. ? Numbness or tingling in an arm or leg. ? Shortness of breath during activity or at rest. ? A fever. ? Chest pain. ? A rapid or irregular heartbeat. ? A severe headache. ? Vision changes. ? A serious fall or accident, or you hit your head. ? Stomach (abdominal) pain. ? Blood in your vomit, stool, or urine. ? A cut that will  not stop bleeding.  You cough up blood.  You feel light-headed or dizzy.  You cannot move your arms or legs.  You are confused or have memory loss. These symptoms may represent a serious problem that is an emergency. Do not wait to see if the symptoms will go away. Get medical help right away. Call your local emergency services (911 in the [place] Do not drive yourself to the hospital. Summary  A pulmonary embolism (PE) is a sudden blockage or decrease of blood flow in one lung or both lungs. PE is a dangerous and life-threatening condition that needs to be treated right away.  Treatments for this condition usually include medicines to thin your blood (anticoagulants) or medicines to break apart blood clots (thrombolytics).  If you are given blood thinners, it is important to take the medicine every single day at the same time each day.  If you have signs of PE or DVT, call your local emergency services (911 in the [place] This information is not intended to replace advice given to you by your health care provider. Make sure you discuss any questions you have with your health care provider. Document Released: 09/04/2000 Document Revised: 04/22/2018 Document Reviewed: 10/21/2017 Elsevier Interactive Patient Education  2019 [place] on my medicine - ELIQUIS (apixaban)  This medication education was reviewed with me or my healthcare representative as part of my discharge preparation.  The pharmacist that spoke with me  during my hospital stay was:   Why was Eliquis prescribed for you? Eliquis was prescribed to treat blood clots that may have been found in the veins of your legs (deep vein thrombosis) or in your lungs (pulmonary embolism) and to reduce the risk of them occurring again.  What do You need to know about Eliquis ? The starting dose is 10 mg (two 5 mg tablets) taken TWICE daily for the FIRST SEVEN (7) DAYS, then on 01/31/19 (May 12th)  the dose is reduced to ONE 5 mg tablet taken TWICE daily.  Eliquis may be taken with or without food.   Try to take the dose about the same time in the morning and in the evening. If you have difficulty swallowing the tablet whole please discuss with your pharmacist how to take the medication safely.  Take Eliquis exactly as prescribed and DO NOT stop taking Eliquis without talking to the doctor who prescribed the medication.  Stopping may increase your risk of developing a new blood clot.  Refill your prescription before you run out.  After discharge, you should have regular check-up appointments with your healthcare provider that is prescribing your Eliquis.    What do you do if you miss a dose? If a dose of ELIQUIS is not taken at the scheduled time, take it as soon as possible on the same day and twice-daily administration should be resumed. The dose should not be doubled to make up for a missed dose.  Important Safety Information A possible side effect of Eliquis is bleeding. You should call your healthcare provider right away if you experience any of the following: ? Bleeding from an injury or your nose that does not stop. ? Unusual colored urine (red or dark brown) or unusual colored stools (red or black). ? Unusual bruising for unknown reasons. ? A serious fall or if you hit your head (even if there is no bleeding).  Some medicines may interact with Eliquis and might increase your risk of bleeding or clotting while on Eliquis. To  help avoid this,  consult your healthcare provider or pharmacist prior to using any new prescription or non-prescription medications, including herbals, vitamins, non-steroidal anti-inflammatory drugs (NSAIDs) and supplements.  This website has more information on Eliquis (apixaban): http://www.eliquis.com/eliquis/home

## 2019-01-24 NOTE — Progress Notes (Signed)
ANTICOAGULATION CONSULT NOTE - Follow Up Consult  Pharmacy Consult for Heparin Indication: pulmonary embolus  Allergies  Allergen Reactions  . Other     Silk fabric-caused Rash    Patient Measurements: Height: 5\' 6"  (167.6 cm) Weight: 145 lb (65.8 kg) IBW/kg (Calculated) : 59.3 Heparin Dosing Weight:   Vital Signs: Temp: 98.8 F (37.1 C) (05/04 2026) Temp Source: Oral (05/04 2026) BP: 127/62 (05/04 2026) Pulse Rate: 92 (05/04 2026)  Labs: Recent Labs    01/23/19 0612 01/23/19 0735 01/23/19 0844 01/23/19 1806 01/24/19 0353  HGB  --  10.6*  --   --  9.4*  HCT  --  32.3*  --   --  29.4*  PLT  --  202  --   --  197  APTT  --   --   --   --  113*  LABPROT  --   --  14.6  --  14.4  INR  --   --  1.2  --  1.1  HEPARINUNFRC  --   --   --  0.33 0.20*  CREATININE 0.71  --   --   --  0.67  TROPONINI 0.07*  --   --   --   --     Estimated Creatinine Clearance: 43.8 mL/min (by C-G formula based on SCr of 0.67 mg/dL).   Medications:  Infusions:  . heparin 1,000 Units/hr (01/24/19 0443)    Assessment: Patient with hepain level low.  No heparin issues per RN.  Goal of Therapy:  Heparin level 0.3-0.7 units/ml Monitor platelets by anticoagulation protocol: Yes   Plan:  Increase heparin to 1000 units/hr Recheck level at [address], [place] 01/24/2019,6:08 AM

## 2019-01-24 NOTE — Progress Notes (Signed)
CMT reported pt had 49 beat run of V-tach. HR reaching 200. NT reports pt was getting bath at this time. Once this RN arrived to pt's room, pt resting in bed and asymptomatic. No reports of CP and HR back to 80s. VSS. Will pass information along to oncoming nurse.

## 2019-01-24 NOTE — Progress Notes (Signed)
PT Cancellation Note / Screen  Patient Details Name: Tiffany [name] MRN: [id number] DOB: [birth year]   Cancelled Treatment:    Reason Eval/Treat Not Completed: PT screened, no needs identified, will sign off RN reports no PT needs as pt to d/c home.   Xiomara Sevillano,KATHrine E 01/24/2019, 12:12 PM Carmelia Bake, PT, DPT Acute [facility] Services Office: [phone] Pager: [phone]

## 2019-01-26 ENCOUNTER — Telehealth: Payer: Self-pay | Admitting: Oncology

## 2019-01-26 NOTE — Telephone Encounter (Signed)
Spoke with patient and confirmed Webex mtg. For 01/27/19. Pt requested that step-by-step email how to download and access Webex applications be sent to daughter at [email or web address]

## 2019-01-27 ENCOUNTER — Inpatient Hospital Stay (HOSPITAL_BASED_OUTPATIENT_CLINIC_OR_DEPARTMENT_OTHER): Payer: Medicare Other | Admitting: Oncology

## 2019-01-27 ENCOUNTER — Telehealth: Payer: Self-pay | Admitting: Oncology

## 2019-01-27 DIAGNOSIS — C251 Malignant neoplasm of body of pancreas: Secondary | ICD-10-CM

## 2019-01-27 NOTE — Telephone Encounter (Signed)
No los per 5/8.

## 2019-01-27 NOTE — Progress Notes (Signed)
[facility] OFFICE VISIT PROGRESS NOTE  I connected with@ on 01/27/19 at 1:35 PM EDT by video visit and verified that I am speaking with the correct person using two identifiers.    Other persons participating in the visit and their role in the encounter: Daughter  Patient's location: Home Provider's location: Office    Diagnosis: Pancreas cancer  Interval history: Tiffany [name] was admitted with dyspnea on 01/23/2019.  She was diagnosed with bilateral pulmonary embolism.  She was treated with heparin anticoagulation and discharged home 01/24/2019 on apixaban.  She has experienced partial improvement in exertional dyspnea since beginning anticoagulation therapy.  She had a small amount of bleeding from her nose following discharge from the hospital, this has resolved.  No other bleeding. She continues to have anorexia.  She discontinue prednisone.  No pain.   Lab Results:  Lab Results  Component Value Date   WBC 7.2 01/24/2019   HGB 9.4 (L) 01/24/2019   HCT 29.4 (L) 01/24/2019   MCV 91.9 01/24/2019   PLT 197 01/24/2019   NEUTROABS 7.2 01/23/2019    Imaging:  No results found.  Medications: I have reviewed the patient's current medications.  Assessment/Plan: 1. Pancreas mass/lung nodules/peritoneal nodules  Abdominal ultrasound 11/25/2018-1.0 cm pancreatic head cyst and a 5.5 mm lymph node adjacent to the pancreatic head.   MRI abdomen 11/25/2018-irregular poorly marginated hypoenhancing 3.2 x 2.2 cm posterior pancreatic body mass encasing the celiac artery; separate 1.4 cm pancreatic neck and 1.2 cm posterior pancreatic head cystic masses; multiple lung masses/pulmonary nodules scattered throughout both lung bases measuring up to 3.5 cm; extensive peritoneal carcinomatosis with soft tissue metastases throughout the omentum and perihepatic space; trace ascites.  CTs 12/06/2018-large left thyroid nodule, bilateral pulmonary nodules,  pancreas body/tail mass with involvement of the celiac axis, peritoneal nodularity, omental caking at the anterior abdominal wall  CT biopsy of an omental nodule 12/12/2018-metastatic adenocarcinoma consistent with pancreas cancer  Cycle 1 gemcitabine/Abraxane 12/22/2018  Cycle 2 gemcitabine/Abraxane 01/05/2019  Common hereditary cancer panel-negative  Cycle3gemcitabine/Abraxane 01/20/2019 2. Abdominal pain secondary to #1-improved 3. Hypertension 4. Hypertrophic obstructive cardiomyopathy 5. Cough-likely secondary to pulmonary metastases 6. Hypercalcemia 12/22/2018 status post Zometa 7. Anorexia secondary to #1 8. Multifocal acute pulmonary emboli diagnosed on CT 01/23/2019- treated with heparin and discharged on Eliquis    Disposition: Tiffany [name] has metastatic pancreas cancer.  She was admitted earlier this week with acute bilateral pulmonary embolism.  She is now maintained on apixaban. Her performance status remains poor.  I reviewed the admission chest CT.  Some of the lung nodules appear larger, others are stable or smaller. Tiffany [name] says she would like to continue systemic chemotherapy.  She will return for an office visit as scheduled next week.  We will decide on continuing chemotherapy versus a hospice referral based on her status next week.  She will contact us in the interim for bleeding or new symptoms.   I discussed the assessment and treatment plan with the patient. The patient was provided an opportunity to ask questions and all were answered. The patient agreed with the plan and demonstrated an understanding of the instructions.   The patient was advised to call back or seek an in-person evaluation if the symptoms worsen or if the condition fails to improve as anticipated.  I provided 20 minutes of telephone/documentation time during this encounter, and > 50% was spent counseling as documented under my assessment & plan.  The encounter started as  a video WebEx, but the  patient was unable to access the meeting.  We converted to a phone visit.  Tiffany [name] ANP/GNP-BC   01/27/2019 1:37 PM

## 2019-01-28 ENCOUNTER — Emergency Department (HOSPITAL_COMMUNITY)
Admission: EM | Admit: 2019-01-28 | Discharge: 2019-01-28 | Disposition: A | Discharge status: Home / Self Care | Payer: Medicare Other | Attending: Emergency Medicine | Admitting: Emergency Medicine

## 2019-01-28 ENCOUNTER — Emergency Department (HOSPITAL_COMMUNITY): Payer: Medicare Other

## 2019-01-28 ENCOUNTER — Encounter (HOSPITAL_COMMUNITY): Payer: Self-pay

## 2019-01-28 ENCOUNTER — Other Ambulatory Visit: Payer: Self-pay

## 2019-01-28 DIAGNOSIS — F419 Anxiety disorder, unspecified: Secondary | ICD-10-CM | POA: Insufficient documentation

## 2019-01-28 DIAGNOSIS — I10 Essential (primary) hypertension: Secondary | ICD-10-CM | POA: Diagnosis not present

## 2019-01-28 DIAGNOSIS — R064 Hyperventilation: Secondary | ICD-10-CM | POA: Insufficient documentation

## 2019-01-28 DIAGNOSIS — Z7901 Long term (current) use of anticoagulants: Secondary | ICD-10-CM | POA: Insufficient documentation

## 2019-01-28 DIAGNOSIS — R0602 Shortness of breath: Secondary | ICD-10-CM | POA: Diagnosis not present

## 2019-01-28 DIAGNOSIS — Z85828 Personal history of other malignant neoplasm of skin: Secondary | ICD-10-CM | POA: Diagnosis not present

## 2019-01-28 DIAGNOSIS — Z8507 Personal history of malignant neoplasm of pancreas: Secondary | ICD-10-CM | POA: Insufficient documentation

## 2019-01-28 DIAGNOSIS — Z79899 Other long term (current) drug therapy: Secondary | ICD-10-CM | POA: Insufficient documentation

## 2019-01-28 LAB — CBC WITH DIFFERENTIAL/PLATELET
Abs Immature Granulocytes: 0.04 10*3/uL (ref 0.00–0.07)
Basophils Absolute: 0 10*3/uL (ref 0.0–0.1)
Basophils Relative: 0 %
Eosinophils Absolute: 0.1 10*3/uL (ref 0.0–0.5)
Eosinophils Relative: 2 %
HCT: 36.9 % (ref 36.0–46.0)
Hemoglobin: 12.5 g/dL (ref 12.0–15.0)
Immature Granulocytes: 1 %
Lymphocytes Relative: 12 %
Lymphs Abs: 0.7 10*3/uL (ref 0.7–4.0)
MCH: 30.4 pg (ref 26.0–34.0)
MCHC: 33.9 g/dL (ref 30.0–36.0)
MCV: 89.8 fL (ref 80.0–100.0)
Monocytes Absolute: 0.6 10*3/uL (ref 0.1–1.0)
Monocytes Relative: 10 %
Neutro Abs: 4.5 10*3/uL (ref 1.7–7.7)
Neutrophils Relative %: 75 %
Platelets: 213 10*3/uL (ref 150–400)
RBC: 4.11 MIL/uL (ref 3.87–5.11)
RDW: 18.3 % — ABNORMAL HIGH (ref 11.5–15.5)
WBC: 6.1 10*3/uL (ref 4.0–10.5)
nRBC: 0 % (ref 0.0–0.2)

## 2019-01-28 LAB — COMPREHENSIVE METABOLIC PANEL
ALT: 27 U/L (ref 0–44)
AST: 41 U/L (ref 15–41)
Albumin: 2.9 g/dL — ABNORMAL LOW (ref 3.5–5.0)
Alkaline Phosphatase: 70 U/L (ref 38–126)
Anion gap: 11 (ref 5–15)
BUN: 11 mg/dL (ref 8–23)
CO2: 22 mmol/L (ref 22–32)
Calcium: 9.4 mg/dL (ref 8.9–10.3)
Chloride: 103 mmol/L (ref 98–111)
Creatinine, Ser: 0.83 mg/dL (ref 0.44–1.00)
GFR calc Af Amer: >60 mL/min (ref >60)
GFR calc non Af Amer: >60 mL/min (ref >60)
Glucose, Bld: 123 mg/dL — ABNORMAL HIGH (ref 70–99)
Potassium: 4.4 mmol/L (ref 3.5–5.1)
Sodium: 136 mmol/L (ref 135–145)
Total Bilirubin: 1.1 mg/dL (ref 0.3–1.2)
Total Protein: 5.7 g/dL — ABNORMAL LOW (ref 6.5–8.1)

## 2019-01-28 LAB — BRAIN NATRIURETIC PEPTIDE: B Natriuretic Peptide: 116.9 pg/mL — ABNORMAL HIGH (ref 0.0–100.0)

## 2019-01-28 LAB — TROPONIN I
Troponin I: 0.06 ng/mL (ref <0.03)
Troponin I: 0.05 ng/mL (ref <0.03)

## 2019-01-28 LAB — PROTIME-INR
INR: 2.3 — ABNORMAL HIGH (ref 0.8–1.2)
Prothrombin Time: 24.5 seconds — ABNORMAL HIGH (ref 11.4–15.2)

## 2019-01-28 NOTE — ED Provider Notes (Signed)
[facility] DEPT Provider Note   CSN: [id number] Arrival date & time: 01/28/19  0152    History   Chief Complaint Chief Complaint  Patient presents with   Shortness of Breath    HPI Tiffany [name] is a 85 y.o. female with a history of metastatic adenocarcinoma pancreatic cancer on chemotherapy, hypertension, hypertensive obstructive cardiomyopathy, and PE on Eliquis who presents to the emergency department with a chief complaint of shortness of breath.  The patient endorses sudden onset shortness of breath that began just prior to arrival.  She reports that she was laying in bed when she suddenly became short of breath.  Per chart review, it appears the patient presented similarly when she was diagnosed with bilateral PEs on 01/23/2019.  She is on Eliquis.  No recent missed doses.  On initial examination, the patient is hyperventilating, which resolves with redirection and deep breathing.  She is satting at 99% with good waveform on room air.  She has no tachycardia and is normotensive.  Afebrile.   She denies chest pain, nausea, vomiting, fever, chills, cough, leg swelling, numbness, weakness, abdominal pain, back pain, and sore throat.   No treatment prior to arrival.      Shortness of Breath  Associated symptoms: no abdominal pain, no chest pain, no cough, no fever, no headaches, no rash, no vomiting and no wheezing     Past Medical History:  Diagnosis Date   Aortic stenosis, mild    ECHO 09   Bilateral cataracts    DOE (dyspnea on exertion)    Facial basal cell cancer    nose-/dr, Allyson Sabal   Family history of breast cancer    Family history of colon cancer    Family history of malignant melanoma    Hearing loss in left ear    resolved after stapedectomy   Heart murmur    History of colon polyps    Hyperlipidemia    Hypertension    Hypertrophic obstructive cardiomyopathy (HCC)    Mass of pancreas    OA (osteoarthritis)     Osteoporosis    Retinal vein occlusion    Dr. Starling Manns, Left eye, receives injections   Varicose veins    Vitamin D deficiency     Patient Active Problem List   Diagnosis Date Noted   Bilateral pulmonary embolism ([place]) 01/23/2019   Malignant neoplasm of body of pancreas ([facility]) 12/15/2018   Goals of care, counseling/discussion 12/15/2018   Genetic testing 12/13/2018   Family history of breast cancer    Family history of colon cancer    Family history of malignant melanoma    Mass of pancreas    Epidermal cyst of neck 04/06/2018   Hypertrophic obstructive cardiomyopathy ([place]) 08/03/2016   Essential hypertension 08/03/2016   Shortness of breath 06/16/2016    Past Surgical History:  Procedure Laterality Date   COLONOSCOPY  04/2000   COLONOSCOPY W/ POLYPECTOMY  1988   CYST EXCISION N/A 04/07/2018   Procedure: EXCISION EPIDERMAL CYST POSTERIOR NECK;  Surgeon: Armandina Gemma, MD;  Location: [facility];  Service: General;  Laterality: N/A;   ELBOW SURGERY     EXTERNAL EAR SURGERY Right 1974   Stapedectomy   EYE SURGERY Bilateral    cataract   IR IMAGING GUIDED PORT INSERTION  12/19/2018   SHOULDER SURGERY     VAGINAL HYSTERECTOMY     without oopherectomy for birth control age 44     OB History   No obstetric history  on file.      Home Medications    Prior to Admission medications   Medication Sig Start Date End Date Taking? Authorizing Provider  acetaminophen (TYLENOL) 500 MG tablet Take 1,000 mg by mouth every 6 (six) hours as needed for moderate pain.   Yes [provider]  Cholecalciferol (VITAMIN D) 2000 units CAPS Take 2,000 Units by mouth daily.    Yes [provider]  diltiazem (CARDIZEM CD) 180 MG 24 hr capsule Take 1 capsule (180 mg total) by mouth daily. 04/12/18  Yes Belva Crome, MD  Eliquis DVT/PE Starter Pack [facility] STARTER PACK) 5 MG TABS Take as directed on package: start with two-5mg  tablets twice  daily for 7 days. On day 8, switch to one-5mg  tablet twice daily. Patient taking differently: Take 5-10 mg by mouth See admin instructions. Take as directed on package: start with two-5mg  tablets twice daily for 7 days. On day 8, switch to one-5mg  tablet twice daily. 01/24/19  Yes Pahwani, Einar Grad, MD  Misc Natural Products (OSTEO BI-FLEX ADV DOUBLE ST PO) Take 1 tablet by mouth daily.   Yes [provider]  Multiple Vitamins-Minerals (PRESERVISION AREDS) CAPS Take 1 capsule by mouth daily.   Yes [provider]  Polyethylene Glycol 3350 (MIRALAX PO) Take 17 g by mouth daily as needed (constipation).  12/15/18  Yes Ladell Pier, MD  lidocaine-prilocaine (EMLA) cream Apply 1 application topically as needed. 12/15/18   Ladell Pier, MD    Family History Family History  Problem Relation Age of Onset   Melanoma Mother        dx 26s, d. 67   Cerebral aneurysm Mother    Hypertension Mother    CAD Father    Breast cancer Sister 58   Colon cancer Brother 26   Hypertension Brother    Hypertension Son    Hypertension Son     Social History Social History   Tobacco Use   Smoking status: Never Smoker   Smokeless tobacco: Never Used  Substance Use Topics   Alcohol use: Yes    Comment: occ   Drug use: No     Allergies   Other   Review of Systems Review of Systems  Constitutional: Negative for activity change, chills and fever.  Respiratory: Positive for shortness of breath. Negative for cough and wheezing.   Cardiovascular: Negative for chest pain, palpitations and leg swelling.  Gastrointestinal: Negative for abdominal pain, anal bleeding, diarrhea, nausea and vomiting.  Genitourinary: Negative for dysuria.  Musculoskeletal: Negative for back pain.  Skin: Negative for rash.  Allergic/Immunologic: Negative for immunocompromised state.  Neurological: Negative for dizziness, syncope, weakness, numbness and headaches.  Psychiatric/Behavioral: Negative  for confusion.   Physical Exam Updated Vital Signs BP 121/69 (BP Location: Left Arm)    Pulse 90    Temp 98.5 F (36.9 C) (Oral)    Resp 20    SpO2 99%   Physical Exam Vitals signs and nursing note reviewed.  Constitutional:      General: She is not in acute distress.    Appearance: She is not ill-appearing, toxic-appearing or diaphoretic.     Comments: Cachectic, elderly female.  HENT:     Head: Normocephalic.  Eyes:     Extraocular Movements: Extraocular movements intact.     Conjunctiva/sclera: Conjunctivae normal.     Pupils: Pupils are equal, round, and reactive to light.  Neck:     Musculoskeletal: Neck supple.  Cardiovascular:     Rate and  Rhythm: Normal rate and regular rhythm.     Heart sounds: No murmur. No friction rub. No gallop.   Pulmonary:     Effort: Pulmonary effort is normal. No respiratory distress.     Breath sounds: No wheezing, rhonchi or rales.     Comments: Lungs are clear to auscultation bilaterally.  Patient was initially hyperventilating, but continued to demonstrate an SaO2 of 99% with good waveform.  No tachycardia.  Patient is normotensive.  Hyperventilation improved with redirection and patient appears much more comfortable and most instantly.  Patient has a nonproductive cough with expiration.  Otherwise, no coughing is observed. Abdominal:     General: There is no distension.     Palpations: Abdomen is soft. There is no mass.     Tenderness: There is no abdominal tenderness. There is no right CVA tenderness, left CVA tenderness, guarding or rebound.     Hernia: No hernia is present.  Skin:    General: Skin is warm.     Findings: No rash.  Neurological:     Mental Status: She is alert.  Psychiatric:        Mood and Affect: Mood is anxious.        Behavior: Behavior normal.      ED Treatments / Results  Labs (all labs ordered are listed, but only abnormal results are displayed) Labs Reviewed  TROPONIN I - Abnormal; Notable for the  following components:      Result Value   Troponin I 0.06 (*)    All other components within normal limits  CBC WITH DIFFERENTIAL/PLATELET - Abnormal; Notable for the following components:   RDW 18.3 (*)    All other components within normal limits  COMPREHENSIVE METABOLIC PANEL - Abnormal; Notable for the following components:   Glucose, Bld 123 (*)    Total Protein 5.7 (*)    Albumin 2.9 (*)    All other components within normal limits  PROTIME-INR - Abnormal; Notable for the following components:   Prothrombin Time 24.5 (*)    INR 2.3 (*)    All other components within normal limits  BRAIN NATRIURETIC PEPTIDE - Abnormal; Notable for the following components:   B Natriuretic Peptide 116.9 (*)    All other components within normal limits  TROPONIN I - Abnormal; Notable for the following components:   Troponin I 0.05 (*)    All other components within normal limits    EKG None  Radiology Dg Chest 2 View  Result Date: 01/28/2019 CLINICAL DATA:  85 y/o F; shortness of breath starting earlier tonight. History of pancreatic cancer. Admitted last week with bilateral pulmonary emboli. EXAM: CHEST - 2 VIEW COMPARISON:  01/23/2019 chest radiograph and CT chest. FINDINGS: Stable cardiac silhouette given projection and technique. Right port catheter tip projects over the mid SVC. Numerous pulmonary nodular opacities compatible with metastatic disease are stable. Small right and trace left pleural effusion. Aortic atherosclerosis with calcification. No pneumothorax. No acute osseous abnormality is evident. IMPRESSION: Numerous pulmonary nodular opacities compatible with metastatic disease are stable. Stable small right and trace left pleural effusions. No new focal consolidation. Electronically Signed   By: Kristine Garbe M.D.   On: 01/28/2019 02:35    Procedures Procedures (including critical care time)  Medications Ordered in ED Medications - No data to display   Initial  Impression / Assessment and Plan / ED Course  I have reviewed the triage vital signs and the nursing notes.  Pertinent labs & imaging results that  were available during my care of the patient were reviewed by me and considered in my medical decision making (see chart for details).        85 year old female with a history of metastatic adenocarcinoma pancreatic cancer on chemotherapy, hypertension, hypertensive obstructive cardiomyopathy, and PE on Eliquis presenting from home with shortness of breath that began just prior to arrival.  Due to COVID-19, visitor restrictions are in place.  Patient lives with her daughter,Terry.  Terry's number is not listed as the emergency contact on her chart.  Attempted to call the patient's emergency contact, Christia Reading, multiple times by both myself and nursing staff without success.  Patient does not know Terry's number and does not have her cell phone with her.  For most of the duration of her ER visit, family is unable to be contacted.  On my initial evaluation, patient is very anxious appearing and is hyperventilating.  Hyperventilation improves with deep breathing exercises and patient appears much more comfortable.  She was recently discharged after being diagnosed with bilateral PEs.  She has been compliant with her home Eliquis and denies recent missed doses.  She continues to deny chest pain at this time.  Chest x-ray with numerous pulmonary nodular opacities compatible with metastatic disease.  Chest x-ray is unchanged from previous.  BNP is minimally elevated.  Initial troponin is 0.06, which I suspect is secondary to demand from known bilateral PEs.  Troponin has not been up trending from previous ER visit.  Delta troponin is minimally decreased at 0.05.  She has no leukocytosis.  Have a low suspicion for obstructive pneumonia given history of present illness and no change on chest x-ray as well as no leukocytosis.  Metabolic panel is grossly unremarkable.   INR is 2.3, significantly up from 1.14 days ago, which may suggest the patient has been compliant with Eliquis.  The patient was seen and evaluated independently by Dr. Leonette Monarch, attending physician.  He reports that on his initial evaluation the patient also appears anxious and is hyperventilating and is easily redirected.  Given compliance and stable vital signs, have a low suspicion for worsening PE burden at this time.  Since troponin is not uptrending, also low suspicion for ACS is STEMI at this time.  Work-up does not suggest obstructive pneumonia.  Doubt aortic dissection, esophageal rupture, or COVID-19.  I was finally able to reach the patient's daughter, [name].  The patient's daughter reports that she took her mother to the ER because she was concerned that "her blood clots had returned because this is how she presented for her last ER visit".  We discussed that PE resolution can take time despite compliance with Eliquis.  Also discussed redirection and hyperventilation techniques.  All questions were answered.  At this time, I feel that the patient is hemodynamically stable and in no acute distress.  Safe for discharge to home with outpatient follow-up.  Final Clinical Impressions(s) / ED Diagnoses   Final diagnoses:  Shortness of breath    ED Discharge Orders    None       Joanne Gavel, PA-C 01/28/19 0836    Fatima Blank, MD 01/29/19 [phone]

## 2019-01-28 NOTE — Discharge Instructions (Addendum)
Thank you for allowing me to care for you today in the Emergency Department.   Your workup in the ER today was reassuring for your shortness of breath.  It is very important that you continue to take Eliquis at home as you are directed.  Although you are on a blood thinner, a blood clot in your lungs does not immediately resolve.  Sometimes it can take as long as several months to resolve.  The most important thing you can do is make sure you do not miss any doses of your blood thinner.  Keep your follow-up appointment with your oncologist.  Sometimes deep, focused breathing exercises can help with the shortness of breath.  Breathing through the nose and out through the mouth for a count of 10 can sometimes help with shortness of breath.  Return to the emergency department if you develop shortness of breath that is accompanied by chest pain, high fever, a thick cough with sputum, if your lips or fingers turn blue, or if you develop other new, concerning symptoms.

## 2019-01-28 NOTE — ED Triage Notes (Signed)
Pt reports SOB that started earlier tonight. She is tachypneic and taking shallow breaths. In triage, she was able to be coached to take slower longer breaths. Her SpO2 was high 90s. Pt was admitted last week with bilateral PEs. Hx of pancreatic cancer.

## 2019-02-03 ENCOUNTER — Telehealth: Payer: Self-pay | Admitting: Nurse Practitioner

## 2019-02-03 ENCOUNTER — Other Ambulatory Visit: Payer: Self-pay

## 2019-02-03 ENCOUNTER — Inpatient Hospital Stay: Payer: Medicare Other

## 2019-02-03 ENCOUNTER — Encounter: Payer: Self-pay | Admitting: Nurse Practitioner

## 2019-02-03 ENCOUNTER — Inpatient Hospital Stay (HOSPITAL_BASED_OUTPATIENT_CLINIC_OR_DEPARTMENT_OTHER): Payer: Medicare Other | Admitting: Nurse Practitioner

## 2019-02-03 VITALS — BP 102/65 | HR 105 | Temp 98.3°F

## 2019-02-03 VITALS — HR 92

## 2019-02-03 DIAGNOSIS — C251 Malignant neoplasm of body of pancreas: Secondary | ICD-10-CM

## 2019-02-03 DIAGNOSIS — C786 Secondary malignant neoplasm of retroperitoneum and peritoneum: Secondary | ICD-10-CM

## 2019-02-03 DIAGNOSIS — Z95828 Presence of other vascular implants and grafts: Secondary | ICD-10-CM | POA: Insufficient documentation

## 2019-02-03 DIAGNOSIS — Z5111 Encounter for antineoplastic chemotherapy: Secondary | ICD-10-CM | POA: Diagnosis not present

## 2019-02-03 DIAGNOSIS — R918 Other nonspecific abnormal finding of lung field: Secondary | ICD-10-CM | POA: Diagnosis not present

## 2019-02-03 DIAGNOSIS — C258 Malignant neoplasm of overlapping sites of pancreas: Secondary | ICD-10-CM

## 2019-02-03 DIAGNOSIS — E041 Nontoxic single thyroid nodule: Secondary | ICD-10-CM

## 2019-02-03 DIAGNOSIS — G893 Neoplasm related pain (acute) (chronic): Secondary | ICD-10-CM

## 2019-02-03 DIAGNOSIS — R05 Cough: Secondary | ICD-10-CM | POA: Diagnosis not present

## 2019-02-03 DIAGNOSIS — Z79899 Other long term (current) drug therapy: Secondary | ICD-10-CM | POA: Diagnosis not present

## 2019-02-03 LAB — CBC WITH DIFFERENTIAL (CANCER CENTER ONLY)
Abs Immature Granulocytes: 0.11 10*3/uL — ABNORMAL HIGH (ref 0.00–0.07)
Basophils Absolute: 0.1 10*3/uL (ref 0.0–0.1)
Basophils Relative: 1 %
Eosinophils Absolute: 0.2 10*3/uL (ref 0.0–0.5)
Eosinophils Relative: 4 %
HCT: 37.1 % (ref 36.0–46.0)
Hemoglobin: 12.4 g/dL (ref 12.0–15.0)
Immature Granulocytes: 2 %
Lymphocytes Relative: 11 %
Lymphs Abs: 0.7 10*3/uL (ref 0.7–4.0)
MCH: 29.9 pg (ref 26.0–34.0)
MCHC: 33.4 g/dL (ref 30.0–36.0)
MCV: 89.4 fL (ref 80.0–100.0)
Monocytes Absolute: 0.8 10*3/uL (ref 0.1–1.0)
Monocytes Relative: 13 %
Neutro Abs: 4.3 10*3/uL (ref 1.7–7.7)
Neutrophils Relative %: 69 %
Platelet Count: 269 10*3/uL (ref 150–400)
RBC: 4.15 MIL/uL (ref 3.87–5.11)
RDW: 20.4 % — ABNORMAL HIGH (ref 11.5–15.5)
WBC Count: 6.2 10*3/uL (ref 4.0–10.5)
nRBC: 0 % (ref 0.0–0.2)

## 2019-02-03 LAB — CMP (CANCER CENTER ONLY)
ALT: 19 U/L (ref 0–44)
AST: 21 U/L (ref 15–41)
Albumin: 2.7 g/dL — ABNORMAL LOW (ref 3.5–5.0)
Alkaline Phosphatase: 74 U/L (ref 38–126)
Anion gap: 8 (ref 5–15)
BUN: 12 mg/dL (ref 8–23)
CO2: 26 mmol/L (ref 22–32)
Calcium: 9.2 mg/dL (ref 8.9–10.3)
Chloride: 103 mmol/L (ref 98–111)
Creatinine: 0.8 mg/dL (ref 0.44–1.00)
GFR, Est AFR Am: >60 mL/min (ref >60)
GFR, Estimated: >60 mL/min (ref >60)
Glucose, Bld: 124 mg/dL — ABNORMAL HIGH (ref 70–99)
Potassium: 4 mmol/L (ref 3.5–5.1)
Sodium: 137 mmol/L (ref 135–145)
Total Bilirubin: 0.6 mg/dL (ref 0.3–1.2)
Total Protein: 5.4 g/dL — ABNORMAL LOW (ref 6.5–8.1)

## 2019-02-03 MED ORDER — SODIUM CHLORIDE 0.9% FLUSH
10.0000 mL | INTRAVENOUS | Status: DC | PRN
  Administered 2019-02-03: 10 mL
  Filled 2019-02-03: qty 10

## 2019-02-03 MED ORDER — PREDNISONE 10 MG PO TABS: 10.0000 mg | ORAL_TABLET | Freq: Every day | ORAL | 1 refills | Status: DC

## 2019-02-03 MED ORDER — HEPARIN SOD (PORK) LOCK FLUSH 100 UNIT/ML IV SOLN
500.0000 [IU] | Freq: Once | INTRAVENOUS | Status: AC | PRN
  Administered 2019-02-03: 500 [IU]
  Filled 2019-02-03: qty 5

## 2019-02-03 MED ORDER — PROCHLORPERAZINE MALEATE 10 MG PO TABS
5.0000 mg | ORAL_TABLET | Freq: Once | ORAL | Status: AC
  Administered 2019-02-03: 5 mg via ORAL

## 2019-02-03 MED ORDER — PACLITAXEL PROTEIN-BOUND CHEMO INJECTION 100 MG
100.0000 mg/m2 | Freq: Once | INTRAVENOUS | Status: AC
  Administered 2019-02-03: 150 mg via INTRAVENOUS
  Filled 2019-02-03: qty 30

## 2019-02-03 MED ORDER — PROCHLORPERAZINE MALEATE 10 MG PO TABS
ORAL_TABLET | ORAL | Status: AC
  Filled 2019-02-03: qty 1

## 2019-02-03 MED ORDER — SODIUM CHLORIDE 0.9 % IV SOLN
800.0000 mg/m2 | Freq: Once | INTRAVENOUS | Status: AC
  Administered 2019-02-03: 12:00:00 1330 mg via INTRAVENOUS
  Filled 2019-02-03: qty 34.98

## 2019-02-03 MED ORDER — SODIUM CHLORIDE 0.9 % IV SOLN
Freq: Once | INTRAVENOUS | Status: AC
  Administered 2019-02-03: 11:00:00 via INTRAVENOUS
  Filled 2019-02-03: qty 250

## 2019-02-03 NOTE — Patient Instructions (Signed)
[facility] Discharge Instructions for Patients Receiving Chemotherapy  Today you received the following chemotherapy agents abraxane and gemzar  To help prevent nausea and vomiting after your treatment, we encourage you to take your nausea medication as directed  If you develop nausea and vomiting that is not controlled by your nausea medication, call the clinic.   BELOW ARE SYMPTOMS THAT SHOULD BE REPORTED IMMEDIATELY:  *FEVER GREATER THAN 100.5 F  *CHILLS WITH OR WITHOUT FEVER  NAUSEA AND VOMITING THAT IS NOT CONTROLLED WITH YOUR NAUSEA MEDICATION  *UNUSUAL SHORTNESS OF BREATH  *UNUSUAL BRUISING OR BLEEDING  TENDERNESS IN MOUTH AND THROAT WITH OR WITHOUT PRESENCE OF ULCERS  *URINARY PROBLEMS  *BOWEL PROBLEMS  UNUSUAL RASH Items with * indicate a potential emergency and should be followed up as soon as possible.  Feel free to call the clinic should you have any questions or concerns. The clinic phone number is [phone].  Please show the [facility] at check-in to the Emergency Department and triage nurse.  Nanoparticle Albumin-Bound Paclitaxel injection (Abraxane) What is this medicine? NANOPARTICLE ALBUMIN-BOUND PACLITAXEL (Na no PAHR ti kuhl al BYOO muhn-bound PAK li TAX el) is a chemotherapy drug. It targets fast dividing cells, like cancer cells, and causes these cells to die. This medicine is used to treat advanced breast cancer, lung cancer, and pancreatic cancer. This medicine may be used for other purposes; ask your health care provider or pharmacist if you have questions. COMMON BRAND NAME(S): Abraxane What should I tell my health care provider before I take this medicine? They need to know if you have any of these conditions: -kidney disease -liver disease -low blood counts, like low white cell, platelet, or red cell counts -lung or breathing disease, like asthma -tingling of the fingers or toes, or other nerve disorder -an unusual or  allergic reaction to paclitaxel, albumin, other chemotherapy, other medicines, foods, dyes, or preservatives -pregnant or trying to get pregnant -breast-feeding How should I use this medicine? This drug is given as an infusion into a vein. It is administered in a hospital or clinic by a specially trained health care professional. Talk to your pediatrician regarding the use of this medicine in children. Special care may be needed. Overdosage: If you think you have taken too much of this medicine contact a poison control center or emergency room at once. NOTE: This medicine is only for you. Do not share this medicine with others. What if I miss a dose? It is important not to miss your dose. Call your doctor or health care professional if you are unable to keep an appointment. What may interact with this medicine? This medicine may interact with the following medications: -antiviral medicines for hepatitis, HIV or AIDS -certain antibiotics like erythromycin and clarithromycin -certain medicines for fungal infections like ketoconazole and itraconazole -certain medicines for seizures like carbamazepine, phenobarbital, phenytoin -gemfibrozil -nefazodone -rifampin -St. John's wort This list may not describe all possible interactions. Give your health care provider a list of all the medicines, herbs, non-prescription drugs, or dietary supplements you use. Also tell them if you smoke, drink alcohol, or use illegal drugs. Some items may interact with your medicine. What should I watch for while using this medicine? Your condition will be monitored carefully while you are receiving this medicine. You will need important blood work done while you are taking this medicine. This medicine can cause serious allergic reactions. If you experience allergic reactions like skin rash, itching or hives, swelling of  the face, lips, or tongue, tell your doctor or health care professional right away. In some cases,  you may be given additional medicines to help with side effects. Follow all directions for their use. This drug may make you feel generally unwell. This is not uncommon, as chemotherapy can affect healthy cells as well as cancer cells. Report any side effects. Continue your course of treatment even though you feel ill unless your doctor tells you to stop. Call your doctor or health care professional for advice if you get a fever, chills or sore throat, or other symptoms of a cold or flu. Do not treat yourself. This drug decreases your body's ability to fight infections. Try to avoid being around people who are sick. This medicine may increase your risk to bruise or bleed. Call your doctor or health care professional if you notice any unusual bleeding. Be careful brushing and flossing your teeth or using a toothpick because you may get an infection or bleed more easily. If you have any dental work done, tell your dentist you are receiving this medicine. Avoid taking products that contain aspirin, acetaminophen, ibuprofen, naproxen, or ketoprofen unless instructed by your doctor. These medicines may hide a fever. Do not become pregnant while taking this medicine or for 6 months after stopping it. Women should inform their doctor if they wish to become pregnant or think they might be pregnant. Men should not father a child while taking this medicine or for 3 months after stopping it. There is a potential for serious side effects to an unborn child. Talk to your health care professional or pharmacist for more information. Do not breast-feed an infant while taking this medicine or for 2 weeks after stopping it. This medicine may interfere with the ability to get pregnant or to father a child. You should talk to your doctor or health care professional if you are concerned about your fertility. What side effects may I notice from receiving this medicine? Side effects that you should report to your doctor or  health care professional as soon as possible: -allergic reactions like skin rash, itching or hives, swelling of the face, lips, or tongue -breathing problems -changes in vision -fast, irregular heartbeat -low blood pressure -mouth sores -pain, tingling, numbness in the hands or feet -signs of decreased platelets or bleeding - bruising, pinpoint red spots on the skin, black, tarry stools, blood in the urine -signs of decreased red blood cells - unusually weak or tired, feeling faint or lightheaded, falls -signs of infection - fever or chills, cough, sore throat, pain or difficulty passing urine -signs and symptoms of liver injury like dark yellow or brown urine; general ill feeling or flu-like symptoms; light-colored stools; loss of appetite; nausea; right upper belly pain; unusually weak or tired; yellowing of the eyes or skin -swelling of the ankles, feet, hands -unusually slow heartbeat Side effects that usually do not require medical attention (report to your doctor or health care professional if they continue or are bothersome): -diarrhea -hair loss -loss of appetite -nausea, vomiting -tiredness This list may not describe all possible side effects. Call your doctor for medical advice about side effects. You may report side effects to FDA at 1-800-FDA-1088. Where should I keep my medicine? This drug is given in a hospital or clinic and will not be stored at home. NOTE: This sheet is a summary. It may not cover all possible information. If you have questions about this medicine, talk to your doctor, pharmacist, or health care   provider.  2019 Elsevier/Gold Standard (2017-05-11 13:03:45)   Gemcitabine injection(Gemzar) What is this medicine? GEMCITABINE (jem SYE ta been) is a chemotherapy drug. This medicine is used to treat many types of cancer like breast cancer, lung cancer, pancreatic cancer, and ovarian cancer. This medicine may be used for other purposes; ask your health care  provider or pharmacist if you have questions. COMMON BRAND NAME(S): Gemzar, Infugem What should I tell my health care provider before I take this medicine? They need to know if you have any of these conditions: -blood disorders -infection -kidney disease -liver disease -lung or breathing disease, like asthma -recent or ongoing radiation therapy -an unusual or allergic reaction to gemcitabine, other chemotherapy, other medicines, foods, dyes, or preservatives -pregnant or trying to get pregnant -breast-feeding How should I use this medicine? This drug is given as an infusion into a vein. It is administered in a hospital or clinic by a specially trained health care professional. Talk to your pediatrician regarding the use of this medicine in children. Special care may be needed. Overdosage: If you think you have taken too much of this medicine contact a poison control center or emergency room at once. NOTE: This medicine is only for you. Do not share this medicine with others. What if I miss a dose? It is important not to miss your dose. Call your doctor or health care professional if you are unable to keep an appointment. What may interact with this medicine? -medicines to increase blood counts like filgrastim, pegfilgrastim, sargramostim -some other chemotherapy drugs like cisplatin -vaccines Talk to your doctor or health care professional before taking any of these medicines: -acetaminophen -aspirin -ibuprofen -ketoprofen -naproxen This list may not describe all possible interactions. Give your health care provider a list of all the medicines, herbs, non-prescription drugs, or dietary supplements you use. Also tell them if you smoke, drink alcohol, or use illegal drugs. Some items may interact with your medicine. What should I watch for while using this medicine? Visit your doctor for checks on your progress. This drug may make you feel generally unwell. This is not uncommon, as  chemotherapy can affect healthy cells as well as cancer cells. Report any side effects. Continue your course of treatment even though you feel ill unless your doctor tells you to stop. In some cases, you may be given additional medicines to help with side effects. Follow all directions for their use. Call your doctor or health care professional for advice if you get a fever, chills or sore throat, or other symptoms of a cold or flu. Do not treat yourself. This drug decreases your body's ability to fight infections. Try to avoid being around people who are sick. This medicine may increase your risk to bruise or bleed. Call your doctor or health care professional if you notice any unusual bleeding. Be careful brushing and flossing your teeth or using a toothpick because you may get an infection or bleed more easily. If you have any dental work done, tell your dentist you are receiving this medicine. Avoid taking products that contain aspirin, acetaminophen, ibuprofen, naproxen, or ketoprofen unless instructed by your doctor. These medicines may hide a fever. Do not become pregnant while taking this medicine or for 6 months after stopping it. Women should inform their doctor if they wish to become pregnant or think they might be pregnant. Men should not father a child while taking this medicine and for 3 months after stopping it. There is a potential for serious side  effects to an unborn child. Talk to your health care professional or pharmacist for more information. Do not breast-feed an infant while taking this medicine or for at least 1 week after stopping it. Men should inform their doctors if they wish to father a child. This medicine may lower sperm counts. Talk with your doctor or health care professional if you are concerned about your fertility. What side effects may I notice from receiving this medicine? Side effects that you should report to your doctor or health care professional as soon as  possible: -allergic reactions like skin rash, itching or hives, swelling of the face, lips, or tongue -breathing problems -pain, redness, or irritation at site where injected -signs and symptoms of a dangerous change in heartbeat or heart rhythm like chest pain; dizziness; fast or irregular heartbeat; palpitations; feeling faint or lightheaded, falls; breathing problems -signs of decreased platelets or bleeding - bruising, pinpoint red spots on the skin, black, tarry stools, blood in the urine -signs of decreased red blood cells - unusually weak or tired, feeling faint or lightheaded, falls -signs of infection - fever or chills, cough, sore throat, pain or difficulty passing urine -signs and symptoms of kidney injury like trouble passing urine or change in the amount of urine -signs and symptoms of liver injury like dark yellow or brown urine; general ill feeling or flu-like symptoms; light-colored stools; loss of appetite; nausea; right upper belly pain; unusually weak or tired; yellowing of the eyes or skin -swelling of ankles, feet, hands Side effects that usually do not require medical attention (report to your doctor or health care professional if they continue or are bothersome): -constipation -diarrhea -hair loss -loss of appetite -nausea -rash -vomiting This list may not describe all possible side effects. Call your doctor for medical advice about side effects. You may report side effects to FDA at 1-800-FDA-1088. Where should I keep my medicine? This drug is given in a hospital or clinic and will not be stored at home. NOTE: This sheet is a summary. It may not cover all possible information. If you have questions about this medicine, talk to your doctor, pharmacist, or health care provider.  2019 Elsevier/Gold Standard (2017-12-01 18:06:11)

## 2019-02-03 NOTE — Progress Notes (Addendum)
  [facility] OFFICE PROGRESS NOTE   Diagnosis: Pancreas cancer  INTERVAL HISTORY:   Tiffany [name] returns as scheduled.  She completed cycle 3 gemcitabine/Abraxane 01/20/2019.  She was admitted with multifocal acute pulmonary emboli 01/23/2019.  She was discharged home on Eliquis.  She was seen in the emergency department 01/28/2019 with shortness of breath.  She was evaluated/monitored for several hours and felt to be appropriate for discharge home.  She denies nausea/vomiting.  She denies abdominal pain.  No mouth sores.  No diarrhea.  She denies shortness of breath.  No cough or fever.  No rash after treatment.  She is eating "okay".  She continues to feel weak.  Objective:  Vital signs in last 24 hours:  Blood pressure 102/65, pulse (!) 105, temperature 98.3 F (36.8 C), temperature source Oral.    HEENT: No thrush or ulcers.  Mucous membranes appear moist. Vascular: Trace edema at the lower legs bilaterally.  Calves soft and nontender.  Port-A-Cath without erythema.  Lab Results:  Lab Results  Component Value Date   WBC 6.2 02/03/2019   HGB 12.4 02/03/2019   HCT 37.1 02/03/2019   MCV 89.4 02/03/2019   PLT 269 02/03/2019   NEUTROABS 4.3 02/03/2019    Imaging:  No results found.  Medications: I have reviewed the patient's current medications.  Assessment/Plan: 1. Pancreas mass/lung nodules/peritoneal nodules  Abdominal ultrasound 11/25/2018-1.0 cm pancreatic head cyst and a 5.5 mm lymph node adjacent to the pancreatic head.   MRI abdomen 11/25/2018-irregular poorly marginated hypoenhancing 3.2 x 2.2 cm posterior pancreatic body mass encasing the celiac artery; separate 1.4 cm pancreatic neck and 1.2 cm posterior pancreatic head cystic masses; multiple lung masses/pulmonary nodules scattered throughout both lung bases measuring up to 3.5 cm; extensive peritoneal carcinomatosis with soft tissue metastases throughout the omentum and perihepatic space; trace ascites.   CTs 12/06/2018-large left thyroid nodule, bilateral pulmonary nodules, pancreas body/tail mass with involvement of the celiac axis, peritoneal nodularity, omental caking at the anterior abdominal wall  CT biopsy of an omental nodule 12/12/2018-metastatic adenocarcinoma consistent with pancreas cancer  Cycle 1 gemcitabine/Abraxane 12/22/2018  Cycle 2 gemcitabine/Abraxane 01/05/2019  Common hereditary cancer panel-negative  Cycle3gemcitabine/Abraxane 01/20/2019 2. Abdominal pain secondary to #1-improved 3. Hypertension 4. Hypertrophic obstructive cardiomyopathy 5. Cough-likely secondary to pulmonary metastases 6. Hypercalcemia 12/22/2018 status post Zometa 7. Anorexia secondary to #1 8. Multifocal acute pulmonary emboli diagnosed on CT 01/23/2019- treated with heparin and discharged on Eliquis   Disposition: Tiffany [name] appears stable.  She has completed 3 cycles of gemcitabine/Abraxane.  Plan to proceed with cycle 4 today as scheduled.  We reviewed the CBC from today.  Counts are adequate for treatment.  She will be seen in a video visit in 1 week.  She will return for lab, follow-up and chemotherapy in 2 weeks.  She will contact the office in the interim with any problems.  Patient seen with Dr. Benay Spice.  Tiffany [name] daughter was on the phone for today's office visit.    Ned Card ANP/GNP-BC   02/03/2019  10:12 AM This was a shared visit with Ned Card.  Tiffany [name] has a stable versus mildly improved performance status.  She would like to continue chemotherapy.  She will complete another cycle of gemcitabine/Abraxane today. We discussed the case with her daughter by telephone.  Julieanne Manson, MD

## 2019-02-03 NOTE — Telephone Encounter (Signed)
Scheduled appt per 5/15 los.  Left a detailed message of the appt and my call back number.

## 2019-02-07 ENCOUNTER — Telehealth: Payer: Self-pay | Admitting: *Deleted

## 2019-02-07 NOTE — Telephone Encounter (Signed)
Called to inquire about when next WebEx visit will occur and asking how many treatments she will have and when will she start having more energy? Also reports a persistent cough that tends to be worse at night. No fever. Provided appt. For 02/10/19 at 11am for WebEx and suggested she speak with MD about total # treatments at that time. Suggested OTC cough preparation for her cough. Informed daughter, her fatigue may not improve until after all the treatments are done and even this will take time. Reminded her she is getting a strong chemo and she is 85 years old--it is going to be hard on her.

## 2019-02-09 ENCOUNTER — Telehealth: Payer: Self-pay | Admitting: Oncology

## 2019-02-09 NOTE — Telephone Encounter (Signed)
Called patient regarding upcoming Webex appointment, patient's daughter will help set up Webex. E-mail has been sent.

## 2019-02-10 ENCOUNTER — Inpatient Hospital Stay (HOSPITAL_BASED_OUTPATIENT_CLINIC_OR_DEPARTMENT_OTHER): Payer: Medicare Other | Admitting: Oncology

## 2019-02-10 DIAGNOSIS — C251 Malignant neoplasm of body of pancreas: Secondary | ICD-10-CM

## 2019-02-10 NOTE — Progress Notes (Signed)
  [facility] OFFICE VISIT PROGRESS NOTE  I connected with Tiffany [name] on 02/10/19 at 11:15 AM EDT by video and verified that I am speaking with the correct person using two identifiers.    Other persons participating in the visit and their role in the encounter: Daughter  Patient's location: Home Provider's location: Office visit   Diagnosis: Pancreas cancer  INTERVAL HISTORY:   Tiffany [name] completed another cycle of gemcitabine/Abraxane on 02/03/2019.  No fever.  No abdominal pain.  No nausea.  She continues to have a poor appetite despite taking prednisone.  She took Tylenol at night for "restless legs ".  She had one episode of diarrhea following chemotherapy.  She reports malaise.   Lab Results:  Lab Results  Component Value Date   WBC 6.2 02/03/2019   HGB 12.4 02/03/2019   HCT 37.1 02/03/2019   MCV 89.4 02/03/2019   PLT 269 02/03/2019   NEUTROABS 4.3 02/03/2019    Medications: I have reviewed the patient's current medications.  Assessment/Plan: 1. Pancreas mass/lung nodules/peritoneal nodules  Abdominal ultrasound 11/25/2018-1.0 cm pancreatic head cyst and a 5.5 mm lymph node adjacent to the pancreatic head.   MRI abdomen 11/25/2018-irregular poorly marginated hypoenhancing 3.2 x 2.2 cm posterior pancreatic body mass encasing the celiac artery; separate 1.4 cm pancreatic neck and 1.2 cm posterior pancreatic head cystic masses; multiple lung masses/pulmonary nodules scattered throughout both lung bases measuring up to 3.5 cm; extensive peritoneal carcinomatosis with soft tissue metastases throughout the omentum and perihepatic space; trace ascites.  CTs 12/06/2018-large left thyroid nodule, bilateral pulmonary nodules, pancreas body/tail mass with involvement of the celiac axis, peritoneal nodularity, omental caking at the anterior abdominal wall  CT biopsy of an omental nodule 12/12/2018-metastatic adenocarcinoma consistent  with pancreas cancer  Cycle 1 gemcitabine/Abraxane 12/22/2018  Cycle 2 gemcitabine/Abraxane 01/05/2019  Common hereditary cancer panel-negative  Cycle3gemcitabine/Abraxane 01/20/2019  Cycle 4 gemcitabine/Abraxane 02/03/2019 2. Abdominal pain secondary to #1-improved 3. Hypertension 4. Hypertrophic obstructive cardiomyopathy 5. Cough-likely secondary to pulmonary metastases 6. Hypercalcemia 12/22/2018 status post Zometa 7. Anorexia secondary to #1 8. Multifocal acute pulmonary emboli diagnosedon CT 01/23/2019- treated with heparin and discharged on Eliquis   Disposition: Tiffany [name] has completed 4 treatments with gemcitabine/Abraxane.  She continues to have a poor performance status.  She appears to be tolerating the chemotherapy well, but her appetite and energy level have not improved.  However her pain is better.  I discussed treatment options with Ms. Benito and her daughter.  I discussed hospice care.  She would like to continue chemotherapy until she has another restaging CT.  She will return for office visit and cycle 5 chemotherapy in 1 week.  We will plan for a restaging CT evaluation after cycle 5 for cycle 6.   I discussed the assessment and treatment plan with the patient. The patient was provided an opportunity to ask questions and all were answered. The patient agreed with the plan and demonstrated an understanding of the instructions.   The patient was advised to call back or seek an in-person evaluation if the symptoms worsen or if the condition fails to improve as anticipated.  I provided 15 minutes of video and documentation time during this encounter, and > 50% was spent counseling as documented under my assessment & plan.  Betsy Coder ANP/GNP-BC   02/10/2019 11:20 AM

## 2019-02-13 ENCOUNTER — Other Ambulatory Visit: Payer: Self-pay | Admitting: Oncology

## 2019-02-14 ENCOUNTER — Telehealth: Payer: Self-pay | Admitting: Oncology

## 2019-02-14 NOTE — Telephone Encounter (Signed)
Per 5/22 los F/u 5/29 as scheduled

## 2019-02-17 ENCOUNTER — Inpatient Hospital Stay: Payer: Medicare Other

## 2019-02-17 ENCOUNTER — Other Ambulatory Visit: Payer: Self-pay

## 2019-02-17 ENCOUNTER — Inpatient Hospital Stay (HOSPITAL_BASED_OUTPATIENT_CLINIC_OR_DEPARTMENT_OTHER): Payer: Medicare Other | Admitting: Nurse Practitioner

## 2019-02-17 ENCOUNTER — Encounter: Payer: Self-pay | Admitting: Nurse Practitioner

## 2019-02-17 VITALS — BP 102/69 | HR 99 | Temp 98.5°F | Resp 18 | Ht 66.0 in | Wt 117.6 lb

## 2019-02-17 DIAGNOSIS — R05 Cough: Secondary | ICD-10-CM

## 2019-02-17 DIAGNOSIS — R918 Other nonspecific abnormal finding of lung field: Secondary | ICD-10-CM | POA: Diagnosis not present

## 2019-02-17 DIAGNOSIS — Z79899 Other long term (current) drug therapy: Secondary | ICD-10-CM | POA: Diagnosis not present

## 2019-02-17 DIAGNOSIS — G893 Neoplasm related pain (acute) (chronic): Secondary | ICD-10-CM | POA: Diagnosis not present

## 2019-02-17 DIAGNOSIS — C251 Malignant neoplasm of body of pancreas: Secondary | ICD-10-CM

## 2019-02-17 DIAGNOSIS — C258 Malignant neoplasm of overlapping sites of pancreas: Secondary | ICD-10-CM | POA: Diagnosis not present

## 2019-02-17 DIAGNOSIS — Z5111 Encounter for antineoplastic chemotherapy: Secondary | ICD-10-CM | POA: Diagnosis not present

## 2019-02-17 DIAGNOSIS — C786 Secondary malignant neoplasm of retroperitoneum and peritoneum: Secondary | ICD-10-CM

## 2019-02-17 DIAGNOSIS — E041 Nontoxic single thyroid nodule: Secondary | ICD-10-CM

## 2019-02-17 DIAGNOSIS — Z95828 Presence of other vascular implants and grafts: Secondary | ICD-10-CM

## 2019-02-17 LAB — CBC WITH DIFFERENTIAL (CANCER CENTER ONLY)
Abs Immature Granulocytes: 0.05 10*3/uL (ref 0.00–0.07)
Basophils Absolute: 0 10*3/uL (ref 0.0–0.1)
Basophils Relative: 0 %
Eosinophils Absolute: 0.2 10*3/uL (ref 0.0–0.5)
Eosinophils Relative: 2 %
HCT: 36.3 % (ref 36.0–46.0)
Hemoglobin: 11.9 g/dL — ABNORMAL LOW (ref 12.0–15.0)
Immature Granulocytes: 1 %
Lymphocytes Relative: 14 %
Lymphs Abs: 1 10*3/uL (ref 0.7–4.0)
MCH: 30.8 pg (ref 26.0–34.0)
MCHC: 32.8 g/dL (ref 30.0–36.0)
MCV: 94 fL (ref 80.0–100.0)
Monocytes Absolute: 1 10*3/uL (ref 0.1–1.0)
Monocytes Relative: 14 %
Neutro Abs: 4.6 10*3/uL (ref 1.7–7.7)
Neutrophils Relative %: 69 %
Platelet Count: 227 10*3/uL (ref 150–400)
RBC: 3.86 MIL/uL — ABNORMAL LOW (ref 3.87–5.11)
RDW: 21.9 % — ABNORMAL HIGH (ref 11.5–15.5)
WBC Count: 6.8 10*3/uL (ref 4.0–10.5)
nRBC: 0 % (ref 0.0–0.2)

## 2019-02-17 LAB — CMP (CANCER CENTER ONLY)
ALT: 12 U/L (ref 0–44)
AST: 14 U/L — ABNORMAL LOW (ref 15–41)
Albumin: 2.7 g/dL — ABNORMAL LOW (ref 3.5–5.0)
Alkaline Phosphatase: 66 U/L (ref 38–126)
Anion gap: 5 (ref 5–15)
BUN: 19 mg/dL (ref 8–23)
CO2: 28 mmol/L (ref 22–32)
Calcium: 9.4 mg/dL (ref 8.9–10.3)
Chloride: 106 mmol/L (ref 98–111)
Creatinine: 0.72 mg/dL (ref 0.44–1.00)
GFR, Est AFR Am: >60 mL/min (ref >60)
GFR, Estimated: >60 mL/min (ref >60)
Glucose, Bld: 142 mg/dL — ABNORMAL HIGH (ref 70–99)
Potassium: 3.7 mmol/L (ref 3.5–5.1)
Sodium: 139 mmol/L (ref 135–145)
Total Bilirubin: 0.5 mg/dL (ref 0.3–1.2)
Total Protein: 5.2 g/dL — ABNORMAL LOW (ref 6.5–8.1)

## 2019-02-17 MED ORDER — HEPARIN SOD (PORK) LOCK FLUSH 100 UNIT/ML IV SOLN
500.0000 [IU] | Freq: Once | INTRAVENOUS | Status: AC | PRN
  Administered 2019-02-17: 500 [IU]
  Filled 2019-02-17: qty 5

## 2019-02-17 MED ORDER — SODIUM CHLORIDE 0.9 % IV SOLN
800.0000 mg/m2 | Freq: Once | INTRAVENOUS | Status: AC
  Administered 2019-02-17: 1254 mg via INTRAVENOUS
  Filled 2019-02-17: qty 32.98

## 2019-02-17 MED ORDER — SODIUM CHLORIDE 0.9% FLUSH
10.0000 mL | INTRAVENOUS | Status: DC | PRN
  Administered 2019-02-17: 10 mL
  Filled 2019-02-17: qty 10

## 2019-02-17 MED ORDER — SODIUM CHLORIDE 0.9 % IV SOLN
Freq: Once | INTRAVENOUS | Status: AC
  Administered 2019-02-17: 16:00:00 via INTRAVENOUS
  Filled 2019-02-17: qty 250

## 2019-02-17 MED ORDER — PROCHLORPERAZINE MALEATE 10 MG PO TABS
ORAL_TABLET | ORAL | Status: AC
  Filled 2019-02-17: qty 1

## 2019-02-17 MED ORDER — PROCHLORPERAZINE MALEATE 10 MG PO TABS
5.0000 mg | ORAL_TABLET | Freq: Once | ORAL | Status: AC
  Administered 2019-02-17: 16:00:00 5 mg via ORAL

## 2019-02-17 MED ORDER — PACLITAXEL PROTEIN-BOUND CHEMO INJECTION 100 MG
100.0000 mg/m2 | Freq: Once | INTRAVENOUS | Status: AC
  Administered 2019-02-17: 150 mg via INTRAVENOUS
  Filled 2019-02-17: qty 30

## 2019-02-17 MED ORDER — SODIUM CHLORIDE 0.9% FLUSH
10.0000 mL | INTRAVENOUS | Status: DC | PRN
  Administered 2019-02-17: 18:00:00 10 mL
  Filled 2019-02-17: qty 10

## 2019-02-17 NOTE — Patient Instructions (Signed)
Coronavirus (COVID-19) Are you at risk?  Are you at risk for the Coronavirus (COVID-19)?  To be considered HIGH RISK for Coronavirus (COVID-19), you have to meet the following criteria:  . Traveled to [place], [place], [place], [place] or [place]; or in the [place] to [place], [place], [place], or [place]; and have fever, cough, and shortness of breath within the last 2 weeks of travel OR . Been in close contact with a person diagnosed with COVID-19 within the last 2 weeks and have fever, cough, and shortness of breath . IF YOU DO NOT MEET THESE CRITERIA, YOU ARE CONSIDERED LOW RISK FOR COVID-19.  What to do if you are HIGH RISK for COVID-19?  . If you are having a medical emergency, call 911. . Seek medical care right away. Before you go to a doctor's office, urgent care or emergency department, call ahead and tell them about your recent travel, contact with someone diagnosed with COVID-19, and your symptoms. You should receive instructions from your physician's office regarding next steps of care.  . When you arrive at healthcare provider, tell the healthcare staff immediately you have returned from visiting [place], [place], [place], [place] or [place]; or traveled in the [place] to [place], [place], [place], or [place]; in the last two weeks or you have been in close contact with a person diagnosed with COVID-19 in the last 2 weeks.   . Tell the health care staff about your symptoms: fever, cough and shortness of breath. . After you have been seen by a medical provider, you will be either: o Tested for (COVID-19) and discharged home on quarantine except to seek medical care if symptoms worsen, and asked to  - Stay home and avoid contact with others until you get your results (4-5 days)  - Avoid travel on public transportation if possible (such as bus, train, or airplane) or o Sent to the Emergency Department by EMS for evaluation, COVID-19 testing, and possible  admission depending on your condition and test results.  What to do if you are LOW RISK for COVID-19?  Reduce your risk of any infection by using the same precautions used for avoiding the common cold or flu:  . Wash your hands often with soap and warm water for at least 20 seconds.  If soap and water are not readily available, use an alcohol-based hand sanitizer with at least 60% alcohol.  . If coughing or sneezing, cover your mouth and nose by coughing or sneezing into the elbow areas of your shirt or coat, into a tissue or into your sleeve (not your hands). . Avoid shaking hands with others and consider head nods or verbal greetings only. . Avoid touching your eyes, nose, or mouth with unwashed hands.  . Avoid close contact with people who are sick. . Avoid places or events with large numbers of people in one location, like concerts or sporting events. . Carefully consider travel plans you have or are making. . If you are planning any travel outside or inside the [place], visit the CDC's Travelers' Health webpage for the latest health notices. . If you have some symptoms but not all symptoms, continue to monitor at home and seek medical attention if your symptoms worsen. . If you are having a medical emergency, call 911.   ADDITIONAL HEALTHCARE OPTIONS FOR PATIENTS  [facility] / e-Visit: [email or web address]         [facility]: [phone]  Onset   Urgent Care: [facility]: [facility] Discharge Instructions for Patients Receiving Chemotherapy  Today you received the following chemotherapy agents:Paclitaxel protein-bound (Abraxane) and Gemcitabine (Gemzar)  To help prevent nausea and vomiting after your treatment, we encourage you to take your nausea medication as directed.    If you develop nausea and vomiting that is not controlled by your  nausea medication, call the clinic.   BELOW ARE SYMPTOMS THAT SHOULD BE REPORTED IMMEDIATELY:  *FEVER GREATER THAN 100.5 F  *CHILLS WITH OR WITHOUT FEVER  NAUSEA AND VOMITING THAT IS NOT CONTROLLED WITH YOUR NAUSEA MEDICATION  *UNUSUAL SHORTNESS OF BREATH  *UNUSUAL BRUISING OR BLEEDING  TENDERNESS IN MOUTH AND THROAT WITH OR WITHOUT PRESENCE OF ULCERS  *URINARY PROBLEMS  *BOWEL PROBLEMS  UNUSUAL RASH Items with * indicate a potential emergency and should be followed up as soon as possible.  Feel free to call the clinic should you have any questions or concerns. The clinic phone number is [phone].  Please show the [facility] at check-in to the Emergency Department and triage nurse.

## 2019-02-17 NOTE — Progress Notes (Addendum)
  [facility] OFFICE PROGRESS NOTE   Diagnosis: Pancreas cancer  INTERVAL HISTORY:   Tiffany [name] returns as scheduled.  She completed cycle 4 gemcitabine/Abraxane on 02/03/2019.  She denies nausea/vomiting.  No mouth sores.  No diarrhea.  No rash.  She denies fever.  No shortness of breath or cough.  Appetite varies.  She denies abdominal pain.  Objective:  Vital signs in last 24 hours:  Blood pressure 102/69, pulse 99, temperature 98.5 F (36.9 C), temperature source Oral, resp. rate 18, height 5\' 6"  (1.676 m), weight 117 lb 9.6 oz (53.3 kg), SpO2 99 %.    GI: No hepatomegaly. Vascular: No leg edema. Skin: No rash. Port-A-Cath without erythema.   Lab Results:  Lab Results  Component Value Date   WBC 6.8 02/17/2019   HGB 11.9 (L) 02/17/2019   HCT 36.3 02/17/2019   MCV 94.0 02/17/2019   PLT 227 02/17/2019   NEUTROABS 4.6 02/17/2019    Imaging:  No results found.  Medications: I have reviewed the patient's current medications.  Assessment/Plan: 1. Pancreas mass/lung nodules/peritoneal nodules  Abdominal ultrasound 11/25/2018-1.0 cm pancreatic head cyst and a 5.5 mm lymph node adjacent to the pancreatic head.   MRI abdomen 11/25/2018-irregular poorly marginated hypoenhancing 3.2 x 2.2 cm posterior pancreatic body mass encasing the celiac artery; separate 1.4 cm pancreatic neck and 1.2 cm posterior pancreatic head cystic masses; multiple lung masses/pulmonary nodules scattered throughout both lung bases measuring up to 3.5 cm; extensive peritoneal carcinomatosis with soft tissue metastases throughout the omentum and perihepatic space; trace ascites.  CTs 12/06/2018-large left thyroid nodule, bilateral pulmonary nodules, pancreas body/tail mass with involvement of the celiac axis, peritoneal nodularity, omental caking at the anterior abdominal wall  CT biopsy of an omental nodule 12/12/2018-metastatic adenocarcinoma consistent with pancreas cancer  Cycle 1  gemcitabine/Abraxane 12/22/2018  Cycle 2 gemcitabine/Abraxane 01/05/2019  Common hereditary cancer panel-negative  Cycle3gemcitabine/Abraxane 01/20/2019  Cycle 4 gemcitabine/Abraxane 02/03/2019  Cycle 5 gemcitabine/Abraxane 02/17/2019 2. Abdominal pain secondary to #1-improved 3. Hypertension 4. Hypertrophic obstructive cardiomyopathy 5. Cough-likely secondary to pulmonary metastases 6. Hypercalcemia 12/22/2018 status post Zometa 7. Anorexia secondary to #1 8. Multifocal acute pulmonary emboli diagnosedon CT 01/23/2019-treated with heparin and discharged on Eliquis   Disposition: Tiffany [name] appears unchanged.  She has completed 4 cycles of gemcitabine/Abraxane.  Abdominal pain continues to be improved.  Plan to proceed with cycle 5 today as scheduled.  Doses will be adjusted based on weight loss.  She will undergo restaging CTs in approximately 2 weeks.  We reviewed the CBC from today.  Counts are adequate for treatment.  We will make arrangements for a virtual visit in 1 week.  She will return for an office visit in 2 weeks.  She will contact the office in the interim with any problems.  Patient seen with Dr. Benay Spice.    Tiffany [name] ANP/GNP-BC   02/17/2019  3:17 PM  This was a shared visit with Tiffany [name].  Tiffany [name] would like to continue chemotherapy.  She will undergo a restaging CT evaluation after this cycle.  We discussed the prognosis and recommendation for comfort care if the CTs confirmed disease progression. The chemotherapy doses were adjusted to her current weight. Tiffany Manson, MD

## 2019-02-20 ENCOUNTER — Telehealth: Payer: Self-pay | Admitting: Nurse Practitioner

## 2019-02-20 ENCOUNTER — Other Ambulatory Visit: Payer: Self-pay | Admitting: *Deleted

## 2019-02-20 ENCOUNTER — Telehealth: Payer: Self-pay | Admitting: *Deleted

## 2019-02-20 DIAGNOSIS — C251 Malignant neoplasm of body of pancreas: Secondary | ICD-10-CM

## 2019-02-20 MED ORDER — APIXABAN 5 MG PO TABS: 5.0000 mg | ORAL_TABLET | Freq: Two times a day (BID) | ORAL | 1 refills | Status: DC

## 2019-02-20 NOTE — Telephone Encounter (Signed)
She should continue the Eliquis, please refill

## 2019-02-20 NOTE — Telephone Encounter (Signed)
Scheduled appt per 5/29 los.  Spoke with patient and patient aware of appt date and time.

## 2019-02-20 NOTE — Telephone Encounter (Signed)
Called daughter [name] and left message on voice mail that pt should continue taking Eliquis as per Dr. Benay Spice. Refill sent to pt's pharmacy as per daughter's request.

## 2019-02-20 NOTE — Telephone Encounter (Signed)
Daughter [name] called wanted to know if pt should continue taking Eliquis.  Pt has no refills. Spoke with [name], and was informed that pt uses [facility] on [address].  [name] wanted to know if pt should wait for CT scan results - scheduled for Fri 02/24/19 - before pt would continue taking Eliquis. [facility]    Phone     [phone].

## 2019-02-21 ENCOUNTER — Other Ambulatory Visit: Payer: Self-pay | Admitting: Pharmacist

## 2019-02-22 ENCOUNTER — Telehealth: Payer: Self-pay | Admitting: Oncology

## 2019-02-22 NOTE — Telephone Encounter (Signed)
Called patient regarding upcoming [facility] appointment, patient's daughter is aware and will be sent an e-mail.

## 2019-02-24 ENCOUNTER — Other Ambulatory Visit: Payer: Self-pay | Admitting: *Deleted

## 2019-02-24 ENCOUNTER — Telehealth: Payer: Self-pay | Admitting: Oncology

## 2019-02-24 ENCOUNTER — Other Ambulatory Visit: Payer: Self-pay

## 2019-02-24 ENCOUNTER — Encounter: Payer: Self-pay | Admitting: *Deleted

## 2019-02-24 ENCOUNTER — Inpatient Hospital Stay: Payer: Medicare Other | Attending: Oncology | Admitting: Oncology

## 2019-02-24 ENCOUNTER — Ambulatory Visit (HOSPITAL_COMMUNITY)
Admission: RE | Admit: 2019-02-24 | Discharge: 2019-02-24 | Disposition: A | Discharge status: Home / Self Care | Payer: Medicare Other | Source: Ambulatory Visit | Attending: Nurse Practitioner | Admitting: Nurse Practitioner

## 2019-02-24 DIAGNOSIS — C251 Malignant neoplasm of body of pancreas: Secondary | ICD-10-CM | POA: Diagnosis not present

## 2019-02-24 DIAGNOSIS — C250 Malignant neoplasm of head of pancreas: Secondary | ICD-10-CM

## 2019-02-24 DIAGNOSIS — C7989 Secondary malignant neoplasm of other specified sites: Secondary | ICD-10-CM | POA: Diagnosis not present

## 2019-02-24 DIAGNOSIS — C7800 Secondary malignant neoplasm of unspecified lung: Secondary | ICD-10-CM | POA: Diagnosis not present

## 2019-02-24 DIAGNOSIS — I2699 Other pulmonary embolism without acute cor pulmonale: Secondary | ICD-10-CM | POA: Insufficient documentation

## 2019-02-24 DIAGNOSIS — C252 Malignant neoplasm of tail of pancreas: Secondary | ICD-10-CM | POA: Insufficient documentation

## 2019-02-24 DIAGNOSIS — R630 Anorexia: Secondary | ICD-10-CM | POA: Diagnosis not present

## 2019-02-24 DIAGNOSIS — C786 Secondary malignant neoplasm of retroperitoneum and peritoneum: Secondary | ICD-10-CM | POA: Insufficient documentation

## 2019-02-24 DIAGNOSIS — C7801 Secondary malignant neoplasm of right lung: Secondary | ICD-10-CM | POA: Diagnosis not present

## 2019-02-24 DIAGNOSIS — Z7901 Long term (current) use of anticoagulants: Secondary | ICD-10-CM | POA: Insufficient documentation

## 2019-02-24 DIAGNOSIS — G893 Neoplasm related pain (acute) (chronic): Secondary | ICD-10-CM | POA: Insufficient documentation

## 2019-02-24 MED ORDER — IOHEXOL 300 MG/ML  SOLN
100.0000 mL | Freq: Once | INTRAMUSCULAR | Status: AC | PRN
  Administered 2019-02-24: 100 mL via INTRAVENOUS

## 2019-02-24 MED ORDER — SODIUM CHLORIDE (PF) 0.9 % IJ SOLN
INTRAMUSCULAR | Status: AC
  Filled 2019-02-24: qty 50

## 2019-02-24 NOTE — Telephone Encounter (Signed)
Follow-up Riko 12, 2020 as scheduled per 6/5 los.

## 2019-02-24 NOTE — Progress Notes (Signed)
[facility] OFFICE VISIT PROGRESS NOTE  I connected with@ on 02/24/19 at 12:44 PM EDT by video and verified that I am speaking with the correct person using two identifiers.   I discussed the limitations, risks, security and privacy concerns of performing an evaluation and management service by telemedicine and the availability of in-person appointments. I also discussed with the patient that there may be a patient responsible charge related to this service. The patient expressed understanding and agreed to proceed.  Other persons participating in the visit and their role in the encounter: Daughter  Patient's location: Home Provider's location: Office   Diagnosis: Pancreas cancer  INTERVAL HISTORY:   Tiffany [name] was seen today for a video visit.  This was in lieu of an in person visit secondary to the [place] pandemic.  Tiffany [name] continues to feel weak.  She stays on the sofa most of the time.  She has anorexia.  No pain.  No shortness of breath.  She had diarrhea after chemotherapy last week.  This has resolved.      Lab Results  Component Value Date   WBC 6.8 02/17/2019   HGB 11.9 (L) 02/17/2019   HCT 36.3 02/17/2019   MCV 94.0 02/17/2019   PLT 227 02/17/2019   NEUTROABS 4.6 02/17/2019    Imaging:  Ct Chest W Contrast  Result Date: 02/24/2019 CLINICAL DATA:  Pancreas cancer.  Restaging EXAM: CT CHEST, ABDOMEN, AND PELVIS WITH CONTRAST TECHNIQUE: Multidetector CT imaging of the chest, abdomen and pelvis was performed following the standard protocol during bolus administration of intravenous contrast. CONTRAST:  161mL OMNIPAQUE IOHEXOL 300 MG/ML  SOLN COMPARISON:  12/12/2018 FINDINGS: CT CHEST FINDINGS Cardiovascular: The heart size appears normal. There is a new pericardial effusion which measures 1 cm in thickness. Aortic atherosclerosis. Left main, lad and RCA coronary artery calcifications noted. Mediastinum/Nodes: Thyroid  goiter identified with dominant nodule arising from inferior pole of left lobe of thyroid gland extending into COPD. Mediastinum. This nodule measures 8.4 cm in maximum dimension. Mass effect upon the left lateral wall of trachea noted. Unremarkable appearance of the esophagus. No axillary adenopathy. No supraclavicular, mediastinal or hilar adenopathy. Lungs/Pleura: Bilateral pleural effusions are new from previous exam, right greater than left. Multifocal pulmonary metastases are again identified. Posterolateral left upper lobe lung nodule measures 1.8 cm, image 56/7. Unchanged. Posterolateral nodule within the right upper lobe measures 3.6 cm, image 71/7. Previously 2.2 cm. New subpleural nodule within the anterior right base measures 2.4 cm, image 121/7. Subpleural nodule within the lateral left lower lobe measures 2.4 cm, image 121/7. Previously 3.6 cm. Musculoskeletal: The bones appear diffusely osteopenic. No aggressive lytic or sclerotic bone lesions. CT ABDOMEN PELVIS FINDINGS Hepatobiliary: No suspicious liver lesion. Gallbladder negative. No biliary ductal dilatation. Pancreas: Mass along the posterior body and tail of pancreas measures 2.9 cm, image 30/2. Previously 3.3 cm. Associated involvement of the celiac axis identified. The tumor also extends and partially involves the portal venous confluence, image 30/11. Unchanged cystic lesion within head of pancreas measuring 1.1 cm, image 36/2. Spleen: Normal in size without focal abnormality. Adrenals/Urinary Tract: Stable 1.8 cm right adrenal adenoma. Normal left adrenal gland. No kidney mass or hydronephrosis. Stomach/Bowel: Stomach is normal. The small bowel loops have a normal course and caliber. The appendix is visualized and appears normal. No pathologic dilatation of the colon. Distal colonic diverticulosis noted. Vascular/Lymphatic: Aortic atherosclerosis. No aneurysm. Tumor involvement of the portal venous confluence and celiac axis  noted. 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 No abdominopelvic adenopathy. Reproductive: Status post hysterectomy. No adnexal masses. Other: There is moderate volume of ascites which is similar to previous exam. Extensive peritoneal nodularity with omental caking is again identified. Left abdominal omental nodule measures 4.5 by 1.5 cm, image 120/5. Previously 2.5 by 1.6 cm. Within the left posterior pelvis there is a 2.2 cm peritoneal nodule, image 175/5. Previously 1.5 cm. Nodule along the anterior surface of the rectum measures 1.5 cm, image 177/5. Previously this measured 0.6 cm. Musculoskeletal: No acute or significant osseous findings. IMPRESSION: 1. Mass involving the pancreatic body/tail is again noted and is stable to mildly decreased in size from previous exam. 2. There is extensive peritoneal metastases with omental caking and ascites. Mildly increased from previous exam. 3. Bilateral pulmonary nodules compatible with metastatic disease. Some of these are stable or improved in the interval. Within the posterolateral right upper lobe there is an enlarging subpleural metastasis. 4. New bilateral pleural effusions and new pericardial effusion. 5. Similar thyroid goiter. 6. Aortic Atherosclerosis (ICD10-I70.0). Coronary artery calcifications. Electronically Signed   By: Kerby Moors M.D.   On: 02/24/2019 11:11   Ct Abdomen Pelvis W Contrast  Result Date: 02/24/2019 CLINICAL DATA:  Pancreas cancer.  Restaging EXAM: CT CHEST, ABDOMEN, AND PELVIS WITH CONTRAST TECHNIQUE: Multidetector CT imaging of the chest, abdomen and pelvis was performed following the standard protocol during bolus administration of intravenous contrast. CONTRAST:  169mL OMNIPAQUE IOHEXOL 300 MG/ML  SOLN COMPARISON:  12/12/2018 FINDINGS: CT CHEST FINDINGS Cardiovascular: The heart size appears normal. There is a new pericardial effusion which measures 1 cm in thickness. Aortic atherosclerosis. Left main, lad and RCA coronary  artery calcifications noted. Mediastinum/Nodes: Thyroid goiter identified with dominant nodule arising from inferior pole of left lobe of thyroid gland extending into COPD. Mediastinum. This nodule measures 8.4 cm in maximum dimension. Mass effect upon the left lateral wall of trachea noted. Unremarkable appearance of the esophagus. No axillary adenopathy. No supraclavicular, mediastinal or hilar adenopathy. Lungs/Pleura: Bilateral pleural effusions are new from previous exam, right greater than left. Multifocal pulmonary metastases are again identified. Posterolateral left upper lobe lung nodule measures 1.8 cm, image 56/7. Unchanged. Posterolateral nodule within the right upper lobe measures 3.6 cm, image 71/7. Previously 2.2 cm. New subpleural nodule within the anterior right base measures 2.4 cm, image 121/7. Subpleural nodule within the lateral left lower lobe measures 2.4 cm, image 121/7. Previously 3.6 cm. Musculoskeletal: The bones appear diffusely osteopenic. No aggressive lytic or sclerotic bone lesions. CT ABDOMEN PELVIS FINDINGS Hepatobiliary: No suspicious liver lesion. Gallbladder negative. No biliary ductal dilatation. Pancreas: Mass along the posterior body and tail of pancreas measures 2.9 cm, image 30/2. Previously 3.3 cm. Associated involvement of the celiac axis identified. The tumor also extends and partially involves the portal venous confluence, image 30/11. Unchanged cystic lesion within head of pancreas measuring 1.1 cm, image 36/2. Spleen: Normal in size without focal abnormality. Adrenals/Urinary Tract: Stable 1.8 cm right adrenal adenoma. Normal left adrenal gland. No kidney mass or hydronephrosis. Stomach/Bowel: Stomach is normal. The small bowel loops have a normal course and caliber. The appendix is visualized and appears normal. No pathologic dilatation of the colon. Distal colonic diverticulosis noted. Vascular/Lymphatic: Aortic atherosclerosis. No aneurysm. Tumor involvement of the  portal venous confluence and celiac axis noted. 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 No abdominopelvic adenopathy. Reproductive: Status post hysterectomy. No adnexal masses. Other: There is moderate volume  of ascites which is similar to previous exam. Extensive peritoneal nodularity with omental caking is again identified. Left abdominal omental nodule measures 4.5 by 1.5 cm, image 120/5. Previously 2.5 by 1.6 cm. Within the left posterior pelvis there is a 2.2 cm peritoneal nodule, image 175/5. Previously 1.5 cm. Nodule along the anterior surface of the rectum measures 1.5 cm, image 177/5. Previously this measured 0.6 cm. Musculoskeletal: No acute or significant osseous findings. IMPRESSION: 1. Mass involving the pancreatic body/tail is again noted and is stable to mildly decreased in size from previous exam. 2. There is extensive peritoneal metastases with omental caking and ascites. Mildly increased from previous exam. 3. Bilateral pulmonary nodules compatible with metastatic disease. Some of these are stable or improved in the interval. Within the posterolateral right upper lobe there is an enlarging subpleural metastasis. 4. New bilateral pleural effusions and new pericardial effusion. 5. Similar thyroid goiter. 6. Aortic Atherosclerosis (ICD10-I70.0). Coronary artery calcifications. Electronically Signed   By: Kerby Moors M.D.   On: 02/24/2019 11:11    Medications: I have reviewed the patient's current medications.  Assessment/Plan: 1. Pancreas mass/lung nodules/peritoneal nodules  Abdominal ultrasound 11/25/2018-1.0 cm pancreatic head cyst and a 5.5 mm lymph node adjacent to the pancreatic head.   MRI abdomen 11/25/2018-irregular poorly marginated hypoenhancing 3.2 x 2.2 cm posterior pancreatic body mass encasing the celiac artery; separate 1.4 cm pancreatic neck and 1.2 cm posterior pancreatic head cystic masses; multiple lung masses/pulmonary nodules scattered throughout  both lung bases measuring up to 3.5 cm; extensive peritoneal carcinomatosis with soft tissue metastases throughout the omentum and perihepatic space; trace ascites.  CTs 12/06/2018-large left thyroid nodule, bilateral pulmonary nodules, pancreas body/tail mass with involvement of the celiac axis, peritoneal nodularity, omental caking at the anterior abdominal wall  CT biopsy of an omental nodule 12/12/2018-metastatic adenocarcinoma consistent with pancreas cancer  Cycle 1 gemcitabine/Abraxane 12/22/2018  Cycle 2 gemcitabine/Abraxane 01/05/2019  Common hereditary cancer panel-negative  Cycle3gemcitabine/Abraxane 01/20/2019  Cycle 4 gemcitabine/Abraxane 02/03/2019  Cycle 5 gemcitabine/Abraxane 02/17/2019  CT 02/24/2019- slight decrease in size of the pancreas mass, pleural/pericardial effusions, some lung nodules are larger, others are smaller, areas of omental caking appear improved whereas other areas of omental nodularity have increased 2. Abdominal pain secondary to #1-improved 3. Hypertension 4. Hypertrophic obstructive cardiomyopathy 5. Cough-likely secondary to pulmonary metastases 6. Hypercalcemia 12/22/2018 status post Zometa 7. Anorexia secondary to #1 8. Multifocal acute pulmonary emboli diagnosedon CT 01/23/2019-treated with heparin and discharged on Eliquis   Disposition: Tiffany [name] has metastatic pancreas cancer.  She continues to have a poor performance status despite chemotherapy.  I discussed the CT findings with Tiffany [name] and her daughter.  I reviewed the images myself and with a radiologist.  There appears to be a mixed response to therapy with the overall pattern being stable disease. She agrees to a consultation with the home hospice team.  Tiffany [name] will return for an office visit as scheduled next week.  The plan is to discontinue chemotherapy.     I discussed the assessment and treatment plan with the patient. The patient was provided an opportunity to ask questions  and all were answered. The patient agreed with the plan and demonstrated an understanding of the instructions.   The patient was advised to call back or seek an in-person evaluation if the symptoms worsen or if the condition fails to improve as anticipated.  I provided 30 minutes of video, chart review, and documentation time during this encounter, and > 50% was spent counseling  as documented under my assessment & plan.  Betsy Coder ANP/GNP-BC   02/24/2019 12:46 PM

## 2019-02-24 NOTE — Progress Notes (Signed)
Called [facility] with referral: Metastatic pancreas cancer, DNR and Dr. Benay Spice will be her attending. They will reach out to patient/daughter this afternoon.

## 2019-02-25 DIAGNOSIS — C786 Secondary malignant neoplasm of retroperitoneum and peritoneum: Secondary | ICD-10-CM | POA: Diagnosis not present

## 2019-02-25 DIAGNOSIS — C259 Malignant neoplasm of pancreas, unspecified: Secondary | ICD-10-CM | POA: Diagnosis not present

## 2019-02-25 DIAGNOSIS — E46 Unspecified protein-calorie malnutrition: Secondary | ICD-10-CM | POA: Diagnosis not present

## 2019-02-25 DIAGNOSIS — C7801 Secondary malignant neoplasm of right lung: Secondary | ICD-10-CM | POA: Diagnosis not present

## 2019-02-25 DIAGNOSIS — R634 Abnormal weight loss: Secondary | ICD-10-CM | POA: Diagnosis not present

## 2019-02-25 DIAGNOSIS — I10 Essential (primary) hypertension: Secondary | ICD-10-CM | POA: Diagnosis not present

## 2019-02-25 DIAGNOSIS — E785 Hyperlipidemia, unspecified: Secondary | ICD-10-CM | POA: Diagnosis not present

## 2019-02-25 DIAGNOSIS — Z681 Body mass index (BMI) 19 or less, adult: Secondary | ICD-10-CM | POA: Diagnosis not present

## 2019-02-25 DIAGNOSIS — M1990 Unspecified osteoarthritis, unspecified site: Secondary | ICD-10-CM | POA: Diagnosis not present

## 2019-02-25 DIAGNOSIS — C7802 Secondary malignant neoplasm of left lung: Secondary | ICD-10-CM | POA: Diagnosis not present

## 2019-02-25 DIAGNOSIS — Z741 Need for assistance with personal care: Secondary | ICD-10-CM | POA: Diagnosis not present

## 2019-02-25 DIAGNOSIS — Z86718 Personal history of other venous thrombosis and embolism: Secondary | ICD-10-CM | POA: Diagnosis not present

## 2019-02-26 ENCOUNTER — Other Ambulatory Visit: Payer: Self-pay | Admitting: Oncology

## 2019-02-27 ENCOUNTER — Ambulatory Visit: Payer: Medicare Other | Admitting: Oncology

## 2019-03-03 ENCOUNTER — Other Ambulatory Visit: Payer: Medicare Other

## 2019-03-03 ENCOUNTER — Ambulatory Visit: Payer: Medicare Other | Admitting: Nurse Practitioner

## 2019-03-03 ENCOUNTER — Inpatient Hospital Stay: Payer: Medicare Other

## 2019-03-03 ENCOUNTER — Other Ambulatory Visit: Payer: Self-pay

## 2019-03-03 ENCOUNTER — Inpatient Hospital Stay (HOSPITAL_BASED_OUTPATIENT_CLINIC_OR_DEPARTMENT_OTHER): Payer: Medicare Other | Admitting: Oncology

## 2019-03-03 ENCOUNTER — Ambulatory Visit: Payer: Medicare Other

## 2019-03-03 VITALS — BP 109/63 | HR 90 | Temp 98.5°F | Resp 18 | Ht 66.0 in | Wt 112.0 lb

## 2019-03-03 DIAGNOSIS — G893 Neoplasm related pain (acute) (chronic): Secondary | ICD-10-CM | POA: Diagnosis not present

## 2019-03-03 DIAGNOSIS — Z7901 Long term (current) use of anticoagulants: Secondary | ICD-10-CM

## 2019-03-03 DIAGNOSIS — C7800 Secondary malignant neoplasm of unspecified lung: Secondary | ICD-10-CM | POA: Diagnosis not present

## 2019-03-03 DIAGNOSIS — C252 Malignant neoplasm of tail of pancreas: Secondary | ICD-10-CM

## 2019-03-03 DIAGNOSIS — R630 Anorexia: Secondary | ICD-10-CM | POA: Diagnosis not present

## 2019-03-03 DIAGNOSIS — C251 Malignant neoplasm of body of pancreas: Secondary | ICD-10-CM

## 2019-03-03 DIAGNOSIS — Z95828 Presence of other vascular implants and grafts: Secondary | ICD-10-CM

## 2019-03-03 DIAGNOSIS — C250 Malignant neoplasm of head of pancreas: Secondary | ICD-10-CM | POA: Diagnosis not present

## 2019-03-03 DIAGNOSIS — I2699 Other pulmonary embolism without acute cor pulmonale: Secondary | ICD-10-CM | POA: Diagnosis not present

## 2019-03-03 DIAGNOSIS — C786 Secondary malignant neoplasm of retroperitoneum and peritoneum: Secondary | ICD-10-CM

## 2019-03-03 DIAGNOSIS — C7989 Secondary malignant neoplasm of other specified sites: Secondary | ICD-10-CM

## 2019-03-03 LAB — CMP (CANCER CENTER ONLY)
ALT: 12 U/L (ref 0–44)
AST: 13 U/L — ABNORMAL LOW (ref 15–41)
Albumin: 2.7 g/dL — ABNORMAL LOW (ref 3.5–5.0)
Alkaline Phosphatase: 69 U/L (ref 38–126)
Anion gap: 8 (ref 5–15)
BUN: 14 mg/dL (ref 8–23)
CO2: 26 mmol/L (ref 22–32)
Calcium: 9 mg/dL (ref 8.9–10.3)
Chloride: 102 mmol/L (ref 98–111)
Creatinine: 0.75 mg/dL (ref 0.44–1.00)
GFR, Est AFR Am: >60 mL/min (ref >60)
GFR, Estimated: >60 mL/min (ref >60)
Glucose, Bld: 153 mg/dL — ABNORMAL HIGH (ref 70–99)
Potassium: 3.9 mmol/L (ref 3.5–5.1)
Sodium: 136 mmol/L (ref 135–145)
Total Bilirubin: 0.6 mg/dL (ref 0.3–1.2)
Total Protein: 5.1 g/dL — ABNORMAL LOW (ref 6.5–8.1)

## 2019-03-03 LAB — CBC WITH DIFFERENTIAL (CANCER CENTER ONLY)
Abs Immature Granulocytes: 0.05 10*3/uL (ref 0.00–0.07)
Basophils Absolute: 0 10*3/uL (ref 0.0–0.1)
Basophils Relative: 1 %
Eosinophils Absolute: 0.1 10*3/uL (ref 0.0–0.5)
Eosinophils Relative: 2 %
HCT: 37.1 % (ref 36.0–46.0)
Hemoglobin: 12.1 g/dL (ref 12.0–15.0)
Immature Granulocytes: 1 %
Lymphocytes Relative: 21 %
Lymphs Abs: 1.1 10*3/uL (ref 0.7–4.0)
MCH: 31.1 pg (ref 26.0–34.0)
MCHC: 32.6 g/dL (ref 30.0–36.0)
MCV: 95.4 fL (ref 80.0–100.0)
Monocytes Absolute: 0.8 10*3/uL (ref 0.1–1.0)
Monocytes Relative: 16 %
Neutro Abs: 3 10*3/uL (ref 1.7–7.7)
Neutrophils Relative %: 59 %
Platelet Count: 275 10*3/uL (ref 150–400)
RBC: 3.89 MIL/uL (ref 3.87–5.11)
RDW: 21.3 % — ABNORMAL HIGH (ref 11.5–15.5)
WBC Count: 5.2 10*3/uL (ref 4.0–10.5)
nRBC: 0 % (ref 0.0–0.2)

## 2019-03-03 MED ORDER — SODIUM CHLORIDE 0.9% FLUSH
10.0000 mL | INTRAVENOUS | Status: DC | PRN
  Administered 2019-03-03: 11:00:00 10 mL via INTRAVENOUS
  Filled 2019-03-03: qty 10

## 2019-03-03 MED ORDER — SODIUM CHLORIDE 0.9% FLUSH
10.0000 mL | INTRAVENOUS | Status: DC | PRN
  Administered 2019-03-03: 10:00:00 10 mL
  Filled 2019-03-03: qty 10

## 2019-03-03 MED ORDER — HEPARIN SOD (PORK) LOCK FLUSH 100 UNIT/ML IV SOLN
500.0000 [IU] | Freq: Once | INTRAVENOUS | Status: AC
  Administered 2019-03-03: 11:00:00 500 [IU] via INTRAVENOUS
  Filled 2019-03-03: qty 5

## 2019-03-03 NOTE — Patient Instructions (Signed)

## 2019-03-03 NOTE — Progress Notes (Signed)
[place] OFFICE PROGRESS NOTE   Diagnosis: Pancreas cancer  INTERVAL HISTORY:   Tiffany [name] returns as scheduled.  She has enrolled in the [facility] hospice program.  She denies pain.  She stays on the sofa most of the time.  She has stable anorexia.  No dyspnea.  Objective:  Vital signs in last 24 hours:  Blood pressure 109/63, pulse 90, temperature 98.5 F (36.9 C), temperature source Oral, resp. rate 18, height 5\' 6"  (1.676 m), weight 112 lb (50.8 kg), SpO2 100 %.    Resp: Inspiratory rales at the left lower posterior chest, no respiratory distress Cardio: Regular rate and rhythm GI: Nontender, no mass, soft Vascular: No leg edema    Portacath/PICC-without erythema  Lab Results:  Lab Results  Component Value Date   WBC 5.2 03/03/2019   HGB 12.1 03/03/2019   HCT 37.1 03/03/2019   MCV 95.4 03/03/2019   PLT 275 03/03/2019   NEUTROABS 3.0 03/03/2019    CMP  Lab Results  Component Value Date   NA 136 03/03/2019   K 3.9 03/03/2019   CL 102 03/03/2019   CO2 26 03/03/2019   GLUCOSE 153 (H) 03/03/2019   BUN 14 03/03/2019   CREATININE 0.75 03/03/2019   CALCIUM 9.0 03/03/2019   PROT 5.1 (L) 03/03/2019   ALBUMIN 2.7 (L) 03/03/2019   AST 13 (L) 03/03/2019   ALT 12 03/03/2019   ALKPHOS 69 03/03/2019   BILITOT 0.6 03/03/2019   GFRNONAA >60 03/03/2019   GFRAA >60 03/03/2019    Medications: I have reviewed the patient's current medications.   Assessment/Plan: 1. Pancreas mass/lung nodules/peritoneal nodules  Abdominal ultrasound 11/25/2018-1.0 cm pancreatic head cyst and a 5.5 mm lymph node adjacent to the pancreatic head.   MRI abdomen 11/25/2018-irregular poorly marginated hypoenhancing 3.2 x 2.2 cm posterior pancreatic body mass encasing the celiac artery; separate 1.4 cm pancreatic neck and 1.2 cm posterior pancreatic head cystic masses; multiple lung masses/pulmonary nodules scattered throughout both lung bases measuring up to 3.5 cm; extensive  peritoneal carcinomatosis with soft tissue metastases throughout the omentum and perihepatic space; trace ascites.  CTs 12/06/2018-large left thyroid nodule, bilateral pulmonary nodules, pancreas body/tail mass with involvement of the celiac axis, peritoneal nodularity, omental caking at the anterior abdominal wall  CT biopsy of an omental nodule 12/12/2018-metastatic adenocarcinoma consistent with pancreas cancer  Cycle 1 gemcitabine/Abraxane 12/22/2018  Cycle 2 gemcitabine/Abraxane 01/05/2019  Common hereditary cancer panel-negative  Cycle3gemcitabine/Abraxane 01/20/2019  Cycle 4 gemcitabine/Abraxane 02/03/2019  Cycle 5 gemcitabine/Abraxane 02/17/2019  CT 02/24/2019- slight decrease in size of the pancreas mass, pleural/pericardial effusions, some lung nodules are larger, others are smaller, areas of omental caking appear improved whereas other areas of omental nodularity have increased 2. Abdominal pain secondary to #1-improved 3. Hypertension 4. Hypertrophic obstructive cardiomyopathy 5. Cough-likely secondary to pulmonary metastases 6. Hypercalcemia 12/22/2018 status post Zometa 7. Anorexia secondary to #1 8. Multifocal acute pulmonary emboli diagnosedon CT 01/23/2019-treated with heparin and discharged on Eliquis    Disposition: Tiffany [name] appears unchanged.  The restaging CT last week revealed a mixed response and overall stable disease.  Her performance status remains poor.  She did not feel well immediately following the last cycle of chemotherapy.  We decided to discontinue chemotherapy.  She has enrolled in hospice care. Her daughter was present for today's visit by telephone. Tiffany [name] will return for an office visit in 2 weeks.  She will contact us in the interim as needed.  Betsy Coder, MD  03/03/2019  11:17  AM

## 2019-03-06 ENCOUNTER — Telehealth: Payer: Self-pay | Admitting: Oncology

## 2019-03-06 NOTE — Telephone Encounter (Signed)
Per 6/12 los, cancelled lab and port appt.

## 2019-03-07 DIAGNOSIS — C7801 Secondary malignant neoplasm of right lung: Secondary | ICD-10-CM | POA: Diagnosis not present

## 2019-03-07 DIAGNOSIS — C786 Secondary malignant neoplasm of retroperitoneum and peritoneum: Secondary | ICD-10-CM | POA: Diagnosis not present

## 2019-03-07 DIAGNOSIS — C7802 Secondary malignant neoplasm of left lung: Secondary | ICD-10-CM | POA: Diagnosis not present

## 2019-03-07 DIAGNOSIS — E46 Unspecified protein-calorie malnutrition: Secondary | ICD-10-CM | POA: Diagnosis not present

## 2019-03-07 DIAGNOSIS — R634 Abnormal weight loss: Secondary | ICD-10-CM | POA: Diagnosis not present

## 2019-03-07 DIAGNOSIS — C259 Malignant neoplasm of pancreas, unspecified: Secondary | ICD-10-CM | POA: Diagnosis not present

## 2019-03-10 DIAGNOSIS — C786 Secondary malignant neoplasm of retroperitoneum and peritoneum: Secondary | ICD-10-CM | POA: Diagnosis not present

## 2019-03-10 DIAGNOSIS — C7802 Secondary malignant neoplasm of left lung: Secondary | ICD-10-CM | POA: Diagnosis not present

## 2019-03-10 DIAGNOSIS — C259 Malignant neoplasm of pancreas, unspecified: Secondary | ICD-10-CM | POA: Diagnosis not present

## 2019-03-10 DIAGNOSIS — E46 Unspecified protein-calorie malnutrition: Secondary | ICD-10-CM | POA: Diagnosis not present

## 2019-03-10 DIAGNOSIS — R634 Abnormal weight loss: Secondary | ICD-10-CM | POA: Diagnosis not present

## 2019-03-10 DIAGNOSIS — C7801 Secondary malignant neoplasm of right lung: Secondary | ICD-10-CM | POA: Diagnosis not present

## 2019-03-17 ENCOUNTER — Other Ambulatory Visit: Payer: Self-pay

## 2019-03-17 ENCOUNTER — Ambulatory Visit: Payer: Medicare Other

## 2019-03-17 ENCOUNTER — Inpatient Hospital Stay (HOSPITAL_BASED_OUTPATIENT_CLINIC_OR_DEPARTMENT_OTHER): Payer: Medicare Other | Admitting: Oncology

## 2019-03-17 ENCOUNTER — Other Ambulatory Visit: Payer: Medicare Other

## 2019-03-17 VITALS — BP 121/79 | HR 95 | Temp 99.1°F | Resp 18 | Ht 66.0 in | Wt 113.8 lb

## 2019-03-17 DIAGNOSIS — Z7901 Long term (current) use of anticoagulants: Secondary | ICD-10-CM | POA: Diagnosis not present

## 2019-03-17 DIAGNOSIS — I2699 Other pulmonary embolism without acute cor pulmonale: Secondary | ICD-10-CM

## 2019-03-17 DIAGNOSIS — C7800 Secondary malignant neoplasm of unspecified lung: Secondary | ICD-10-CM | POA: Diagnosis not present

## 2019-03-17 DIAGNOSIS — C7989 Secondary malignant neoplasm of other specified sites: Secondary | ICD-10-CM

## 2019-03-17 DIAGNOSIS — C786 Secondary malignant neoplasm of retroperitoneum and peritoneum: Secondary | ICD-10-CM

## 2019-03-17 DIAGNOSIS — C252 Malignant neoplasm of tail of pancreas: Secondary | ICD-10-CM | POA: Diagnosis not present

## 2019-03-17 DIAGNOSIS — C251 Malignant neoplasm of body of pancreas: Secondary | ICD-10-CM

## 2019-03-17 DIAGNOSIS — G893 Neoplasm related pain (acute) (chronic): Secondary | ICD-10-CM | POA: Diagnosis not present

## 2019-03-17 DIAGNOSIS — C250 Malignant neoplasm of head of pancreas: Secondary | ICD-10-CM

## 2019-03-17 DIAGNOSIS — R630 Anorexia: Secondary | ICD-10-CM

## 2019-03-17 NOTE — Progress Notes (Signed)
[place] OFFICE PROGRESS NOTE   Diagnosis: Pancreas cancer  INTERVAL HISTORY:   Ms. Eisenhuth returns for a scheduled visit.  Her daughter is present for today's visit by telephone.  Ms. Fern continues to have limited activity.  She stays on the sofa most of the time.  She has a good appetite.  No pain.  No dyspnea.  She has a cough. Ms. Garramone has enrolled in the [facility] hospice program.  Objective:  Vital signs in last 24 hours:  Blood pressure 121/79, pulse 95, temperature 99.1 F (37.3 C), resp. rate 18, height 5\' 6"  (1.676 m), weight 113 lb 12.8 oz (51.6 kg), SpO2 99 %.    Limited physical examination secondary to distancing with the COVID pandemic HEENT: No thrush  Portacath/PICC-without erythema  Lab Results:  Lab Results  Component Value Date   WBC 5.2 03/03/2019   HGB 12.1 03/03/2019   HCT 37.1 03/03/2019   MCV 95.4 03/03/2019   PLT 275 03/03/2019   NEUTROABS 3.0 03/03/2019    CMP  Lab Results  Component Value Date   NA 136 03/03/2019   K 3.9 03/03/2019   CL 102 03/03/2019   CO2 26 03/03/2019   GLUCOSE 153 (H) 03/03/2019   BUN 14 03/03/2019   CREATININE 0.75 03/03/2019   CALCIUM 9.0 03/03/2019   PROT 5.1 (L) 03/03/2019   ALBUMIN 2.7 (L) 03/03/2019   AST 13 (L) 03/03/2019   ALT 12 03/03/2019   ALKPHOS 69 03/03/2019   BILITOT 0.6 03/03/2019   GFRNONAA >60 03/03/2019   GFRAA >60 03/03/2019    Medications: I have reviewed the patient's current medications.   Assessment/Plan: 1. Pancreas mass/lung nodules/peritoneal nodules  Abdominal ultrasound 11/25/2018-1.0 cm pancreatic head cyst and a 5.5 mm lymph node adjacent to the pancreatic head.   MRI abdomen 11/25/2018-irregular poorly marginated hypoenhancing 3.2 x 2.2 cm posterior pancreatic body mass encasing the celiac artery; separate 1.4 cm pancreatic neck and 1.2 cm posterior pancreatic head cystic masses; multiple lung masses/pulmonary nodules scattered throughout both lung bases  measuring up to 3.5 cm; extensive peritoneal carcinomatosis with soft tissue metastases throughout the omentum and perihepatic space; trace ascites.  CTs 12/06/2018-large left thyroid nodule, bilateral pulmonary nodules, pancreas body/tail mass with involvement of the celiac axis, peritoneal nodularity, omental caking at the anterior abdominal wall  CT biopsy of an omental nodule 12/12/2018-metastatic adenocarcinoma consistent with pancreas cancer  Cycle 1 gemcitabine/Abraxane 12/22/2018  Cycle 2 gemcitabine/Abraxane 01/05/2019  Common hereditary cancer panel-negative  Cycle3gemcitabine/Abraxane 01/20/2019  Cycle 4 gemcitabine/Abraxane 02/03/2019  Cycle 5 gemcitabine/Abraxane 02/17/2019  CT 02/24/2019- slight decrease in size of the pancreas mass, pleural/pericardial effusions, some lung nodules are larger, others are smaller, areas of omental caking appear improved whereas other areas of omental nodularity have increased  Enrolled in hospice care 2. Abdominal pain secondary to #1-improved 3. Hypertension 4. Hypertrophic obstructive cardiomyopathy 5. Cough-likely secondary to pulmonary metastases 6. Hypercalcemia 12/22/2018 status post Zometa 7. Anorexia secondary to #1 8. Multifocal acute pulmonary emboli diagnosedon CT 01/23/2019-treated with heparin and discharged on Eliquis     Disposition: Ms. Driskill has experienced a slight improvement in her performance status over the past few weeks.  She has enrolled in home hospice care.  The plan is to continue her current medical regimen.  I discussed the case with her daughter by telephone.  Ms. Schuenemann will return for an office visit in 3 weeks.  She will contact us in the interim for new symptoms.  Betsy Coder, MD  03/17/2019  10:49 AM

## 2019-03-21 ENCOUNTER — Telehealth: Payer: Self-pay | Admitting: Oncology

## 2019-03-21 NOTE — Telephone Encounter (Signed)
Called and spoke with patient. Confirmed date and time  °

## 2019-03-22 DIAGNOSIS — R634 Abnormal weight loss: Secondary | ICD-10-CM | POA: Diagnosis not present

## 2019-03-22 DIAGNOSIS — Z681 Body mass index (BMI) 19 or less, adult: Secondary | ICD-10-CM | POA: Diagnosis not present

## 2019-03-22 DIAGNOSIS — C7801 Secondary malignant neoplasm of right lung: Secondary | ICD-10-CM | POA: Diagnosis not present

## 2019-03-22 DIAGNOSIS — C259 Malignant neoplasm of pancreas, unspecified: Secondary | ICD-10-CM | POA: Diagnosis not present

## 2019-03-22 DIAGNOSIS — Z86718 Personal history of other venous thrombosis and embolism: Secondary | ICD-10-CM | POA: Diagnosis not present

## 2019-03-22 DIAGNOSIS — M1990 Unspecified osteoarthritis, unspecified site: Secondary | ICD-10-CM | POA: Diagnosis not present

## 2019-03-22 DIAGNOSIS — C786 Secondary malignant neoplasm of retroperitoneum and peritoneum: Secondary | ICD-10-CM | POA: Diagnosis not present

## 2019-03-22 DIAGNOSIS — E785 Hyperlipidemia, unspecified: Secondary | ICD-10-CM | POA: Diagnosis not present

## 2019-03-22 DIAGNOSIS — Z741 Need for assistance with personal care: Secondary | ICD-10-CM | POA: Diagnosis not present

## 2019-03-22 DIAGNOSIS — C7802 Secondary malignant neoplasm of left lung: Secondary | ICD-10-CM | POA: Diagnosis not present

## 2019-03-22 DIAGNOSIS — I10 Essential (primary) hypertension: Secondary | ICD-10-CM | POA: Diagnosis not present

## 2019-03-22 DIAGNOSIS — E46 Unspecified protein-calorie malnutrition: Secondary | ICD-10-CM | POA: Diagnosis not present

## 2019-03-23 DIAGNOSIS — C7801 Secondary malignant neoplasm of right lung: Secondary | ICD-10-CM | POA: Diagnosis not present

## 2019-03-23 DIAGNOSIS — R634 Abnormal weight loss: Secondary | ICD-10-CM | POA: Diagnosis not present

## 2019-03-23 DIAGNOSIS — C786 Secondary malignant neoplasm of retroperitoneum and peritoneum: Secondary | ICD-10-CM | POA: Diagnosis not present

## 2019-03-23 DIAGNOSIS — E46 Unspecified protein-calorie malnutrition: Secondary | ICD-10-CM | POA: Diagnosis not present

## 2019-03-23 DIAGNOSIS — C259 Malignant neoplasm of pancreas, unspecified: Secondary | ICD-10-CM | POA: Diagnosis not present

## 2019-03-23 DIAGNOSIS — C7802 Secondary malignant neoplasm of left lung: Secondary | ICD-10-CM | POA: Diagnosis not present

## 2019-03-27 ENCOUNTER — Other Ambulatory Visit: Payer: Self-pay | Admitting: Nurse Practitioner

## 2019-03-27 DIAGNOSIS — C251 Malignant neoplasm of body of pancreas: Secondary | ICD-10-CM

## 2019-03-30 DIAGNOSIS — C7801 Secondary malignant neoplasm of right lung: Secondary | ICD-10-CM | POA: Diagnosis not present

## 2019-03-30 DIAGNOSIS — C7802 Secondary malignant neoplasm of left lung: Secondary | ICD-10-CM | POA: Diagnosis not present

## 2019-03-30 DIAGNOSIS — C259 Malignant neoplasm of pancreas, unspecified: Secondary | ICD-10-CM | POA: Diagnosis not present

## 2019-03-30 DIAGNOSIS — R634 Abnormal weight loss: Secondary | ICD-10-CM | POA: Diagnosis not present

## 2019-03-30 DIAGNOSIS — C786 Secondary malignant neoplasm of retroperitoneum and peritoneum: Secondary | ICD-10-CM | POA: Diagnosis not present

## 2019-03-30 DIAGNOSIS — E46 Unspecified protein-calorie malnutrition: Secondary | ICD-10-CM | POA: Diagnosis not present

## 2019-04-01 ENCOUNTER — Other Ambulatory Visit: Payer: Self-pay | Admitting: Interventional Cardiology

## 2019-04-06 DIAGNOSIS — E46 Unspecified protein-calorie malnutrition: Secondary | ICD-10-CM | POA: Diagnosis not present

## 2019-04-06 DIAGNOSIS — C259 Malignant neoplasm of pancreas, unspecified: Secondary | ICD-10-CM | POA: Diagnosis not present

## 2019-04-06 DIAGNOSIS — C7802 Secondary malignant neoplasm of left lung: Secondary | ICD-10-CM | POA: Diagnosis not present

## 2019-04-06 DIAGNOSIS — C7801 Secondary malignant neoplasm of right lung: Secondary | ICD-10-CM | POA: Diagnosis not present

## 2019-04-06 DIAGNOSIS — C786 Secondary malignant neoplasm of retroperitoneum and peritoneum: Secondary | ICD-10-CM | POA: Diagnosis not present

## 2019-04-06 DIAGNOSIS — R634 Abnormal weight loss: Secondary | ICD-10-CM | POA: Diagnosis not present

## 2019-04-07 ENCOUNTER — Inpatient Hospital Stay: Payer: Medicare Other

## 2019-04-07 ENCOUNTER — Inpatient Hospital Stay: Payer: Medicare Other | Attending: Oncology | Admitting: Nurse Practitioner

## 2019-04-07 ENCOUNTER — Other Ambulatory Visit: Payer: Self-pay

## 2019-04-07 ENCOUNTER — Encounter: Payer: Self-pay | Admitting: Nurse Practitioner

## 2019-04-07 VITALS — BP 91/61 | HR 98 | Temp 99.1°F | Resp 18 | Ht 66.0 in | Wt 108.5 lb

## 2019-04-07 DIAGNOSIS — Z95828 Presence of other vascular implants and grafts: Secondary | ICD-10-CM

## 2019-04-07 DIAGNOSIS — R634 Abnormal weight loss: Secondary | ICD-10-CM | POA: Insufficient documentation

## 2019-04-07 DIAGNOSIS — C7800 Secondary malignant neoplasm of unspecified lung: Secondary | ICD-10-CM | POA: Diagnosis not present

## 2019-04-07 DIAGNOSIS — Z79899 Other long term (current) drug therapy: Secondary | ICD-10-CM | POA: Diagnosis not present

## 2019-04-07 DIAGNOSIS — I10 Essential (primary) hypertension: Secondary | ICD-10-CM

## 2019-04-07 DIAGNOSIS — C250 Malignant neoplasm of head of pancreas: Secondary | ICD-10-CM | POA: Diagnosis not present

## 2019-04-07 DIAGNOSIS — C252 Malignant neoplasm of tail of pancreas: Secondary | ICD-10-CM

## 2019-04-07 DIAGNOSIS — C786 Secondary malignant neoplasm of retroperitoneum and peritoneum: Secondary | ICD-10-CM | POA: Insufficient documentation

## 2019-04-07 DIAGNOSIS — C251 Malignant neoplasm of body of pancreas: Secondary | ICD-10-CM | POA: Diagnosis not present

## 2019-04-07 DIAGNOSIS — R531 Weakness: Secondary | ICD-10-CM | POA: Insufficient documentation

## 2019-04-07 MED ORDER — SODIUM CHLORIDE 0.9% FLUSH
10.0000 mL | INTRAVENOUS | Status: DC | PRN
  Administered 2019-04-07: 10:00:00 10 mL
  Filled 2019-04-07: qty 10

## 2019-04-07 MED ORDER — HEPARIN SOD (PORK) LOCK FLUSH 100 UNIT/ML IV SOLN
500.0000 [IU] | Freq: Once | INTRAVENOUS | Status: AC | PRN
  Administered 2019-04-07: 500 [IU]
  Filled 2019-04-07: qty 5

## 2019-04-07 NOTE — Progress Notes (Addendum)
  [facility] [name] OFFICE PROGRESS NOTE   Diagnosis: Pancreas cancer  INTERVAL HISTORY:   Tiffany [name] returns as scheduled.  She is followed by hospice.  No complaint is weakness.  She denies pain.  She reports a good appetite.  No constipation or diarrhea.  Objective:  Vital signs in last 24 hours:  Blood pressure 91/61, pulse 98, temperature 99.1 F (37.3 C), temperature source Temporal, resp. rate 18, height 5\' 6"  (1.676 m), weight 108 lb 8 oz (49.2 kg), SpO2 98 %.    HEENT: No thrush or ulcers. GI: Abdomen soft and nontender.  No hepatomegaly. Vascular: No leg edema. Port-A-Cath without erythema.  Lab Results:  Lab Results  Component Value Date   WBC 5.2 03/03/2019   HGB 12.1 03/03/2019   HCT 37.1 03/03/2019   MCV 95.4 03/03/2019   PLT 275 03/03/2019   NEUTROABS 3.0 03/03/2019    Imaging:  No results found.  Medications: I have reviewed the patient's current medications.  Assessment/Plan: 1. Pancreas mass/lung nodules/peritoneal nodules  Abdominal ultrasound 11/25/2018-1.0 cm pancreatic head cyst and a 5.5 mm lymph node adjacent to the pancreatic head.   MRI abdomen 11/25/2018-irregular poorly marginated hypoenhancing 3.2 x 2.2 cm posterior pancreatic body mass encasing the celiac artery; separate 1.4 cm pancreatic neck and 1.2 cm posterior pancreatic head cystic masses; multiple lung masses/pulmonary nodules scattered throughout both lung bases measuring up to 3.5 cm; extensive peritoneal carcinomatosis with soft tissue metastases throughout the omentum and perihepatic space; trace ascites.  CTs 12/06/2018-large left thyroid nodule, bilateral pulmonary nodules, pancreas body/tail mass with involvement of the celiac axis, peritoneal nodularity, omental caking at the anterior abdominal wall  CT biopsy of an omental nodule 12/12/2018-metastatic adenocarcinoma consistent with pancreas cancer  Cycle 1 gemcitabine/Abraxane 12/22/2018  Cycle 2  gemcitabine/Abraxane 01/05/2019  Common hereditary cancer panel-negative  Cycle3gemcitabine/Abraxane 01/20/2019  Cycle 4 gemcitabine/Abraxane 02/03/2019  Cycle 5 gemcitabine/Abraxane 02/17/2019  CT 02/24/2019- slight decrease in size of the pancreas mass, pleural/pericardial effusions, some lung nodules are larger, others are smaller, areas of omental caking appear improved whereas other areas of omental nodularity have increased  Enrolled in hospice care 2. Abdominal pain secondary to #1-improved 3. Hypertension 4. Hypertrophic obstructive cardiomyopathy 5. Cough-likely secondary to pulmonary metastases 6. Hypercalcemia 12/22/2018 status post Zometa 7. Anorexia secondary to #1 8. Multifocal acute pulmonary emboli diagnosedon CT 01/23/2019-treated with heparin and discharged on Eliquis    Disposition: Tiffany [name] overall condition continues to slowly decline.  She has a poor performance status.  She is enrolled in the [place] hospice program.  Plan to continue supportive/comfort measures.  We discontinued her blood pressure medication due to hypotension in the setting of weight loss.  She will be scheduled for a WebEx appointment with Dr. Benay Spice in 3 to 4 weeks.  She or her daughter will contact the office in the interim with any problems.  Her daughter was on the phone during today's visit.  Patient seen with Dr. Benay Spice.    Ned Card ANP/GNP-BC   04/07/2019  10:45 AM This was a shared visit with Ned Card.  Tiffany [name] appears to have a slow decline in her performance status.  Her daughter was present for today's visit by telephone.  She plans to continue follow-up with the [facility] program.  She will be scheduled for a video visit in 3 weeks.  She will discontinue diltiazem.  Julieanne Manson, MD

## 2019-04-10 ENCOUNTER — Telehealth: Payer: Self-pay | Admitting: Nurse Practitioner

## 2019-04-10 NOTE — Telephone Encounter (Signed)
Scheduled per los. Patient will be contacted about webex

## 2019-04-16 ENCOUNTER — Other Ambulatory Visit: Payer: Self-pay | Admitting: Oncology

## 2019-04-16 DIAGNOSIS — C251 Malignant neoplasm of body of pancreas: Secondary | ICD-10-CM

## 2019-04-19 DIAGNOSIS — E46 Unspecified protein-calorie malnutrition: Secondary | ICD-10-CM | POA: Diagnosis not present

## 2019-04-19 DIAGNOSIS — C7801 Secondary malignant neoplasm of right lung: Secondary | ICD-10-CM | POA: Diagnosis not present

## 2019-04-19 DIAGNOSIS — C259 Malignant neoplasm of pancreas, unspecified: Secondary | ICD-10-CM | POA: Diagnosis not present

## 2019-04-19 DIAGNOSIS — R634 Abnormal weight loss: Secondary | ICD-10-CM | POA: Diagnosis not present

## 2019-04-19 DIAGNOSIS — C786 Secondary malignant neoplasm of retroperitoneum and peritoneum: Secondary | ICD-10-CM | POA: Diagnosis not present

## 2019-04-19 DIAGNOSIS — C7802 Secondary malignant neoplasm of left lung: Secondary | ICD-10-CM | POA: Diagnosis not present

## 2019-04-21 DIAGNOSIS — E46 Unspecified protein-calorie malnutrition: Secondary | ICD-10-CM | POA: Diagnosis not present

## 2019-04-21 DIAGNOSIS — C7802 Secondary malignant neoplasm of left lung: Secondary | ICD-10-CM | POA: Diagnosis not present

## 2019-04-21 DIAGNOSIS — R634 Abnormal weight loss: Secondary | ICD-10-CM | POA: Diagnosis not present

## 2019-04-21 DIAGNOSIS — C786 Secondary malignant neoplasm of retroperitoneum and peritoneum: Secondary | ICD-10-CM | POA: Diagnosis not present

## 2019-04-21 DIAGNOSIS — C259 Malignant neoplasm of pancreas, unspecified: Secondary | ICD-10-CM | POA: Diagnosis not present

## 2019-04-21 DIAGNOSIS — C7801 Secondary malignant neoplasm of right lung: Secondary | ICD-10-CM | POA: Diagnosis not present

## 2019-04-22 DIAGNOSIS — C786 Secondary malignant neoplasm of retroperitoneum and peritoneum: Secondary | ICD-10-CM | POA: Diagnosis not present

## 2019-04-22 DIAGNOSIS — Z741 Need for assistance with personal care: Secondary | ICD-10-CM | POA: Diagnosis not present

## 2019-04-22 DIAGNOSIS — C259 Malignant neoplasm of pancreas, unspecified: Secondary | ICD-10-CM | POA: Diagnosis not present

## 2019-04-22 DIAGNOSIS — E785 Hyperlipidemia, unspecified: Secondary | ICD-10-CM | POA: Diagnosis not present

## 2019-04-22 DIAGNOSIS — R634 Abnormal weight loss: Secondary | ICD-10-CM | POA: Diagnosis not present

## 2019-04-22 DIAGNOSIS — C7801 Secondary malignant neoplasm of right lung: Secondary | ICD-10-CM | POA: Diagnosis not present

## 2019-04-22 DIAGNOSIS — Z86718 Personal history of other venous thrombosis and embolism: Secondary | ICD-10-CM | POA: Diagnosis not present

## 2019-04-22 DIAGNOSIS — M1990 Unspecified osteoarthritis, unspecified site: Secondary | ICD-10-CM | POA: Diagnosis not present

## 2019-04-22 DIAGNOSIS — I10 Essential (primary) hypertension: Secondary | ICD-10-CM | POA: Diagnosis not present

## 2019-04-22 DIAGNOSIS — Z681 Body mass index (BMI) 19 or less, adult: Secondary | ICD-10-CM | POA: Diagnosis not present

## 2019-04-22 DIAGNOSIS — C7802 Secondary malignant neoplasm of left lung: Secondary | ICD-10-CM | POA: Diagnosis not present

## 2019-04-22 DIAGNOSIS — E46 Unspecified protein-calorie malnutrition: Secondary | ICD-10-CM | POA: Diagnosis not present

## 2019-04-27 DIAGNOSIS — C786 Secondary malignant neoplasm of retroperitoneum and peritoneum: Secondary | ICD-10-CM | POA: Diagnosis not present

## 2019-04-27 DIAGNOSIS — C7802 Secondary malignant neoplasm of left lung: Secondary | ICD-10-CM | POA: Diagnosis not present

## 2019-04-27 DIAGNOSIS — C259 Malignant neoplasm of pancreas, unspecified: Secondary | ICD-10-CM | POA: Diagnosis not present

## 2019-04-27 DIAGNOSIS — R634 Abnormal weight loss: Secondary | ICD-10-CM | POA: Diagnosis not present

## 2019-04-27 DIAGNOSIS — E46 Unspecified protein-calorie malnutrition: Secondary | ICD-10-CM | POA: Diagnosis not present

## 2019-04-27 DIAGNOSIS — C7801 Secondary malignant neoplasm of right lung: Secondary | ICD-10-CM | POA: Diagnosis not present

## 2019-04-28 ENCOUNTER — Telehealth: Payer: Self-pay | Admitting: Oncology

## 2019-04-28 DIAGNOSIS — C7801 Secondary malignant neoplasm of right lung: Secondary | ICD-10-CM | POA: Diagnosis not present

## 2019-04-28 DIAGNOSIS — C259 Malignant neoplasm of pancreas, unspecified: Secondary | ICD-10-CM | POA: Diagnosis not present

## 2019-04-28 DIAGNOSIS — C7802 Secondary malignant neoplasm of left lung: Secondary | ICD-10-CM | POA: Diagnosis not present

## 2019-04-28 DIAGNOSIS — E46 Unspecified protein-calorie malnutrition: Secondary | ICD-10-CM | POA: Diagnosis not present

## 2019-04-28 DIAGNOSIS — R634 Abnormal weight loss: Secondary | ICD-10-CM | POA: Diagnosis not present

## 2019-04-28 DIAGNOSIS — C786 Secondary malignant neoplasm of retroperitoneum and peritoneum: Secondary | ICD-10-CM | POA: Diagnosis not present

## 2019-04-28 NOTE — Telephone Encounter (Signed)
Called patient regarding upcoming Webex appointment, per patient's daughter's request time of appointment has been rescheduled for an afternoon time slot. Webex invite has been sent.

## 2019-05-01 ENCOUNTER — Ambulatory Visit: Payer: Medicare Other | Admitting: Oncology

## 2019-05-01 ENCOUNTER — Inpatient Hospital Stay: Payer: Medicare Other | Attending: Oncology | Admitting: Oncology

## 2019-05-01 DIAGNOSIS — G893 Neoplasm related pain (acute) (chronic): Secondary | ICD-10-CM | POA: Diagnosis not present

## 2019-05-01 DIAGNOSIS — C251 Malignant neoplasm of body of pancreas: Secondary | ICD-10-CM

## 2019-05-01 DIAGNOSIS — C7800 Secondary malignant neoplasm of unspecified lung: Secondary | ICD-10-CM

## 2019-05-01 NOTE — Progress Notes (Signed)
[place] OFFICE VISIT PROGRESS NOTE  I connected with@ on 05/01/19 at  3:00 PM EDT by video and verified that I am speaking with the correct person using two identifiers.   I discussed the limitations, risks, security and privacy concerns of performing an evaluation and management service by telemedicine and the availability of in-person appointments. I also discussed with the patient that there may be a patient responsible charge related to this service. The patient expressed understanding and agreed to proceed.  Other persons participating in the visit and their role in the encounter: Daughter  Patient's location: Home Provider's location: Office  Diagnosis: Pancreas cancer  INTERVAL HISTORY:   Ms. Bottari is seen today for a telehealth video visit.  This is an aluminum and in person visit secondary to the [place] pandemic. Ms. Postema is followed by the home hospice team.  She says this has been helpful.  She has increased pain at the lower back and right chest.  She has been taking Ultracet for relief of pain.  She also has an increased cough, mostly in the evening. She stays on the sofa most of the time.  Her appetite has diminished.  No dyspnea.   Medications: I have reviewed the patient's current medications.  Assessment/Plan: 1. Pancreas mass/lung nodules/peritoneal nodules  Abdominal ultrasound 11/25/2018-1.0 cm pancreatic head cyst and a 5.5 mm lymph node adjacent to the pancreatic head.   MRI abdomen 11/25/2018-irregular poorly marginated hypoenhancing 3.2 x 2.2 cm posterior pancreatic body mass encasing the celiac artery; separate 1.4 cm pancreatic neck and 1.2 cm posterior pancreatic head cystic masses; multiple lung masses/pulmonary nodules scattered throughout both lung bases measuring up to 3.5 cm; extensive peritoneal carcinomatosis with soft tissue metastases throughout the omentum and perihepatic space; trace ascites.  CTs  12/06/2018-large left thyroid nodule, bilateral pulmonary nodules, pancreas body/tail mass with involvement of the celiac axis, peritoneal nodularity, omental caking at the anterior abdominal wall  CT biopsy of an omental nodule 12/12/2018-metastatic adenocarcinoma consistent with pancreas cancer  Cycle 1 gemcitabine/Abraxane 12/22/2018  Cycle 2 gemcitabine/Abraxane 01/05/2019  Common hereditary cancer panel-negative  Cycle3gemcitabine/Abraxane 01/20/2019  Cycle 4 gemcitabine/Abraxane 02/03/2019  Cycle 5 gemcitabine/Abraxane 02/17/2019  CT 02/24/2019-slight decrease in size of the pancreas mass, pleural/pericardial effusions, some lung nodules are larger, others are smaller, areas of omental caking appear improved whereas other areas of omental nodularity have increased  Enrolled in hospice care 2. Abdominal pain secondary to #1-improved 3. Hypertension 4. Hypertrophic obstructive cardiomyopathy 5. Cough-likely secondary to pulmonary metastases 6. Hypercalcemia 12/22/2018 status post Zometa 7. Anorexia secondary to #1 8. Multifocal acute pulmonary emboli diagnosedon CT 01/23/2019-treated with heparin and discharged on Eliquis    Disposition: Ms. Paisley has metastatic pancreas cancer.  Her performance status continues to decline.  She will continue Ultracet as needed for pain.  I explained she can use Ultracet in addition to cough medication.  She will continue follow-up with the home hospice RN.  She would like to schedule another video visit for 3 weeks.   I discussed the assessment and treatment plan with the patient. The patient was provided an opportunity to ask questions and all were answered. The patient agreed with the plan and demonstrated an understanding of the instructions.   The patient was advised to call back or seek an in-person evaluation if the symptoms worsen or if the condition fails to improve as anticipated.  I provided 15 minutes of video and documentation time  during this encounter, and >  50% was spent counseling as documented under my assessment & plan.  Betsy Coder ANP/GNP-BC   05/01/2019 3:30 PM

## 2019-05-02 ENCOUNTER — Telehealth: Payer: Self-pay | Admitting: Oncology

## 2019-05-02 NOTE — Telephone Encounter (Signed)
Scheduled per los. Patient will be contacted about webex

## 2019-05-03 DIAGNOSIS — R634 Abnormal weight loss: Secondary | ICD-10-CM | POA: Diagnosis not present

## 2019-05-03 DIAGNOSIS — C786 Secondary malignant neoplasm of retroperitoneum and peritoneum: Secondary | ICD-10-CM | POA: Diagnosis not present

## 2019-05-03 DIAGNOSIS — C259 Malignant neoplasm of pancreas, unspecified: Secondary | ICD-10-CM | POA: Diagnosis not present

## 2019-05-03 DIAGNOSIS — C7802 Secondary malignant neoplasm of left lung: Secondary | ICD-10-CM | POA: Diagnosis not present

## 2019-05-03 DIAGNOSIS — E46 Unspecified protein-calorie malnutrition: Secondary | ICD-10-CM | POA: Diagnosis not present

## 2019-05-03 DIAGNOSIS — C7801 Secondary malignant neoplasm of right lung: Secondary | ICD-10-CM | POA: Diagnosis not present

## 2019-05-04 DIAGNOSIS — C786 Secondary malignant neoplasm of retroperitoneum and peritoneum: Secondary | ICD-10-CM | POA: Diagnosis not present

## 2019-05-04 DIAGNOSIS — C7801 Secondary malignant neoplasm of right lung: Secondary | ICD-10-CM | POA: Diagnosis not present

## 2019-05-04 DIAGNOSIS — R634 Abnormal weight loss: Secondary | ICD-10-CM | POA: Diagnosis not present

## 2019-05-04 DIAGNOSIS — C259 Malignant neoplasm of pancreas, unspecified: Secondary | ICD-10-CM | POA: Diagnosis not present

## 2019-05-04 DIAGNOSIS — C7802 Secondary malignant neoplasm of left lung: Secondary | ICD-10-CM | POA: Diagnosis not present

## 2019-05-04 DIAGNOSIS — E46 Unspecified protein-calorie malnutrition: Secondary | ICD-10-CM | POA: Diagnosis not present

## 2019-05-05 DIAGNOSIS — C7802 Secondary malignant neoplasm of left lung: Secondary | ICD-10-CM | POA: Diagnosis not present

## 2019-05-05 DIAGNOSIS — C7801 Secondary malignant neoplasm of right lung: Secondary | ICD-10-CM | POA: Diagnosis not present

## 2019-05-05 DIAGNOSIS — E46 Unspecified protein-calorie malnutrition: Secondary | ICD-10-CM | POA: Diagnosis not present

## 2019-05-05 DIAGNOSIS — C786 Secondary malignant neoplasm of retroperitoneum and peritoneum: Secondary | ICD-10-CM | POA: Diagnosis not present

## 2019-05-05 DIAGNOSIS — C259 Malignant neoplasm of pancreas, unspecified: Secondary | ICD-10-CM | POA: Diagnosis not present

## 2019-05-05 DIAGNOSIS — R634 Abnormal weight loss: Secondary | ICD-10-CM | POA: Diagnosis not present

## 2019-05-10 ENCOUNTER — Telehealth: Payer: Self-pay | Admitting: *Deleted

## 2019-05-10 ENCOUNTER — Other Ambulatory Visit: Payer: Self-pay | Admitting: Oncology

## 2019-05-10 DIAGNOSIS — C7802 Secondary malignant neoplasm of left lung: Secondary | ICD-10-CM | POA: Diagnosis not present

## 2019-05-10 DIAGNOSIS — E46 Unspecified protein-calorie malnutrition: Secondary | ICD-10-CM | POA: Diagnosis not present

## 2019-05-10 DIAGNOSIS — C7801 Secondary malignant neoplasm of right lung: Secondary | ICD-10-CM | POA: Diagnosis not present

## 2019-05-10 DIAGNOSIS — C259 Malignant neoplasm of pancreas, unspecified: Secondary | ICD-10-CM | POA: Diagnosis not present

## 2019-05-10 DIAGNOSIS — R634 Abnormal weight loss: Secondary | ICD-10-CM | POA: Diagnosis not present

## 2019-05-10 DIAGNOSIS — C786 Secondary malignant neoplasm of retroperitoneum and peritoneum: Secondary | ICD-10-CM | POA: Diagnosis not present

## 2019-05-10 MED ORDER — OXYCODONE HCL 5 MG PO TABS: 5.0000 mg | ORAL_TABLET | ORAL | 0 refills | Status: AC | PRN

## 2019-05-10 NOTE — Telephone Encounter (Addendum)
Has had escalation in her back pain. Not responding to Tramadol or Ultracet (from prior scripts). Hospice asking if she can be started on oxycodone/percocet? Script will need to go to [facility] on [address]. Dr. Benay Spice ordered oxycodone 5 mg every 4 hours prn. Notified hospice RN and she will have family call tomorrow if this does not control her pain.

## 2019-05-17 ENCOUNTER — Telehealth: Payer: Self-pay | Admitting: Oncology

## 2019-05-17 DIAGNOSIS — C7801 Secondary malignant neoplasm of right lung: Secondary | ICD-10-CM | POA: Diagnosis not present

## 2019-05-17 DIAGNOSIS — E46 Unspecified protein-calorie malnutrition: Secondary | ICD-10-CM | POA: Diagnosis not present

## 2019-05-17 DIAGNOSIS — C259 Malignant neoplasm of pancreas, unspecified: Secondary | ICD-10-CM | POA: Diagnosis not present

## 2019-05-17 DIAGNOSIS — C7802 Secondary malignant neoplasm of left lung: Secondary | ICD-10-CM | POA: Diagnosis not present

## 2019-05-17 DIAGNOSIS — R634 Abnormal weight loss: Secondary | ICD-10-CM | POA: Diagnosis not present

## 2019-05-17 DIAGNOSIS — C786 Secondary malignant neoplasm of retroperitoneum and peritoneum: Secondary | ICD-10-CM | POA: Diagnosis not present

## 2019-05-17 NOTE — Telephone Encounter (Signed)
Called patient regarding upcoming Webex appointment, patient is notified and e-mail has been sent. °

## 2019-05-18 ENCOUNTER — Telehealth: Payer: Self-pay | Admitting: Oncology

## 2019-05-18 ENCOUNTER — Inpatient Hospital Stay (HOSPITAL_BASED_OUTPATIENT_CLINIC_OR_DEPARTMENT_OTHER): Payer: Medicare Other | Admitting: Oncology

## 2019-05-18 DIAGNOSIS — C259 Malignant neoplasm of pancreas, unspecified: Secondary | ICD-10-CM | POA: Diagnosis not present

## 2019-05-18 DIAGNOSIS — C7802 Secondary malignant neoplasm of left lung: Secondary | ICD-10-CM | POA: Diagnosis not present

## 2019-05-18 DIAGNOSIS — C7801 Secondary malignant neoplasm of right lung: Secondary | ICD-10-CM | POA: Diagnosis not present

## 2019-05-18 DIAGNOSIS — C251 Malignant neoplasm of body of pancreas: Secondary | ICD-10-CM | POA: Diagnosis not present

## 2019-05-18 DIAGNOSIS — C786 Secondary malignant neoplasm of retroperitoneum and peritoneum: Secondary | ICD-10-CM | POA: Diagnosis not present

## 2019-05-18 DIAGNOSIS — E46 Unspecified protein-calorie malnutrition: Secondary | ICD-10-CM | POA: Diagnosis not present

## 2019-05-18 DIAGNOSIS — R634 Abnormal weight loss: Secondary | ICD-10-CM | POA: Diagnosis not present

## 2019-05-18 NOTE — Telephone Encounter (Signed)
Scheduled per los. Patient will be contacted about Webex

## 2019-05-18 NOTE — Progress Notes (Signed)
[place] OFFICE VISIT PROGRESS NOTE  I connected with Tiffany [name] on 05/18/19 at 10:00 AM EDT by video and verified that I am speaking with the correct person using two identifiers.   I discussed the limitations, risks, security and privacy concerns of performing an evaluation and management service by telemedicine and the availability of in-person appointments. I also discussed with the patient that there may be a patient responsible charge related to this service. The patient expressed understanding and agreed to proceed.  Other persons participating in the visit and their role in the encounter: Daughter  Patient's location: Home Provider's location: Office   Diagnosis: Pancreas cancer  INTERVAL HISTORY:   Tiffany [name] is seen today via video visit.  This is secondary to the COVID pandemic and her medical condition. Her daughter is present for the visit today.  Tiffany [name] stays on the couch most of the day.  She takes Tylenol in the evening and Ultracet every 6 hours for pain relief.  No dyspnea.  She has a cough.  She is having bowel movements.  Her appetite is poor.  She continues weekly visits with the hospice RN. She continues Eliquis anticoagulation.  No bleeding.    Lab Results:  Lab Results  Component Value Date   WBC 5.2 03/03/2019   HGB 12.1 03/03/2019   HCT 37.1 03/03/2019   MCV 95.4 03/03/2019   PLT 275 03/03/2019   NEUTROABS 3.0 03/03/2019    Medications: I have reviewed the patient's current medications.  Assessment/Plan: 1. Pancreas mass/lung nodules/peritoneal nodules  Abdominal ultrasound 11/25/2018-1.0 cm pancreatic head cyst and a 5.5 mm lymph node adjacent to the pancreatic head.   MRI abdomen 11/25/2018-irregular poorly marginated hypoenhancing 3.2 x 2.2 cm posterior pancreatic body mass encasing the celiac artery; separate 1.4 cm pancreatic neck and 1.2 cm posterior pancreatic head cystic masses; multiple  lung masses/pulmonary nodules scattered throughout both lung bases measuring up to 3.5 cm; extensive peritoneal carcinomatosis with soft tissue metastases throughout the omentum and perihepatic space; trace ascites.  CTs 12/06/2018-large left thyroid nodule, bilateral pulmonary nodules, pancreas body/tail mass with involvement of the celiac axis, peritoneal nodularity, omental caking at the anterior abdominal wall  CT biopsy of an omental nodule 12/12/2018-metastatic adenocarcinoma consistent with pancreas cancer  Cycle 1 gemcitabine/Abraxane 12/22/2018  Cycle 2 gemcitabine/Abraxane 01/05/2019  Common hereditary cancer panel-negative  Cycle3gemcitabine/Abraxane 01/20/2019  Cycle 4 gemcitabine/Abraxane 02/03/2019  Cycle 5 gemcitabine/Abraxane 02/17/2019  CT 02/24/2019-slight decrease in size of the pancreas mass, pleural/pericardial effusions, some lung nodules are larger, others are smaller, areas of omental caking appear improved whereas other areas of omental nodularity have increased  Enrolled in hospice care 2. Abdominal pain secondary to #1-improved 3. Hypertension 4. Hypertrophic obstructive cardiomyopathy 5. Cough-likely secondary to pulmonary metastases 6. Hypercalcemia 12/22/2018 status post Zometa 7. Anorexia secondary to #1 8. Multifocal acute pulmonary emboli diagnosedon CT 01/23/2019-treated with heparin and discharged on Eliquis     Disposition: Tiffany [name] appears to be slowly declining.  Her pain appears adequately controlled with the current regimen.  She has oxycodone to use if the pain is not relieved with tramadol.  She will continue weekly visits with the hospice RN.  She will call as needed.  Tiffany [name] would like to continue video visits.  She will be scheduled for a telehealth visit in 4 weeks.   I discussed the assessment and treatment plan with the patient. The patient was provided an opportunity to ask questions and all  were answered. The patient agreed with  the plan and demonstrated an understanding of the instructions.   The patient was advised to call back or seek an in-person evaluation if the symptoms worsen or if the condition fails to improve as anticipated.  I provided 10 minutes of video time during this encounter, and > 50% was spent counseling as documented under my assessment & plan.  Betsy Coder ANP/GNP-BC   05/18/2019 9:52 AM

## 2019-05-19 DIAGNOSIS — C7801 Secondary malignant neoplasm of right lung: Secondary | ICD-10-CM | POA: Diagnosis not present

## 2019-05-19 DIAGNOSIS — E46 Unspecified protein-calorie malnutrition: Secondary | ICD-10-CM | POA: Diagnosis not present

## 2019-05-19 DIAGNOSIS — C7802 Secondary malignant neoplasm of left lung: Secondary | ICD-10-CM | POA: Diagnosis not present

## 2019-05-19 DIAGNOSIS — R634 Abnormal weight loss: Secondary | ICD-10-CM | POA: Diagnosis not present

## 2019-05-19 DIAGNOSIS — C259 Malignant neoplasm of pancreas, unspecified: Secondary | ICD-10-CM | POA: Diagnosis not present

## 2019-05-19 DIAGNOSIS — C786 Secondary malignant neoplasm of retroperitoneum and peritoneum: Secondary | ICD-10-CM | POA: Diagnosis not present

## 2019-05-23 DIAGNOSIS — Z86718 Personal history of other venous thrombosis and embolism: Secondary | ICD-10-CM | POA: Diagnosis not present

## 2019-05-23 DIAGNOSIS — C7802 Secondary malignant neoplasm of left lung: Secondary | ICD-10-CM | POA: Diagnosis not present

## 2019-05-23 DIAGNOSIS — I10 Essential (primary) hypertension: Secondary | ICD-10-CM | POA: Diagnosis not present

## 2019-05-23 DIAGNOSIS — E785 Hyperlipidemia, unspecified: Secondary | ICD-10-CM | POA: Diagnosis not present

## 2019-05-23 DIAGNOSIS — M1990 Unspecified osteoarthritis, unspecified site: Secondary | ICD-10-CM | POA: Diagnosis not present

## 2019-05-23 DIAGNOSIS — C786 Secondary malignant neoplasm of retroperitoneum and peritoneum: Secondary | ICD-10-CM | POA: Diagnosis not present

## 2019-05-23 DIAGNOSIS — C7801 Secondary malignant neoplasm of right lung: Secondary | ICD-10-CM | POA: Diagnosis not present

## 2019-05-23 DIAGNOSIS — Z741 Need for assistance with personal care: Secondary | ICD-10-CM | POA: Diagnosis not present

## 2019-05-23 DIAGNOSIS — C259 Malignant neoplasm of pancreas, unspecified: Secondary | ICD-10-CM | POA: Diagnosis not present

## 2019-05-23 DIAGNOSIS — Z681 Body mass index (BMI) 19 or less, adult: Secondary | ICD-10-CM | POA: Diagnosis not present

## 2019-05-23 DIAGNOSIS — E46 Unspecified protein-calorie malnutrition: Secondary | ICD-10-CM | POA: Diagnosis not present

## 2019-05-23 DIAGNOSIS — R634 Abnormal weight loss: Secondary | ICD-10-CM | POA: Diagnosis not present

## 2019-05-24 DIAGNOSIS — E46 Unspecified protein-calorie malnutrition: Secondary | ICD-10-CM | POA: Diagnosis not present

## 2019-05-24 DIAGNOSIS — C786 Secondary malignant neoplasm of retroperitoneum and peritoneum: Secondary | ICD-10-CM | POA: Diagnosis not present

## 2019-05-24 DIAGNOSIS — R634 Abnormal weight loss: Secondary | ICD-10-CM | POA: Diagnosis not present

## 2019-05-24 DIAGNOSIS — C259 Malignant neoplasm of pancreas, unspecified: Secondary | ICD-10-CM | POA: Diagnosis not present

## 2019-05-24 DIAGNOSIS — C7801 Secondary malignant neoplasm of right lung: Secondary | ICD-10-CM | POA: Diagnosis not present

## 2019-05-24 DIAGNOSIS — C7802 Secondary malignant neoplasm of left lung: Secondary | ICD-10-CM | POA: Diagnosis not present

## 2019-05-26 DIAGNOSIS — C786 Secondary malignant neoplasm of retroperitoneum and peritoneum: Secondary | ICD-10-CM | POA: Diagnosis not present

## 2019-05-26 DIAGNOSIS — C7802 Secondary malignant neoplasm of left lung: Secondary | ICD-10-CM | POA: Diagnosis not present

## 2019-05-26 DIAGNOSIS — R634 Abnormal weight loss: Secondary | ICD-10-CM | POA: Diagnosis not present

## 2019-05-26 DIAGNOSIS — C7801 Secondary malignant neoplasm of right lung: Secondary | ICD-10-CM | POA: Diagnosis not present

## 2019-05-26 DIAGNOSIS — E46 Unspecified protein-calorie malnutrition: Secondary | ICD-10-CM | POA: Diagnosis not present

## 2019-05-26 DIAGNOSIS — C259 Malignant neoplasm of pancreas, unspecified: Secondary | ICD-10-CM | POA: Diagnosis not present

## 2019-06-02 ENCOUNTER — Telehealth: Payer: Self-pay | Admitting: *Deleted

## 2019-06-02 NOTE — Telephone Encounter (Signed)
Daughter, [name] left VM that her mother died on June 05, 2019. Reports "she went to sleep and never woke up". Wanted to thank Dr. Benay Spice and his staff for excellent care of her mother.

## 2019-06-14 ENCOUNTER — Ambulatory Visit: Payer: Federal, State, Local not specified - PPO | Admitting: Oncology

## 2019-06-22 DEATH — deceased

## 2020-07-09 IMAGING — CT CT ABDOMEN AND PELVIS WITH CONTRAST
2 of 9 series · 13 of 46 positions shown, 15 images · IV contrast (OMNIPAQUE)
Comparison: MRI abdomen dated 11/25/2018

CLINICAL DATA: Pancreatic cancer, cough, weight loss

EXAM:
CT CHEST, ABDOMEN, AND PELVIS WITH CONTRAST
TECHNIQUE: Multidetector CT imaging of the chest, abdomen and pelvis was
performed following the standard protocol during bolus
administration of intravenous contrast.
CONTRAST:  100mL OMNIPAQUE IOHEXOL 300 MG/ML  SOLN

[Series 3: coronal arterial · coronal · arterial · 0.46mm/px · 3 of 97 slices shown]
[im 25/97  soft-tissue]
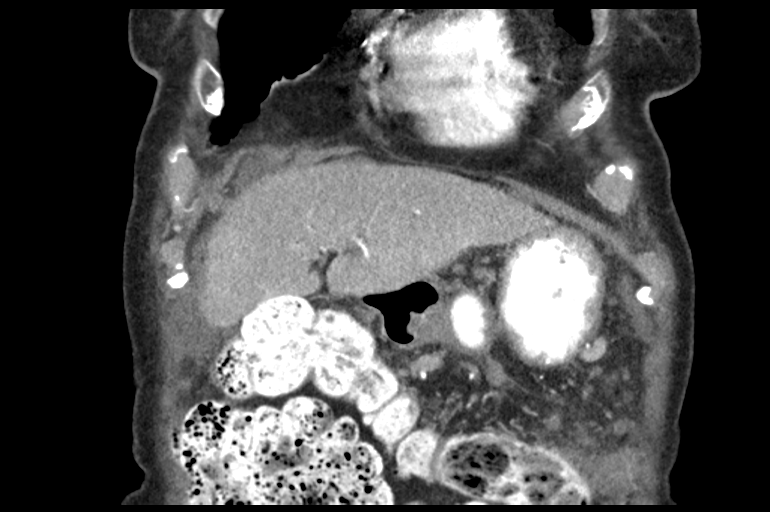
[im 49/97  soft-tissue]
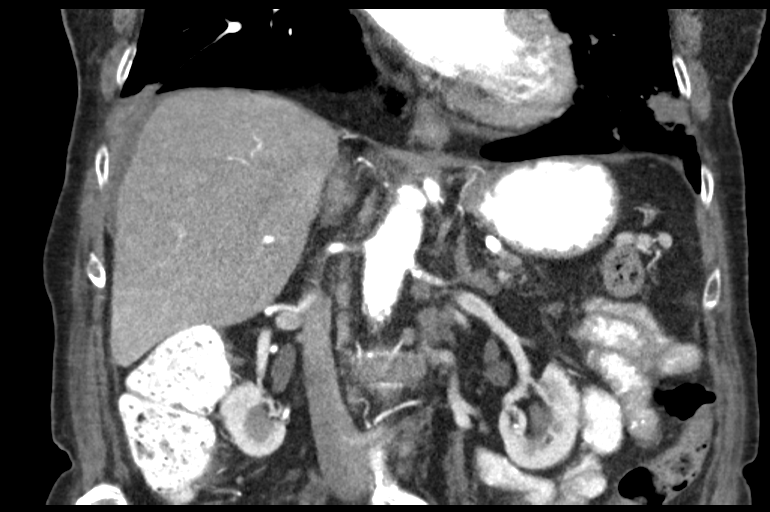
[im 73/97  soft-tissue]
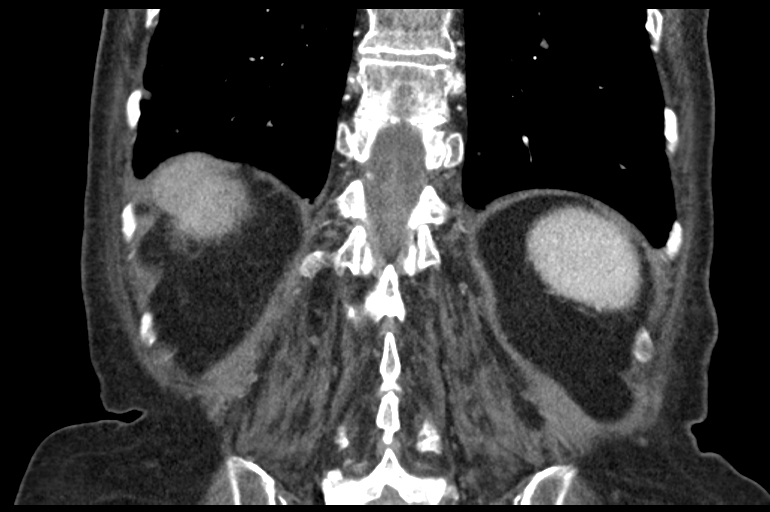

[Series 7: axial venous · axial · portal-venous · 0.78mm/px · z∈[-585,-57]mm · 10 of 216 slices shown, 12 images]
[im 20/216  soft-tissue]
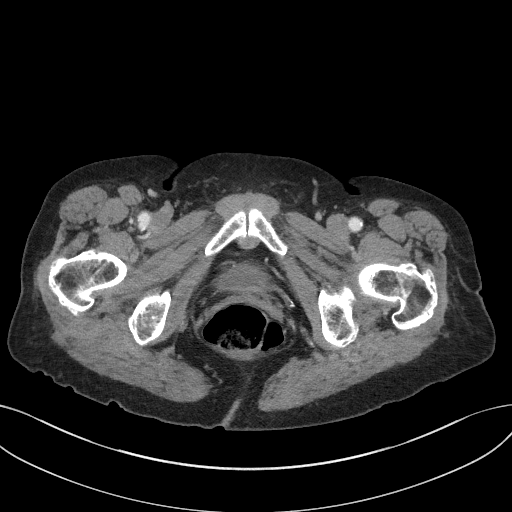
[im 20/216  bone]
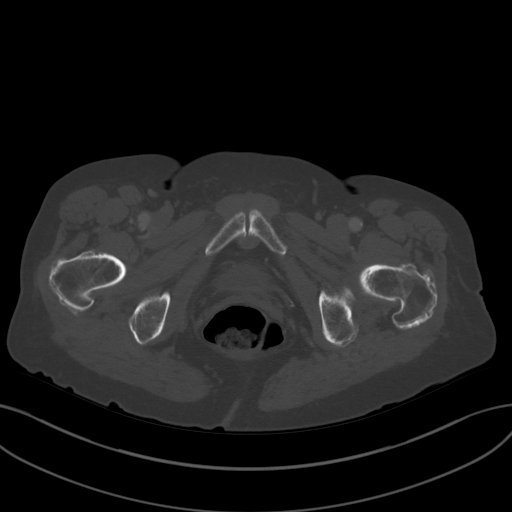
[im 40/216  soft-tissue]
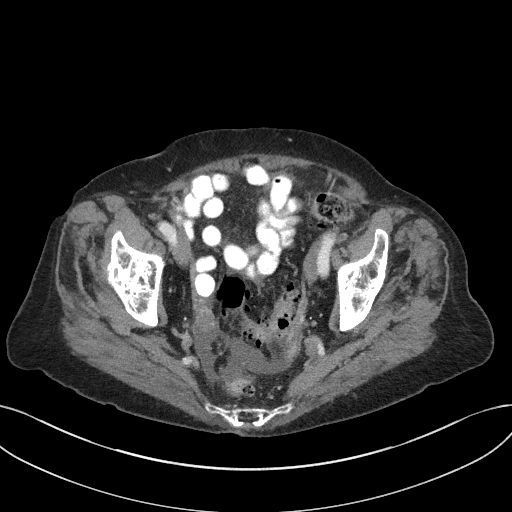
[im 59/216  soft-tissue]
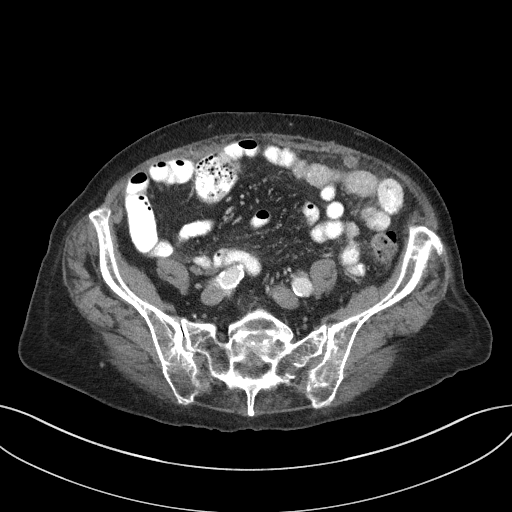
[im 79/216  soft-tissue]
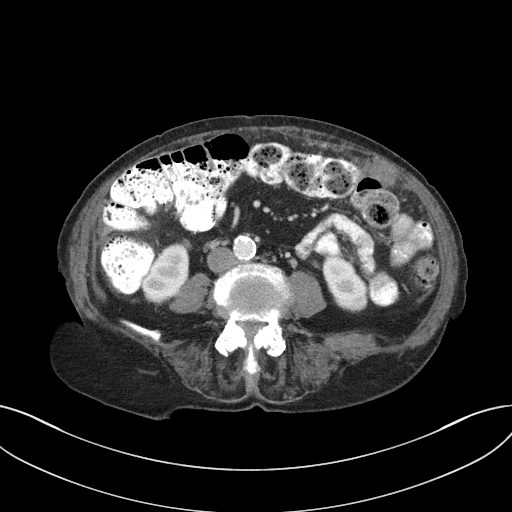
[im 98/216  soft-tissue]
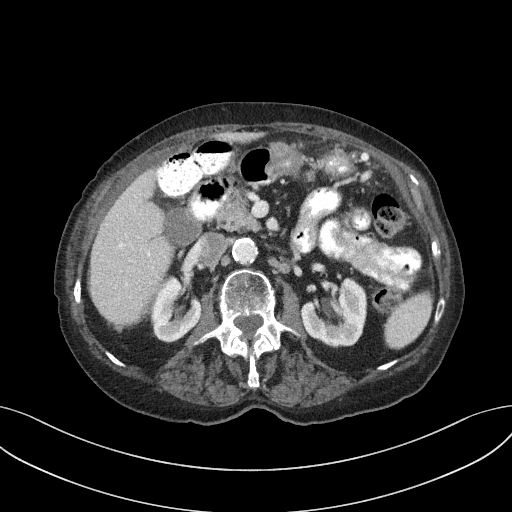
[im 118/216  soft-tissue]
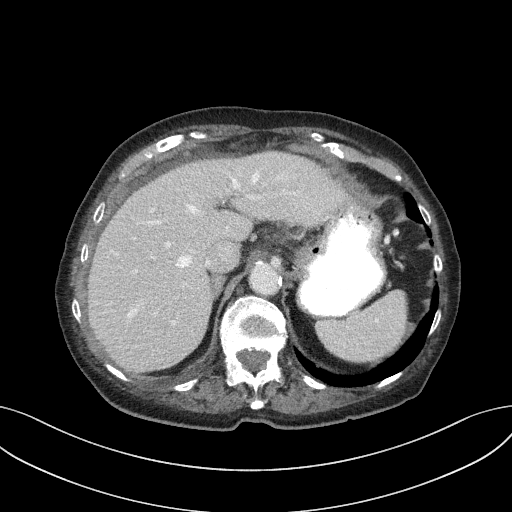
[im 137/216  soft-tissue]
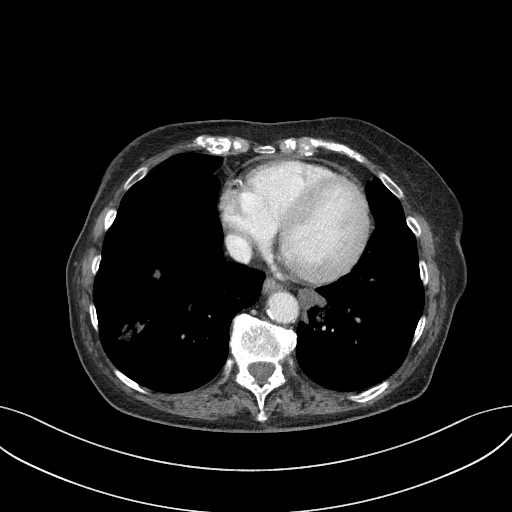
[im 157/216  soft-tissue]
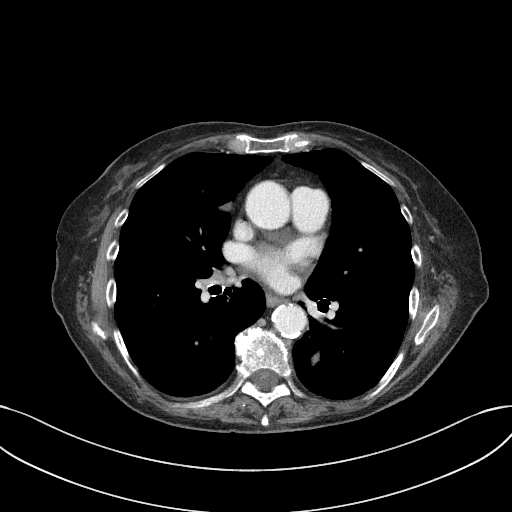
[im 176/216  soft-tissue]
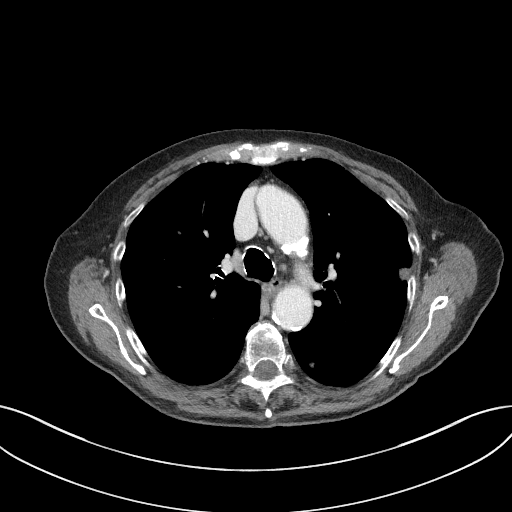
[im 176/216  bone]
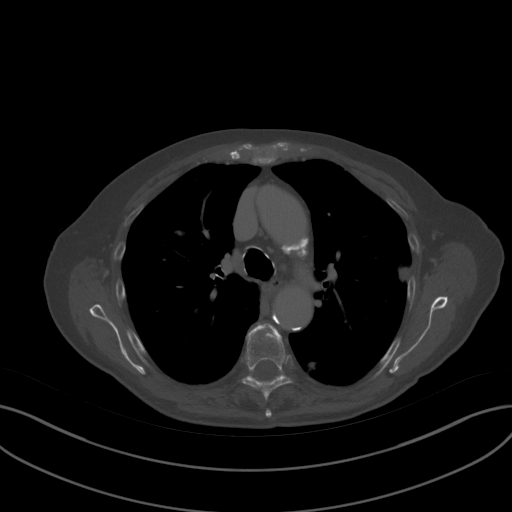
[im 196/216  soft-tissue]
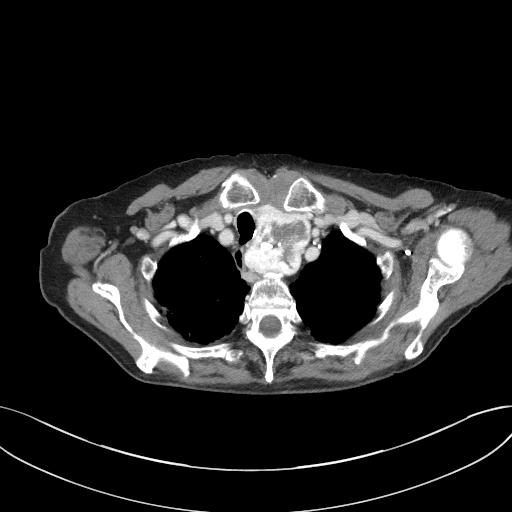

[13 of 46 positions shown; findings below may reference images not displayed]

FINDINGS: CT CHEST FINDINGS

Cardiovascular: The heart is normal in size. No pericardial
effusion.

No evidence thoracic aortic aneurysm. Atherosclerotic calcifications
of the aortic arch.

Coronary atherosclerosis the LAD and right coronary artery.

Mediastinum/Nodes: No suspicious mediastinal, hilar, or axillary
lymphadenopathy.

Large left thyroid nodule measuring at least 8.1 cm (coronal image
45).

Lungs/Pleura: Numerous bilateral pulmonary nodules/masses, highly
suspicious for metastatic disease in this patient. Dominant/index
lesions include:

--1.9 cm nodule in the posterior left upper lobe (series 12/image
68)

--2.2 cm nodule in the posterior right upper lobe (series 12/image
76)

--3.0 cm mass in the posterior right lower lobe (series 12/image
116)

--3.5 cm mass in the lateral left lower lobe (series 12/image 137)

Biapical pleural-parenchymal scarring.

No focal consolidation.

No pleural effusion or pneumothorax.

Musculoskeletal: Mild degenerative changes of the thoracic spine.

CT ABDOMEN PELVIS FINDINGS

Hepatobiliary: Liver is within normal limits.

Gallbladder is unremarkable. No intrahepatic or extrahepatic ductal
dilatation.

Pancreas: 2.1 x 3.3 cm mass along the posterior aspect of the
pancreatic body/tail (series 2/image 38), corresponding to the
patient's known pancreatic cancer. Associated involvement of the
celiac axis. No pancreatic ductal dilatation or atrophy. Additional
13 mm fluid density lesion in the pancreatic head.

Spleen: Within normal limits.

Adrenals/Urinary Tract: 1.8 cm right adrenal nodule, characterized
as a benign adrenal adenoma on MR. Nurmemmed adrenal gland is within
normal limits.

Kidneys are within normal limits.  No hydronephrosis.

Bladder is underdistended but unremarkable.

Stomach/Bowel: Stomach is within normal limits.

No evidence of bowel obstruction.

Normal appendix (series 7/image 161).

Sigmoid diverticulosis, without evidence of diverticulitis.

Vascular/Lymphatic: No evidence of abdominal aortic aneurysm.

Atherosclerotic calcifications of the abdominal aorta and branch
vessels.

Involvement of the celiac axis, as described above. Occlusion of the
splenic vein.

Dilated thoracic duct in the right retrocrural space (series 7/image
92). No suspicious abdominopelvic lymphadenopathy.

Reproductive: Status post hysterectomy.

Bilateral ovaries are unremarkable.

Other: Small volume pelvic ascites, with peritoneal
thickening/nodularity, malignant.

Associated peritoneal disease/omental caking beneath the anterior
abdominal wall (for example, series 7/image 136).

Musculoskeletal: Moderate compression fracture deformity at L1. No
retropulsion.
IMPRESSION: 3.3 cm mass along the posterior aspect of the pancreatic body/tail,
corresponding to the patient's known pancreatic cancer. Associated
involvement of the celiac axis and occlusion of the splenic vein.

Associated peritoneal disease/omental caking, as above. Trace pelvic
ascites, malignant.

Widespread/numerous pulmonary metastases, with index lesions as
above.

Large left thyroid mass, measuring 8.1 cm, of questionable clinical
significance given the additional findings.
